# Patient Record
Sex: Female | Born: 1935 | Race: Black or African American | Hispanic: No | Smoking: Never smoker
Health system: Southern US, Community
[De-identification: ages and names within clinical notes are randomized; demographics above are authoritative.]

## PROBLEM LIST (undated history)

## (undated) DIAGNOSIS — F329 Major depressive disorder, single episode, unspecified: Secondary | ICD-10-CM

## (undated) DIAGNOSIS — K5900 Constipation, unspecified: Secondary | ICD-10-CM

## (undated) DIAGNOSIS — N183 Chronic kidney disease, stage 3 unspecified: Secondary | ICD-10-CM

## (undated) DIAGNOSIS — I471 Supraventricular tachycardia: Secondary | ICD-10-CM

## (undated) DIAGNOSIS — Z87440 Personal history of urinary (tract) infections: Secondary | ICD-10-CM

## (undated) DIAGNOSIS — I442 Atrioventricular block, complete: Secondary | ICD-10-CM

## (undated) DIAGNOSIS — Z91148 Patient's other noncompliance with medication regimen for other reason: Secondary | ICD-10-CM

## (undated) DIAGNOSIS — I779 Disorder of arteries and arterioles, unspecified: Secondary | ICD-10-CM

## (undated) DIAGNOSIS — I5032 Chronic diastolic (congestive) heart failure: Secondary | ICD-10-CM

## (undated) DIAGNOSIS — R9430 Abnormal result of cardiovascular function study, unspecified: Secondary | ICD-10-CM

## (undated) DIAGNOSIS — I4719 Other supraventricular tachycardia: Secondary | ICD-10-CM

## (undated) DIAGNOSIS — I739 Peripheral vascular disease, unspecified: Secondary | ICD-10-CM

## (undated) DIAGNOSIS — I10 Essential (primary) hypertension: Secondary | ICD-10-CM

## (undated) DIAGNOSIS — K219 Gastro-esophageal reflux disease without esophagitis: Secondary | ICD-10-CM

## (undated) DIAGNOSIS — E785 Hyperlipidemia, unspecified: Secondary | ICD-10-CM

## (undated) DIAGNOSIS — IMO0002 Reserved for concepts with insufficient information to code with codable children: Secondary | ICD-10-CM

## (undated) DIAGNOSIS — F32A Depression, unspecified: Secondary | ICD-10-CM

## (undated) DIAGNOSIS — I2510 Atherosclerotic heart disease of native coronary artery without angina pectoris: Secondary | ICD-10-CM

## (undated) DIAGNOSIS — I639 Cerebral infarction, unspecified: Secondary | ICD-10-CM

## (undated) DIAGNOSIS — Z9114 Patient's other noncompliance with medication regimen: Secondary | ICD-10-CM

## (undated) DIAGNOSIS — I340 Nonrheumatic mitral (valve) insufficiency: Secondary | ICD-10-CM

## (undated) DIAGNOSIS — R609 Edema, unspecified: Secondary | ICD-10-CM

## (undated) DIAGNOSIS — R0602 Shortness of breath: Secondary | ICD-10-CM

## (undated) DIAGNOSIS — Z8669 Personal history of other diseases of the nervous system and sense organs: Secondary | ICD-10-CM

## (undated) DIAGNOSIS — I951 Orthostatic hypotension: Secondary | ICD-10-CM

## (undated) HISTORY — DX: Orthostatic hypotension: I95.1

## (undated) HISTORY — DX: Edema, unspecified: R60.9

## (undated) HISTORY — DX: Hyperlipidemia, unspecified: E78.5

## (undated) HISTORY — DX: Peripheral vascular disease, unspecified: I73.9

## (undated) HISTORY — DX: Shortness of breath: R06.02

## (undated) HISTORY — DX: Gastro-esophageal reflux disease without esophagitis: K21.9

## (undated) HISTORY — DX: Personal history of urinary (tract) infections: Z87.440

## (undated) HISTORY — DX: Chronic kidney disease, stage 3 unspecified: N18.30

## (undated) HISTORY — DX: Major depressive disorder, single episode, unspecified: F32.9

## (undated) HISTORY — DX: Patient's other noncompliance with medication regimen: Z91.14

## (undated) HISTORY — DX: Depression, unspecified: F32.A

## (undated) HISTORY — DX: Chronic diastolic (congestive) heart failure: I50.32

## (undated) HISTORY — DX: Cerebral infarction, unspecified: I63.9

## (undated) HISTORY — DX: Chronic kidney disease, stage 3 (moderate): N18.3

## (undated) HISTORY — DX: Personal history of other diseases of the nervous system and sense organs: Z86.69

## (undated) HISTORY — DX: Abnormal result of cardiovascular function study, unspecified: R94.30

## (undated) HISTORY — PX: PACEMAKER INSERTION: SHX728

## (undated) HISTORY — DX: Essential (primary) hypertension: I10

## (undated) HISTORY — DX: Patient's other noncompliance with medication regimen for other reason: Z91.148

## (undated) HISTORY — DX: Constipation, unspecified: K59.00

## (undated) HISTORY — DX: Disorder of arteries and arterioles, unspecified: I77.9

## (undated) HISTORY — DX: Reserved for concepts with insufficient information to code with codable children: IMO0002

---

## 2002-02-27 ENCOUNTER — Encounter: Payer: Self-pay | Admitting: Internal Medicine

## 2002-02-28 ENCOUNTER — Ambulatory Visit (HOSPITAL_COMMUNITY): Admission: RE | Admit: 2002-02-28 | Discharge: 2002-02-28 | Payer: Self-pay | Admitting: Internal Medicine

## 2002-02-28 ENCOUNTER — Encounter: Payer: Self-pay | Admitting: Internal Medicine

## 2002-06-11 ENCOUNTER — Ambulatory Visit (HOSPITAL_COMMUNITY): Admission: RE | Admit: 2002-06-11 | Discharge: 2002-06-11 | Payer: Self-pay | Admitting: Ophthalmology

## 2005-11-03 ENCOUNTER — Encounter: Admission: RE | Admit: 2005-11-03 | Discharge: 2005-11-03 | Payer: Self-pay | Admitting: General Surgery

## 2005-11-03 ENCOUNTER — Encounter (INDEPENDENT_AMBULATORY_CARE_PROVIDER_SITE_OTHER): Payer: Self-pay | Admitting: *Deleted

## 2006-12-14 ENCOUNTER — Encounter: Admission: RE | Admit: 2006-12-14 | Discharge: 2006-12-14 | Payer: Self-pay | Admitting: General Surgery

## 2008-01-01 ENCOUNTER — Encounter: Admission: RE | Admit: 2008-01-01 | Discharge: 2008-01-01 | Payer: Self-pay | Admitting: Family Medicine

## 2010-03-23 ENCOUNTER — Ambulatory Visit: Payer: Self-pay | Admitting: Cardiology

## 2011-02-25 NOTE — Procedures (Signed)
[facility]  Patient:    Becky Collins, Becky Collins Visit Number: [id number] MRN: [id number]          Service Type: OUT Location: RAD Attending Physician:  Pricilla Riffle Dictated by:   Vania Rea. Rinehuls, P.A. Proc. Date: 02/28/02 Admit Date:  02/28/2002 Discharge Date: 02/28/2002   CC:         Dr. Carlynn Spry, [place]   Stress Test  DATE OF BIRTH:  November 01, 1931  PROCEDURE:  Exercise Cardiolite stress test.  CARDIOLOGIST:  Dietrich Pates, M.D.  INDICATIONS:  This is a 77 year old female with a history of a fast heart rate.  She states she wore a monitor approximately one year ago for an evaluation, but there were no significant findings.  She has a history of diabetes mellitus, hypertension, elevated cholesterol levels, and a positive family history for coronary artery disease.  She has had occasional chest pain for three to four years.  She notices the pain mainly when she is upset about family issues or other stresses in her life.  She is able to talk on a treadmill at home without pain, although she does become tired quickly.  PAST MEDICAL HISTORY:  Significant for seizures.  Her last one was four to five years ago.  She has a history of renal calculi.  Other history as noted above.  ALLERGIES:  No known drug allergies.  CURRENT MEDICATIONS:  The patient is not sure of her medications.  She believes she is on Depakote, aspirin, Glucophage, and a blood pressure pill.  FAMILY HISTORY:  The patients father died in his 16s from a myocardial infarction and a CVA.  Her mother died from a CVA in her 70s.  She has 18 brothers and sisters, three of whom have significant coronary artery disease.  PHYSICAL EXAMINATION  GENERAL:  A pleasant 77 year old black female, in no acute distress.  VITAL SIGNS:  Blood pressure 140/80, pulse 93.  HEENT:  Unremarkable.  NECK:  No bruits, no jugular venous distention.  HEART:  A regular rate and rhythm without  murmur.  LUNGS:  Clear.  ABDOMEN:  Soft, nontender.  EXTREMITIES:  No edema.  SOCIAL HISTORY:  The patient is married.  She lives in [place].  She has four children.  She cares for other family members who are ill.  She walks on a treadmill as well as walks outside for exercise.  She has never used tobacco or alcohol.  Prior to the study today, the patient had no complaints of chest pain.  Her baseline electrocardiogram showed a sinus rhythm, rate 93 beats per minute, with nonspecific changes.  Her blood pressure was 140/88, pulse 93, target heart rate 130.  DESCRIPTION OF PROCEDURE:  The patient was able to exercise for a total of three minutes, reaching a maximum heart rate of 144 beats per minute.  She was injected at one minute and 30 seconds into the study, at which time her heart rate was 135.  She had no chest tightness.  She did feel tired.  There were no significant electrocardiogram changes, except for occasional PVCs, and while in recovery she developed an abnormal rhythm that appeared to possibly be an accelerated junctional rhythm.  This appeared to be resolving in recovery. The patient was asymptomatic with this rhythm.  Final images are pending at the time of this dictation. Dictated by:   Vania Rea. Rinehuls, P.A. Attending Physician:  Pricilla Riffle DD:  02/28/02 TD:  03/02/02 Job: [id number]

## 2011-08-12 DIAGNOSIS — R072 Precordial pain: Secondary | ICD-10-CM

## 2011-08-12 DIAGNOSIS — R0602 Shortness of breath: Secondary | ICD-10-CM

## 2011-08-12 DIAGNOSIS — R9431 Abnormal electrocardiogram [ECG] [EKG]: Secondary | ICD-10-CM

## 2011-09-13 ENCOUNTER — Encounter: Payer: Self-pay | Admitting: *Deleted

## 2011-09-13 ENCOUNTER — Encounter: Payer: Medicare Other | Admitting: Cardiology

## 2011-09-15 ENCOUNTER — Ambulatory Visit (INDEPENDENT_AMBULATORY_CARE_PROVIDER_SITE_OTHER): Payer: Medicare Other | Admitting: Physician Assistant

## 2011-09-15 ENCOUNTER — Encounter: Payer: Self-pay | Admitting: Cardiology

## 2011-09-15 DIAGNOSIS — I779 Disorder of arteries and arterioles, unspecified: Secondary | ICD-10-CM | POA: Insufficient documentation

## 2011-09-15 DIAGNOSIS — I499 Cardiac arrhythmia, unspecified: Secondary | ICD-10-CM

## 2011-09-15 DIAGNOSIS — I10 Essential (primary) hypertension: Secondary | ICD-10-CM

## 2011-09-15 DIAGNOSIS — E785 Hyperlipidemia, unspecified: Secondary | ICD-10-CM

## 2011-09-15 DIAGNOSIS — E119 Type 2 diabetes mellitus without complications: Secondary | ICD-10-CM

## 2011-09-15 NOTE — Assessment & Plan Note (Signed)
No evidence of recurrent higher grade heart block (Mobitz 1), by current EKG, which indicates underlying first-degree AV block. Would continue current medication regimen, and not resume verapamil. No further workup for dysrhythmia, and we'll schedule return visit with Dr. Andee Lineman in 6 months.

## 2011-09-15 NOTE — Assessment & Plan Note (Signed)
Followed by Dr. Howard. 

## 2011-09-15 NOTE — Progress Notes (Signed)
Cc: Dysrhythmia   HPI: Patient presents for post hospital followup, following recent evaluation for possible CHB. She presented with no known history of CAD, but numerous cardiac risk factors, and history of normal LVF and prior negative stress tests.  She did not have true third degree AVB, but rather was in a second-degree Mobitz 1 pattern. We recommended discontinuing verapamil. 2-D echo indicated normal LVF. We also recommended a nuclear imaging study for risk stratification. Although this yielded no definite evidence of ischemia, there was a medium sized fixed defect, consistent with prior MI; EF 54%. We also ordered carotid Dopplers, given prior history of mild disease, and these yielded 50-69% bilateral ICA (L. greater R.) stenosis, somewhat more pronounced than in previous study, in 2006. Serial cardiac markers were negative for MI, TSH was normal.  Clinically, she denies exertional CP; however, she continues to have exertional dyspnea. She denies taking palpitations.  PMH: reviewed and listed in Problem List in electronic Records (and see below)  Allergies/SH/FH: available in Electronic Records for review  Current Outpatient Prescriptions  Medication Sig Dispense Refill  . aspirin 81 MG tablet Take 81 mg by mouth daily.        . divalproex (DEPAKOTE ER) 250 MG 24 hr tablet Take 250 mg by mouth 2 (two) times daily.        . furosemide (LASIX) 40 MG tablet Take 40 mg by mouth daily.        Marland Kitchen glipiZIDE (GLUCOTROL) 10 MG tablet Take 20 mg by mouth 2 (two) times daily before a meal.       . insulin glargine (LANTUS) 100 UNIT/ML injection Inject into the skin at bedtime.        Marland Kitchen lisinopril (PRINIVIL,ZESTRIL) 20 MG tablet Take 20 mg by mouth daily.        Marland Kitchen LORazepam (ATIVAN) 0.5 MG tablet Take 0.5 mg by mouth every 8 (eight) hours.        Marland Kitchen omeprazole (PRILOSEC) 20 MG capsule Take 20 mg by mouth daily.        . sertraline (ZOLOFT) 50 MG tablet Take 50 mg by mouth daily.        .  Sulfamethoxazole-Trimethoprim (SEPTRA PO) Take 1 tablet by mouth daily.          ROS: no nausea, vomiting; no fever, chills; no melena, hematochezia; no claudication  PHYSICAL EXAM:  BP 150/73  Pulse 100  Resp 16  Ht 5\' 6"  (1.676 m)  Wt 204 lb (92.534 kg)  BMI 32.93 kg/m2 GENERAL: 77 year old female, obese, sitting upright; NAD HEENT: NCAT, PERRLA, EOMI; sclera clear; no xanthelasma NECK: palpable bilateral carotid pulses, bilateral bruits (L. greater than R.); no JVD; no TM LUNGS: CTA bilaterally CARDIAC: RRR (S1, S2); no significant murmurs; no rubs or gallops ABDOMEN: soft, non-tender; intact BS EXTREMETIES: intact distal pulses; no significant peripheral edema SKIN: warm/dry; no obvious rash/lesions MUSCULOSKELETAL: no joint deformity NEURO: no focal deficit; NL affect   EKG: reviewed and available in Electronic Records   ASSESSMENT & PLAN:

## 2011-09-15 NOTE — Patient Instructions (Signed)
Your physician wants you to follow-up in: 6 months. You will receive a reminder letter in the mail one-two months in advance. If you don't receive a letter, please call our office to schedule the follow-up appointment. Your physician recommends that you continue on your current medications as directed. Please refer to the Current Medication list given to you today. 

## 2012-05-07 ENCOUNTER — Encounter: Payer: Self-pay | Admitting: Vascular Surgery

## 2012-05-08 ENCOUNTER — Encounter: Payer: Self-pay | Admitting: Vascular Surgery

## 2012-05-08 ENCOUNTER — Ambulatory Visit (INDEPENDENT_AMBULATORY_CARE_PROVIDER_SITE_OTHER): Payer: Medicare Other | Admitting: Vascular Surgery

## 2012-05-08 VITALS — BP 112/70 | HR 99 | Resp 16 | Ht 65.0 in | Wt 189.0 lb

## 2012-05-08 DIAGNOSIS — I70219 Atherosclerosis of native arteries of extremities with intermittent claudication, unspecified extremity: Secondary | ICD-10-CM

## 2012-05-08 NOTE — Progress Notes (Signed)
The patient has today for evaluation of potential arterial insufficiency in her left foot. She had an episode of erythema in her great toe on the left and pain and is seen for further evaluation. She does walk but is somewhat limited in her ability to get around for multiple other comorbidities. She does have a constant pain in her feet more so on the left than on the right she describes as in her heel but does extend up into her leg as well. She does not have any claudication type symptoms.  Past Medical History  Diagnosis Date  . Hypertension     moderate left ventricular hypertrophy  . Diabetes mellitus     h/o negative exercise stress test/normal LVF assessed June 2011 by echo EF 60-65%  . Hyperlipidemia   . Carotid artery disease     with less than 60% stenosis on the right in 2006  . History of seizure disorder     on depakote  . History of recurrent UTIs   . Chronic edema   . Depression   . Acid reflux disease   . History of medication noncompliance     this has been ongoing for quite sometime  . 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 Dysrhythmia     Second-degree AVB Mobitz 1, 11/12; history of PAT  . Chronic kidney disease   . Stroke     History  Substance Use Topics  . Smoking status: Never Smoker   . Smokeless tobacco: Never Used  . Alcohol Use: No    Family History  Problem Relation Age of Onset  . Heart attack Father   . Heart attack Mother   . Diabetes Sister   . Hypertension Sister   . Peripheral vascular disease Sister   . Diabetes Brother   . Heart disease Brother   . Hypertension Brother     No Known Allergies  Current outpatient prescriptions:aspirin 81 MG tablet, Take 81 mg by mouth daily.  , Disp: , Rfl: ;  divalproex (DEPAKOTE ER) 250 MG 24 hr tablet, Take 250 mg by mouth 2 (two) times daily.  , Disp: , Rfl: ;  furosemide (LASIX) 40 MG tablet, Take 40 mg by mouth daily.  , Disp: , Rfl: ;  gabapentin (NEURONTIN) 100 MG  capsule, Take 100 mg by mouth daily., Disp: , Rfl:  glyBURIDE (DIABETA) 5 MG tablet, Take 5 mg by mouth daily with breakfast., Disp: , Rfl: ;  insulin glargine (LANTUS) 100 UNIT/ML injection, Inject into the skin at bedtime. As directed, Disp: , Rfl: ;  lisinopril (PRINIVIL,ZESTRIL) 20 MG tablet, Take 20 mg by mouth daily.  , Disp: , Rfl: ;  LORazepam (ATIVAN) 0.5 MG tablet, Take 0.5 mg by mouth every 8 (eight) hours.  , Disp: , Rfl:  omeprazole (PRILOSEC) 20 MG capsule, Take 20 mg by mouth daily.  , Disp: , Rfl: ;  sertraline (ZOLOFT) 50 MG tablet, Take 50 mg by mouth daily.  , Disp: , Rfl: ;  Sulfamethoxazole-Trimethoprim (SEPTRA PO), Take 1 tablet by mouth daily.  , Disp: , Rfl: ;  glipiZIDE (GLUCOTROL) 10 MG tablet, Take 20 mg by mouth 2 (two) times daily before a meal. , Disp: , Rfl:   BP 112/70  Pulse 99  Resp 16  Ht 5\' 5"  (1.651 m)  Wt 189 lb (85.73 kg)  BMI 31.45 kg/m2  SpO2 100%  Body mass index is 31.45 kg/(m^2).  View of systems positive shortness of breath with lying flat varicosities in her legs and leg swelling also leg weakness. Otherwise review of systems negative  Physical exam well-developed obese female in no acute distress Heart regular rate and rhythm Carotid arteries without bruits bilaterally Pulse status 2+ radial and 2+ femoral pulses. I do not palpate pedal pulses Chest clear bilaterally without wheezes Abdomen obese with no tenderness noted Skin with no ulcerations specifically no erythema or other tissue loss on her feet. He area where she had erythema of her left great toe is resolved Her logically she is grossly intact  Vascular lab studies from an outlying hospital reviewed from eating. This reveals ankle arm index of 0.76 on the left and 0.85 on the right.  Impression and plan mild arterial insufficiency bilaterally. I do not feel this is causing any threat for tissue loss or rest pain from an arterial standpoint. She does have diffuse pain in her  knees and legs bilaterally I feel this is probably more likely arthritic. She was removed reassured this discussion as was her family members present. I do not feel that she requires any further evaluation or treatment regarding her mild arterial insufficiency.

## 2013-05-20 ENCOUNTER — Inpatient Hospital Stay (HOSPITAL_COMMUNITY): Payer: Medicare Other

## 2013-05-20 ENCOUNTER — Encounter (HOSPITAL_COMMUNITY): Payer: Self-pay | Admitting: Internal Medicine

## 2013-05-20 ENCOUNTER — Inpatient Hospital Stay (HOSPITAL_COMMUNITY)
Admission: AD | Admit: 2013-05-20 | Discharge: 2013-05-27 | DRG: 683 | Disposition: A | Discharge status: Skilled Nursing Facility | Payer: Medicare Other | Source: Other Acute Inpatient Hospital | Attending: Internal Medicine | Admitting: Internal Medicine

## 2013-05-20 DIAGNOSIS — I499 Cardiac arrhythmia, unspecified: Secondary | ICD-10-CM

## 2013-05-20 DIAGNOSIS — I10 Essential (primary) hypertension: Secondary | ICD-10-CM

## 2013-05-20 DIAGNOSIS — R8290 Unspecified abnormal findings in urine: Secondary | ICD-10-CM

## 2013-05-20 DIAGNOSIS — I059 Rheumatic mitral valve disease, unspecified: Secondary | ICD-10-CM | POA: Diagnosis present

## 2013-05-20 DIAGNOSIS — F329 Major depressive disorder, single episode, unspecified: Secondary | ICD-10-CM | POA: Diagnosis present

## 2013-05-20 DIAGNOSIS — E1069 Type 1 diabetes mellitus with other specified complication: Secondary | ICD-10-CM | POA: Diagnosis present

## 2013-05-20 DIAGNOSIS — N390 Urinary tract infection, site not specified: Secondary | ICD-10-CM | POA: Diagnosis present

## 2013-05-20 DIAGNOSIS — I441 Atrioventricular block, second degree: Secondary | ICD-10-CM | POA: Diagnosis present

## 2013-05-20 DIAGNOSIS — N179 Acute kidney failure, unspecified: Principal | ICD-10-CM

## 2013-05-20 DIAGNOSIS — I498 Other specified cardiac arrhythmias: Secondary | ICD-10-CM | POA: Diagnosis present

## 2013-05-20 DIAGNOSIS — E119 Type 2 diabetes mellitus without complications: Secondary | ICD-10-CM

## 2013-05-20 DIAGNOSIS — G40909 Epilepsy, unspecified, not intractable, without status epilepticus: Secondary | ICD-10-CM | POA: Diagnosis present

## 2013-05-20 DIAGNOSIS — N189 Chronic kidney disease, unspecified: Secondary | ICD-10-CM | POA: Diagnosis present

## 2013-05-20 DIAGNOSIS — Z79899 Other long term (current) drug therapy: Secondary | ICD-10-CM

## 2013-05-20 DIAGNOSIS — Z833 Family history of diabetes mellitus: Secondary | ICD-10-CM

## 2013-05-20 DIAGNOSIS — Z8249 Family history of ischemic heart disease and other diseases of the circulatory system: Secondary | ICD-10-CM

## 2013-05-20 DIAGNOSIS — F3289 Other specified depressive episodes: Secondary | ICD-10-CM | POA: Diagnosis present

## 2013-05-20 DIAGNOSIS — K219 Gastro-esophageal reflux disease without esophagitis: Secondary | ICD-10-CM | POA: Diagnosis present

## 2013-05-20 DIAGNOSIS — I70219 Atherosclerosis of native arteries of extremities with intermittent claudication, unspecified extremity: Secondary | ICD-10-CM

## 2013-05-20 DIAGNOSIS — Z8673 Personal history of transient ischemic attack (TIA), and cerebral infarction without residual deficits: Secondary | ICD-10-CM

## 2013-05-20 DIAGNOSIS — B9689 Other specified bacterial agents as the cause of diseases classified elsewhere: Secondary | ICD-10-CM | POA: Diagnosis present

## 2013-05-20 DIAGNOSIS — E871 Hypo-osmolality and hyponatremia: Secondary | ICD-10-CM | POA: Diagnosis present

## 2013-05-20 DIAGNOSIS — E875 Hyperkalemia: Secondary | ICD-10-CM | POA: Diagnosis present

## 2013-05-20 DIAGNOSIS — R259 Unspecified abnormal involuntary movements: Secondary | ICD-10-CM

## 2013-05-20 DIAGNOSIS — Z1639 Resistance to other specified antimicrobial drug: Secondary | ICD-10-CM | POA: Diagnosis present

## 2013-05-20 DIAGNOSIS — Z794 Long term (current) use of insulin: Secondary | ICD-10-CM

## 2013-05-20 DIAGNOSIS — E785 Hyperlipidemia, unspecified: Secondary | ICD-10-CM | POA: Diagnosis present

## 2013-05-20 DIAGNOSIS — E162 Hypoglycemia, unspecified: Secondary | ICD-10-CM

## 2013-05-20 DIAGNOSIS — Z91199 Patient's noncompliance with other medical treatment and regimen due to unspecified reason: Secondary | ICD-10-CM

## 2013-05-20 DIAGNOSIS — I129 Hypertensive chronic kidney disease with stage 1 through stage 4 chronic kidney disease, or unspecified chronic kidney disease: Secondary | ICD-10-CM | POA: Diagnosis present

## 2013-05-20 DIAGNOSIS — I951 Orthostatic hypotension: Secondary | ICD-10-CM | POA: Diagnosis present

## 2013-05-20 DIAGNOSIS — Z9119 Patient's noncompliance with other medical treatment and regimen: Secondary | ICD-10-CM

## 2013-05-20 DIAGNOSIS — Z7982 Long term (current) use of aspirin: Secondary | ICD-10-CM

## 2013-05-20 LAB — GLUCOSE, CAPILLARY: Glucose-Capillary: 88 mg/dL (ref 70–99)

## 2013-05-20 LAB — MRSA PCR SCREENING: MRSA by PCR: NEGATIVE

## 2013-05-20 MED ORDER — LORAZEPAM 0.5 MG PO TABS
0.5000 mg | ORAL_TABLET | Freq: Every day | ORAL | Status: DC | PRN
  Administered 2013-05-21 (×2): 0.5 mg via ORAL
  Filled 2013-05-20 (×2): qty 1

## 2013-05-20 MED ORDER — ONDANSETRON HCL 4 MG/2ML IJ SOLN: 4.0000 mg | Freq: Four times a day (QID) | INTRAMUSCULAR | Status: DC | PRN

## 2013-05-20 MED ORDER — INSULIN GLARGINE 100 UNIT/ML ~~LOC~~ SOLN
20.0000 [IU] | Freq: Two times a day (BID) | SUBCUTANEOUS | Status: DC
  Filled 2013-05-20 (×3): qty 0.2

## 2013-05-20 MED ORDER — HEPARIN SODIUM (PORCINE) 5000 UNIT/ML IJ SOLN
5000.0000 [IU] | Freq: Three times a day (TID) | INTRAMUSCULAR | Status: DC
  Administered 2013-05-20 – 2013-05-23 (×8): 5000 [IU] via SUBCUTANEOUS
  Filled 2013-05-20 (×11): qty 1

## 2013-05-20 MED ORDER — ACETAMINOPHEN 650 MG RE SUPP: 650.0000 mg | Freq: Four times a day (QID) | RECTAL | Status: DC | PRN

## 2013-05-20 MED ORDER — ASPIRIN 81 MG PO TABS: 81.0000 mg | ORAL_TABLET | Freq: Every day | ORAL | Status: DC

## 2013-05-20 MED ORDER — ONDANSETRON HCL 4 MG PO TABS: 4.0000 mg | ORAL_TABLET | Freq: Four times a day (QID) | ORAL | Status: DC | PRN

## 2013-05-20 MED ORDER — SODIUM CHLORIDE 0.9 % IJ SOLN
3.0000 mL | Freq: Two times a day (BID) | INTRAMUSCULAR | Status: DC
  Administered 2013-05-20 – 2013-05-27 (×11): 3 mL via INTRAVENOUS

## 2013-05-20 MED ORDER — SODIUM CHLORIDE 0.9 % IV SOLN
INTRAVENOUS | Status: AC
  Administered 2013-05-20 – 2013-05-21 (×2): via INTRAVENOUS

## 2013-05-20 MED ORDER — SODIUM CHLORIDE 0.9 % IV BOLUS (SEPSIS)
500.0000 mL | Freq: Once | INTRAVENOUS | Status: AC
  Administered 2013-05-20: 500 mL via INTRAVENOUS

## 2013-05-20 MED ORDER — ASPIRIN EC 81 MG PO TBEC
81.0000 mg | DELAYED_RELEASE_TABLET | Freq: Every day | ORAL | Status: DC
  Administered 2013-05-20 – 2013-05-26 (×7): 81 mg via ORAL
  Filled 2013-05-20 (×8): qty 1

## 2013-05-20 MED ORDER — ACETAMINOPHEN 325 MG PO TABS
650.0000 mg | ORAL_TABLET | Freq: Four times a day (QID) | ORAL | Status: DC | PRN
  Administered 2013-05-21 – 2013-05-25 (×4): 650 mg via ORAL
  Filled 2013-05-20 (×4): qty 2

## 2013-05-20 MED ORDER — PANTOPRAZOLE SODIUM 40 MG PO TBEC
40.0000 mg | DELAYED_RELEASE_TABLET | Freq: Every day | ORAL | Status: DC
  Administered 2013-05-21 – 2013-05-27 (×7): 40 mg via ORAL
  Filled 2013-05-20 (×7): qty 1

## 2013-05-20 MED ORDER — SERTRALINE HCL 100 MG PO TABS
100.0000 mg | ORAL_TABLET | Freq: Every day | ORAL | Status: DC
  Administered 2013-05-21 – 2013-05-27 (×7): 100 mg via ORAL
  Filled 2013-05-20 (×7): qty 1

## 2013-05-20 MED ORDER — INSULIN ASPART 100 UNIT/ML ~~LOC~~ SOLN
0.0000 [IU] | Freq: Three times a day (TID) | SUBCUTANEOUS | Status: DC
  Administered 2013-05-21: 2 [IU] via SUBCUTANEOUS
  Administered 2013-05-21: 1 [IU] via SUBCUTANEOUS
  Administered 2013-05-22: 10:00:00 via SUBCUTANEOUS
  Administered 2013-05-22: 2 [IU] via SUBCUTANEOUS
  Administered 2013-05-23: 3 [IU] via SUBCUTANEOUS
  Administered 2013-05-23: 2 [IU] via SUBCUTANEOUS
  Administered 2013-05-23: 5 [IU] via SUBCUTANEOUS
  Administered 2013-05-24 (×3): 3 [IU] via SUBCUTANEOUS
  Administered 2013-05-25 (×3): 2 [IU] via SUBCUTANEOUS
  Administered 2013-05-26 – 2013-05-27 (×5): 3 [IU] via SUBCUTANEOUS

## 2013-05-20 NOTE — Consult Note (Signed)
Cardiology Consult Note   Patient ID: Becky Collins MRN: [id number], DOB/AGE: 13-Aug-1932   Admit date: 05/20/2013 Date of Consult: 05/20/2013  Primary Physician: Becky Flavin, MD Primary Cardiologist: Becky Collins- previously Dr. Andee Collins  Reason for consult: 2:1 AVB   HPI:  Becky Collins is a 79 y.o. female with PMHx s/f h/o 2nd degree Mobitz type 1 AVB, HTN, HLD, type 2 DM, GERD, carotid artery disease, CKD, h/o CVA and h/o seizure disorder who was transferred from [facility] to [facility] hospital with 2:1 Mobitz type 2 heart block.   She was evaluated in 2012 at [facility] for suspected complete heart block. Upon further inspection, the rhythm was determined to be Mobitz type 1 heart block. Verapamil was held with improvement. 2D echo at that time revealed preserved EF. Nuclear stress test indicated no evidence of ischemia, but did reveal a medium sized defect c/w prior infart; EF 54%. Carotid dopplers revealed 50-69% bilateral stenoses. TSH and cardiac markers were WNL at that time.   She reports experiencing weakness and "shaky" today. Said she was unable to stand. Says she usually gets around with walker or cane but was unable to do that She denies syncope, chest pain, shortness of breath and edema. She takes medication as prescribed. Daughter provides assistance.  Denies problems with po intake. Says sugars have been all over the place. No fevers, chills, dysuria, vomitting or diarrhea.  In the ED, EKG and telemetry indicate Wenkebach (Mobitz 1) pattern. Labwork indicated BUN 70/Cr > 4 (baseline Cr 1.7) and K 6.3 which was corrected in the ED.   Problem List: Past Medical History  Diagnosis Date  . Hypertension     moderate left ventricular hypertrophy  . Diabetes mellitus     h/o negative exercise stress test/normal LVF assessed June 2011 by echo EF 60-65%  . Hyperlipidemia   . Carotid artery disease     with less than 60% stenosis on the right in 2006  . History of seizure disorder    on depakote  . History of recurrent UTIs   . Chronic edema   . Depression   . Acid reflux disease   . History of medication noncompliance     this has been ongoing for quite sometime  . 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 Dysrhythmia     Second-degree AVB Mobitz 1, 11/12; history of PAT  . Chronic kidney disease   . Stroke     Past Surgical History  Procedure Laterality Date  . Cesarean section  1973     Allergies: No Known Allergies  Home Medications: Prior to Admission medications   Medication Sig Start Date End Date Taking? Authorizing Provider  aspirin 81 MG tablet Take 81 mg by mouth daily.      Historical Provider, MD  divalproex (DEPAKOTE ER) 250 MG 24 hr tablet Take 250 mg by mouth 2 (two) times daily.      Historical Provider, MD  furosemide (LASIX) 40 MG tablet Take 40 mg by mouth daily.      Historical Provider, MD  gabapentin (NEURONTIN) 100 MG capsule Take 100 mg by mouth daily. 04/25/12   Historical Provider, MD  glipiZIDE (GLUCOTROL) 10 MG tablet Take 20 mg by mouth 2 (two) times daily before a meal.     Historical Provider, MD  glyBURIDE (DIABETA) 5 MG tablet Take 5 mg by mouth daily with breakfast.    Historical Provider, MD  insulin glargine (LANTUS)  100 UNIT/ML injection Inject into the skin at bedtime. As directed    Historical Provider, MD  lisinopril (PRINIVIL,ZESTRIL) 20 MG tablet Take 20 mg by mouth daily.      Historical Provider, MD  LORazepam (ATIVAN) 0.5 MG tablet Take 0.5 mg by mouth every 8 (eight) hours.      Historical Provider, MD  omeprazole (PRILOSEC) 20 MG capsule Take 20 mg by mouth daily.      Historical Provider, MD  sertraline (ZOLOFT) 50 MG tablet Take 50 mg by mouth daily.      Historical Provider, MD  Sulfamethoxazole-Trimethoprim (SEPTRA PO) Take 1 tablet by mouth daily.      Historical Provider, MD    Inpatient Medications:   Prescriptions prior to admission  Medication Sig Dispense Refill  . aspirin 81 MG  tablet Take 81 mg by mouth daily.        . divalproex (DEPAKOTE ER) 250 MG 24 hr tablet Take 250 mg by mouth 2 (two) times daily.        . furosemide (LASIX) 40 MG tablet Take 40 mg by mouth daily.        Marland Kitchen gabapentin (NEURONTIN) 100 MG capsule Take 100 mg by mouth daily.      Marland Kitchen glipiZIDE (GLUCOTROL) 10 MG tablet Take 20 mg by mouth 2 (two) times daily before a meal.       . glyBURIDE (DIABETA) 5 MG tablet Take 5 mg by mouth daily with breakfast.      . insulin glargine (LANTUS) 100 UNIT/ML injection Inject into the skin at bedtime. As directed      . lisinopril (PRINIVIL,ZESTRIL) 20 MG tablet Take 20 mg by mouth daily.        Marland Kitchen LORazepam (ATIVAN) 0.5 MG tablet Take 0.5 mg by mouth every 8 (eight) hours.        Marland Kitchen omeprazole (PRILOSEC) 20 MG capsule Take 20 mg by mouth daily.        . sertraline (ZOLOFT) 50 MG tablet Take 50 mg by mouth daily.        . Sulfamethoxazole-Trimethoprim (SEPTRA PO) Take 1 tablet by mouth daily.          Family History  Problem Relation Age of Onset  . Heart attack Father   . Heart attack Mother   . Diabetes Sister   . Hypertension Sister   . Peripheral vascular disease Sister   . Diabetes Brother   . Heart disease Brother   . Hypertension Brother      History   Social History  . Marital Status: Widowed    Spouse Name: N/A    Number of Children: N/A  . Years of Education: N/A   Occupational History  . Not on file.   Social History Main Topics  . Smoking status: Never Smoker   . Smokeless tobacco: Never Used  . Alcohol Use: No  . Drug Use: No  . Sexually Active: Not on file   Other Topics Concern  . Not on file   Social History Narrative   Husband died recently mid 08/09/2011.     Review of Systems: General: positive for weakness, negative for chills, fever, night sweats or weight changes.  Cardiovascular: negative for chest pain, dyspnea on exertion, edema, orthopnea, palpitations, paroxysmal nocturnal dyspnea or shortness of  breath Dermatological: negative for rash Respiratory: negative for cough or wheezing Urologic: negative for hematuria Abdominal: negative for nausea, vomiting, diarrhea, bright red blood per rectum, melena, or hematemesis Neurologic: positive  for lightheadedness, negative for visual changes, syncope All other systems reviewed and are otherwise negative except as noted above.  Physical Exam:  General: Weak chronically ill appearing Head: Normal  Neck: Supple JVD not elevated. Carotids 2+ bilaterally  Unable to hear any bruits Lungs:  Clear bilaterally to auscultation without wheezes, rales, or rhonchi. Breathing is unlabored. Heart: Distant. Irregular. No obvious murmurs Abdomen:Soft, non-tender, non-distended with normoactive bowel sounds. No hepatomegaly. No rebound/guarding. No obvious abdominal masses. Msk:   Strength and tone appears normal for age. Extremities: Trace bilateral pretibial edema, no clubbing or cyanosis.  Distal pedal pulses are 2+ and equal bilaterally. Neuro:  Alert and oriented X 3. Moves all extremities spontaneously. Psych:  Responds to questions appropriately with a normal affect.  Labs:  pending  Radiology/Studies: No results found.  ASSESSMENT AND PLAN:   79 y.o. female with PMHx s/f h/o 2nd degree Mobitz type 1 AVB, HTN, HLD, type 2 DM, GERD, carotid artery disease, CKD, h/o CVA and h/o seizure disorder who was transferred from [facility] to [facility] hospital with 2:1 Mobitz type 2 heart block.   1. 2nd degree heart block, Mobitz type 1 2. Acute on CKD 3. Hyperkalemia 4. Hypertension 5. Hyperlipidemia 6. Type 2 DM 7. GERD 8. H/o CVA  The patient's rhythm indicates 2nd degree heart block, Mobitz type 1. The patient has a prior history of this which resolved after AVN blocker withdrawal. Aim at improving renal function and continued monitoring on telemetry to restore back to NSR. Hold AVN blockers. Hold nephrotoxic meds including ACEi and diuretics.  Treat hyperkalemia. Hold gabapentin. Further plan as below.    Signed, R. Hurman Horn, PA-C 05/20/2013, 7:44 PM  Patient seen and examined with Hurman Horn, PA-C. We discussed all aspects of the encounter. I agree with the assessment and plan as stated above.   Becky Collins presents with recurrent Wenckebach heart block (Mobitz 1) in the setting of a/c renal failure and hyperkalemia. HR is currently 90 and with SBP 170. No indication for pacing. Agree with holding all AVN blockers and treating hyperkalemia. Management of renal failure and hyperkalemia per hospitalist service. We will continue to follow. Appreciate [facility] care.  Truman Hayward 8:18 PM

## 2013-05-20 NOTE — H&P (Signed)
[facility] History and Physical  Becky Collins YNW:[id number] DOB: 19-Dec-1931 DOA: 05/20/2013  Referring physician: Patient was transferred from [facility] due to arrhythmia. PCP: Becky Flavin, MD   Chief Complaint: Weakness.  HPI: Becky Collins is a 79 y.o. female with history of chronic kidney disease baseline creatinine around 1.7, hypertension, seizures, diabetes mellitus type 2, hyperlipidemia presented to the ER at [facility] due to weakness. Patient states that she's been feeling weak with shaking spells when she tries to walk. Denies any nausea vomiting chest pain abdominal pain shortness of breath diarrhea. Patient had recently found to have increased peripheral edema and patient's primary care increased her Lasix from 40 mg a 60 mg daily. In the ER patient was found to be in Mobitz type II AV block. In addition patient is also found to be in acute renal failure with hyperkalemia. Patient's creatinine was around 4.9 with potassium 6.2. Patient was given Kayexalate 30 g along with calcium gluconate sodium bicarbonate D50 and insulin IV. Patient was initially found to be mildly hypotensive and a 1 L normal saline was given. Patient presently on my exam she looks weak but denies any chest pain. Monitor shows blocks. Patient has been already seen by cardiologist. Patient will be admitted for further management.  Review of Systems: As presented in the history of presenting illness, rest negative.  Past Medical History  Diagnosis Date  . Hypertension     moderate left ventricular hypertrophy  . Diabetes mellitus     h/o negative exercise stress test/normal LVF assessed June 2011 by echo EF 60-65%  . Hyperlipidemia   . Carotid artery disease     with less than 60% stenosis on the right in 2006  . History of seizure disorder     on depakote  . History of recurrent UTIs   . Chronic edema   . Depression   . Acid reflux disease   . History of medication noncompliance      this has been ongoing for quite sometime  . 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 Dysrhythmia     Second-degree AVB Mobitz 1, 11/12; history of PAT  . Chronic kidney disease   . Stroke    Past Surgical History  Procedure Laterality Date  . Cesarean section  1973   Social History:  reports that she has never smoked. She has never used smokeless tobacco. She reports that she does not drink alcohol or use illicit drugs. Home. where does patient live-- Can do ADLs. Can patient participate in ADLs?  No Known Allergies  Family History  Problem Relation Age of Onset  . Heart attack Father   . Heart attack Mother   . Diabetes Sister   . Hypertension Sister   . Peripheral vascular disease Sister   . Diabetes Brother   . Heart disease Brother   . Hypertension Brother       Prior to Admission medications   Medication Sig Start Date End Date Taking? Authorizing Provider  aspirin 81 MG tablet Take 81 mg by mouth at bedtime.    Yes Historical Provider, MD  furosemide (LASIX) 40 MG tablet Take 40 mg by mouth every morning.    Yes Historical Provider, MD  gabapentin (NEURONTIN) 300 MG capsule Take 900 mg by mouth 2 (two) times daily.   Yes Historical Provider, MD  glimepiride (AMARYL) 2 MG tablet Take 4 mg by mouth 2 (two) times daily.  Yes Historical Provider, MD  insulin glargine (LANTUS) 100 UNIT/ML injection Inject 20-60 Units into the skin 2 (two) times daily. 20 units in the am & 60 units in the afternoon   Yes Historical Provider, MD  lisinopril (PRINIVIL,ZESTRIL) 20 MG tablet Take 20 mg by mouth every morning.    Yes Historical Provider, MD  LORazepam (ATIVAN) 0.5 MG tablet Take 0.5 mg by mouth daily as needed. For anxiety   Yes Historical Provider, MD  omeprazole (PRILOSEC) 20 MG capsule Take 20 mg by mouth every morning.    Yes Historical Provider, MD  sertraline (ZOLOFT) 100 MG tablet Take 100 mg by mouth every morning.   Yes Historical Provider, MD   sulfamethoxazole-trimethoprim (BACTRIM DS) 800-160 MG per tablet Take 1 tablet by mouth daily. Daily for UTI prevention   Yes Historical Provider, MD   Physical Exam: There were no vitals filed for this visit.   General:  Well-developed well-nourished.  Eyes: Anicteric no pallor.  ENT: No discharge from ears eyes nose or mouth.  Neck: No mass felt.  Cardiovascular: S1-S2 heard.  Respiratory: No rhonchi or crepitations.  Abdomen: Soft nontender bowel sounds present.  Skin: No rash.  Musculoskeletal: No edema.  Psychiatric: Patient looks lethargic.  Neurologic: Lethargic but responds to questions appropriately. Moves all extremities.  Labs on Admission:  Basic Metabolic Panel: No results found for this basename: NA, K, CL, CO2, GLUCOSE, BUN, CREATININE, CALCIUM, MG, PHOS,  in the last 168 hours Liver Function Tests: No results found for this basename: AST, ALT, ALKPHOS, BILITOT, PROT, ALBUMIN,  in the last 168 hours No results found for this basename: LIPASE, AMYLASE,  in the last 168 hours No results found for this basename: AMMONIA,  in the last 168 hours CBC: No results found for this basename: WBC, NEUTROABS, HGB, HCT, MCV, PLT,  in the last 168 hours Cardiac Enzymes: No results found for this basename: CKTOTAL, CKMB, CKMBINDEX, TROPONINI,  in the last 168 hours  BNP (last 3 results) No results found for this basename: PROBNP,  in the last 8760 hours CBG: No results found for this basename: GLUCAP,  in the last 168 hours  Radiological Exams on Admission: No results found.  EKG: Independently reviewed. Mobitz type 2 AV block. EKG was done at [facility].   Other labs at [facility] showed calcium of 9.5 potassium of 6.2 bicarbonate of 20 creatinine of 4.9 hemoglobin 11.6.  Assessment/Plan Principal Problem:   Mobitz (type) I (Wenckebach's) atrioventricular block Active Problems:   HTN (hypertension)   DM (diabetes mellitus)   Hyperkalemia    Acute on chronic renal failure   1. Acute renal failure on chronic kidney disease with hyperkalemia - probably worsened by recent increase in Lasix. Patient did receive Kayexalate calcium gluconate bicarbonate IV insulin and fluids in the ER at [facility]. We'll hold Lasix, lisinopril. Patient's medication list shows that patient is on Bactrim but patient does not recall being on it. Bactrim will be discontinued. Gently hydrate and closely follow intake output and metabolic panel. Follow urinalysis and renal sonogram. 2. Mobitz type II AV block - per cardiology. 3. Hypertension - presently mildly hypotensive. Hold antihypertensives and hydrate. 4. Diabetes mellitus type 2 - since patient has acute renal failure patient's metritis placed on hold and Lantus dose as been decreased to 20 units twice a day. Closely follow CBC. 5. Seizure disorder - check Depakote levels and LFTs and ammonia as patient is lethargic. 6. Hyperlipidemia - continue present medications.  Code Status: Full code.  Family Communication: Patient's family.  Disposition Plan: Admit to inpatient.    Jerah Esty N. [facility] Pager [phone].  If 7PM-7AM, please contact night-coverage [email or web address] Password [facility] 05/20/2013, 8:56 PM

## 2013-05-20 NOTE — Progress Notes (Signed)
Transfer from [facility] ED. Initially accepted by Dr. Excell Seltzer for 2:1 heart block. Labs came back with acute renal failure and hyperkalemia. Creatinine over 4. BUN 70. Baseline creatinine 1.7. Potassium 6.3. Hyperkalemia reportedly treated in the ED. Admit to 2900, Dr. Excell Seltzer to consult.  Crista Curb, M.D.

## 2013-05-21 ENCOUNTER — Inpatient Hospital Stay (HOSPITAL_COMMUNITY): Payer: Medicare Other

## 2013-05-21 DIAGNOSIS — I059 Rheumatic mitral valve disease, unspecified: Secondary | ICD-10-CM

## 2013-05-21 DIAGNOSIS — E162 Hypoglycemia, unspecified: Secondary | ICD-10-CM

## 2013-05-21 LAB — BASIC METABOLIC PANEL
BUN: 57 mg/dL — ABNORMAL HIGH (ref 6–23)
BUN: 68 mg/dL — ABNORMAL HIGH (ref 6–23)
CO2: 20 mEq/L (ref 19–32)
CO2: 21 mEq/L (ref 19–32)
Calcium: 10.1 mg/dL (ref 8.4–10.5)
Calcium: 9 mg/dL (ref 8.4–10.5)
Chloride: 103 mEq/L (ref 96–112)
Chloride: 103 mEq/L (ref 96–112)
Creatinine, Ser: 4.3 mg/dL — ABNORMAL HIGH (ref 0.50–1.10)
GFR calc Af Amer: 10 mL/min — ABNORMAL LOW (ref >90)
GFR calc Af Amer: 11 mL/min — ABNORMAL LOW (ref >90)
GFR calc non Af Amer: 9 mL/min — ABNORMAL LOW (ref >90)
Glucose, Bld: 152 mg/dL — ABNORMAL HIGH (ref 70–99)
Potassium: 4.6 mEq/L (ref 3.5–5.1)
Potassium: 4.9 mEq/L (ref 3.5–5.1)
Sodium: 138 mEq/L (ref 135–145)
Sodium: 135 mEq/L (ref 135–145)

## 2013-05-21 LAB — URINALYSIS, ROUTINE W REFLEX MICROSCOPIC
Nitrite: POSITIVE — AB
Protein, ur: NEGATIVE mg/dL
Specific Gravity, Urine: 1.012 (ref 1.005–1.030)
Urobilinogen, UA: 0.2 mg/dL (ref 0.0–1.0)

## 2013-05-21 LAB — VALPROIC ACID LEVEL: Valproic Acid Lvl: <10 ug/mL — ABNORMAL LOW (ref 50.0–100.0)

## 2013-05-21 LAB — CBC WITH DIFFERENTIAL/PLATELET
Basophils Absolute: 0 10*3/uL (ref 0.0–0.1)
Basophils Relative: 0 % (ref 0–1)
Eosinophils Relative: 3 % (ref 0–5)
HCT: 33.4 % — ABNORMAL LOW (ref 36.0–46.0)
Lymphocytes Relative: 24 % (ref 12–46)
MCHC: 32.9 g/dL (ref 30.0–36.0)
MCV: 81.3 fL (ref 78.0–100.0)
Monocytes Absolute: 0.7 10*3/uL (ref 0.1–1.0)
Platelets: 231 10*3/uL (ref 150–400)
RDW: 15.3 % (ref 11.5–15.5)
WBC: 5.7 10*3/uL (ref 4.0–10.5)

## 2013-05-21 LAB — HEPATIC FUNCTION PANEL
Bilirubin, Direct: <0.1 mg/dL (ref 0.0–0.3)
Total Bilirubin: 0.2 mg/dL — ABNORMAL LOW (ref 0.3–1.2)

## 2013-05-21 LAB — TSH: TSH: 2.642 u[IU]/mL (ref 0.350–4.500)

## 2013-05-21 LAB — AMMONIA: Ammonia: 32 umol/L (ref 11–60)

## 2013-05-21 LAB — CBC
HCT: 32.5 % — ABNORMAL LOW (ref 36.0–46.0)
Hemoglobin: 10.5 g/dL — ABNORMAL LOW (ref 12.0–15.0)
MCHC: 32.3 g/dL (ref 30.0–36.0)
RBC: 3.98 MIL/uL (ref 3.87–5.11)
WBC: 5.9 10*3/uL (ref 4.0–10.5)

## 2013-05-21 LAB — URINE MICROSCOPIC-ADD ON

## 2013-05-21 LAB — HEMOGLOBIN A1C
Hgb A1c MFr Bld: 10 % — ABNORMAL HIGH (ref <5.7)
Mean Plasma Glucose: 240 mg/dL — ABNORMAL HIGH (ref <117)

## 2013-05-21 LAB — GLUCOSE, CAPILLARY

## 2013-05-21 MED ORDER — AMLODIPINE BESYLATE 5 MG PO TABS
5.0000 mg | ORAL_TABLET | Freq: Every day | ORAL | Status: DC
  Administered 2013-05-21 – 2013-05-24 (×4): 5 mg via ORAL
  Filled 2013-05-21 (×4): qty 1

## 2013-05-21 MED ORDER — WHITE PETROLATUM GEL
Status: AC
  Administered 2013-05-21: 0.2
  Filled 2013-05-21: qty 5

## 2013-05-21 NOTE — Progress Notes (Signed)
Patient ID: Becky Collins, female   DOB: 1932/08/28, 79 y.o.   MRN: [id number]    Subjective:  Denies SSCP, palpitations or Dyspnea   Objective:  Filed Vitals:   05/21/13 0400 05/21/13 0500 05/21/13 0600 05/21/13 0747  BP: 97/29 84/26 100/28   Pulse: 39 30 42   Temp: 97.3 F (36.3 C)   97.7 F (36.5 C)  TempSrc: Oral   Oral  Resp: 16 15 14    Height:      Weight:      SpO2: 98% 98% 93%     Intake/Output from previous day:  Intake/Output Summary (Last 24 hours) at 05/21/13 7829 Last data filed at 05/21/13 0600  Gross per 24 hour  Intake   1870 ml  Output    402 ml  Net   1468 ml    Physical Exam: Affect appropriate Healthy:  appears stated age HEENT: normal Neck supple with no adenopathy JVP normal no bruits no thyromegaly Lungs clear with no wheezing and good diaphragmatic motion Heart:  S1/S2 no murmur, no rub, gallop or click PMI normal Abdomen: benighn, BS positve, no tenderness, no AAA no bruit.  No HSM or HJR Distal pulses intact with no bruits No edema Neuro non-focal Skin warm and dry No muscular weakness   Lab Results: Basic Metabolic Panel:  Recent Labs  56/21/30 2347 05/21/13 0412  NA 136 138  K 5.3* 4.6  CL 103 103  CO2 20 23  GLUCOSE 69* 57*  BUN 68* 67*  CREATININE 4.30* 4.27*  CALCIUM 10.1 9.7   Liver Function Tests:  Recent Labs  05/20/13 2347  AST 27  ALT 17  ALKPHOS 54  BILITOT 0.2*  PROT 6.7  ALBUMIN 3.4*   CBC:  Recent Labs  05/20/13 2347 05/21/13 0412  WBC 5.7 5.9  NEUTROABS 3.4  --   HGB 11.0* 10.5*  HCT 33.4* 32.5*  MCV 81.3 81.7  PLT 231 242    Imaging: Dg Chest Port 1 View  05/20/2013   *RADIOLOGY REPORT*  Clinical Data: Chest discomfort, shortness of breath, history of hypertension  PORTABLE CHEST - 1 VIEW  Comparison: 05/20/2013; 05/08/2013  Findings:  Grossly unchanged cardiac silhouette and mediastinal contours with atherosclerotic calcifications within the thoracic aorta.  The lungs appear  hyperexpanded with flattening of the bilateral hemidiaphragms.  Minimal bibasilar opacities, left greater than right.  No focal airspace opacity.  No definite pleural effusion or pneumothorax.  No definite evidence of edema.  Unchanged bones.  IMPRESSION: Minimal bibasilar atelectasis without definite acute cardiopulmonary disease on this low lung volumes AP portable examination.  Further evaluation with a PA and lateral chest radiograph may be obtained as clinically indicated.   Original Report Authenticated By: Tacey Ruiz, MD    Cardiac Studies:  ECG: 2:1 AV block no ischemia   Telemetry:  2:1 AV block now 78  But mostly in the mid 40's with narrow escape  Echo:   Medications:   . aspirin EC  81 mg Oral QHS  . heparin  5,000 Units Subcutaneous Q8H  . insulin aspart  0-9 Units Subcutaneous TID WC  . insulin glargine  20 Units Subcutaneous BID  . pantoprazole  40 mg Oral Daily  . sertraline  100 mg Oral Daily  . sodium chloride  3 mL Intravenous Q12H     . sodium chloride 125 mL/hr at 05/20/13 2109    Assessment/Plan:  Heart Block:  Asymptomatic now with narrow escape  K improving but renal function  still way above baseline Will continue to monitor  Will ask EP to round on in am CRF:  Hydrate Check echo plan per primary service K now 4.6  Charlton Haws 05/21/2013, 8:33 AM

## 2013-05-21 NOTE — Progress Notes (Signed)
Hypoglycemic Event  CBG:64  Treatment: 4 ozs orange juice  Symptoms: None  Follow-up CBG: Time:0830 CBG Result:90  Possible Reasons for Event: poor po intake  Comments/MD notified:Rizwan    Becky Collins, Kinnie Scales  Remember to initiate Hypoglycemia Order Set & complete

## 2013-05-21 NOTE — Progress Notes (Signed)
  Echocardiogram 2D Echocardiogram has been performed.  Georgian Co 05/21/2013, 12:18 PM

## 2013-05-21 NOTE — Care Management Note (Addendum)
    Page 1 of 1   05/24/2013     3:14:41 PM   CARE MANAGEMENT NOTE 05/24/2013  Patient:  Becky Collins, Becky Collins   Account Number:  [id number]  Date Initiated:  05/21/2013  Documentation initiated by:  Junius Creamer  Subjective/Objective Assessment:   adm w mobitz 2 block, hyperkalemia     Action/Plan:   lives w fam, pcp dr Caryn Bee howard   Anticipated DC Date:     Anticipated DC Plan:        DC Planning Services  CM consult      Choice offered to / List presented to:             Status of service:   Medicare Important Message given?   (If response is "NO", the following Medicare IM given date fields will be blank) Date Medicare IM given:   Date Additional Medicare IM given:    Discharge Disposition:    Per UR Regulation:  Reviewed for med. necessity/level of care/duration of stay  If discussed at Long Length of Stay Meetings, dates discussed:    Comments:  8/15  1512 debbie Ryenn Howeth rn,bsn spoke w pt and son. pt lives w da who has had stroke. she hopes to go home but very weak. hx of snf and hx of hhc. will await phy ther eval for rec. pt states she agreeable to whatever therapy rec.  8/13 1504p debbie Davidlee Jeanbaptiste rn,bsn spoke w pt and fam. pt has pcp but fam wanted inform on geting new phy in [place]. gave pt/[facility]am health connect phone # and also told them they could call customer service w [facility] complete and they can let her know practices in [place]. fam was apreciative.

## 2013-05-21 NOTE — Progress Notes (Signed)
Asked to review medication list by [facility]. Spoke to a nurse at Dr. Jeannette How office - the patient's primary care provider.  She provided me with a medication list per their office but they have recently switched to EMR and the medication lists are not accurate.   I then called [facility] and reviewed the medication list with a Pharmacologist.   Discrepancies were corrected.   1.  Pt is no longer taking Valproic Acid - she has been Sz free for many years and VA was stopped several months ago and she has not had a Sz.  2.  Pt DM medication should be Amaryl as noted on [facility] medication reconciliation. 3. She had an increase in her Furosemide dose 05/08/13 to 1.5 tab daily 4.  Added brimotidine eye drops  Of note there is an increase risk of acute renal failure in patients taking ACEI and Septra, although she has been on this combination for a few years.    Leota Sauers Pharm.D. CPP, BCPS Clinical Pharmacist [phone] 05/21/2013 12:17 PM

## 2013-05-21 NOTE — Progress Notes (Signed)
Dr. Butler Denmark notified of pt's C/O nausea and malaise. Noted episodes correlate with drop in pt's HR to 40's. Orders received.

## 2013-05-21 NOTE — Progress Notes (Signed)
Theodore Demark PA cardiology- notified of discussion with Junious Silk NP pertaining pt's HR and status.

## 2013-05-21 NOTE — Progress Notes (Signed)
[facility] Progress Note [place] TEAM 1 - Stepdown ICU Team   Becky Collins ZOX:[id number] DOB: 23-Oct-1931 DOA: 05/20/2013 PCP: Selinda Flavin, MD  Brief narrative: 79 year old female patient with baseline creatinine 1.7, also known hypertension, ?? seizure disorder and diabetes. Presented to [facility] emergency department with complaints of weakness. No other associated symptoms. Recently evaluated on 05/08/2013 at primary care physician's office for increased peripheral edema, Lasix dosage increased. In the ER patient was found to be in Mobitz type I AV block as well as new acute renal failure with hyperkalemia with a creatinine of 4.9 and potassium 6.2. Patient was given Kayexalate, calcium gluconate, sodium bicarbonate, and dextrose and IV insulin. Patient is also hypotensive so was given 1 L of fluid. Because of the heart block cardiology evaluated the patient and recommended correction of potassium and holding AV nodal blocking agents.  Assessment/Plan: Active Problems:   Mobitz (type) I (Wenckebach's) atrioventricular block -cards managing -persists- 1:1 ratio -holding AVN blocking agents-does not appear was on pre admit since apparently has had issues with HB in past    Hyperkalemia/Acute renal failure -potassium has normalized with med treatment and hydration -Scr still > 4.0- repeat labs in am -renal US normal without medical renal dz or hydronephrosis -OP PCP notes show pt on chronic Bactrim- now on hold 2/2 ARF -suspect combo of ACE I with Bactrim and new increased dose of Lasix cause of ARF    HTN (hypertension) -BP moderately controlled -pre admit ACE I on hold -add IV hydralazine prn and Norvasc (not an AVN blocking agent)    DM (diabetes mellitus) -had issues with hypoglycemia this am likely due to decreased renal clearance -hold for now with SSI only -need to clarify home dose- OP PCP notes 120 units daily which since > 100 units/24 hours should be  divided into 60 units BID    HLD (hyperlipidemia)    Abnormal urinalysis -likely due to ARF and DH  -FU on culture   Seizure disorder -old notes from this facility show pt previously on Depakote- pharmacist confirms with family remote h/o seizures and several months ago was taken off Depakote without emergence of seizures  DVT prophylaxis: subcutaneous heparin Code Status: Full Family Communication: Patient and family Disposition Plan/Expected LOS: Remain in SDU Isolation: None Nutritional Status: Stable   Consultants: Cardiology  Procedures: 2-D echocardiogram - Left ventricle: The cavity size was normal. Wall thickness was normal. Systolic function was normal. The estimated ejection fraction was in the range of 55% to 60%. Wall motion was normal; there were no regional wall motion abnormalities. Left ventricular diastolic function parameters were normal. - Mitral valve: Moderate regurgitation. Impressions: - Patient is in NSR with 2:1 block throughout the study.  Antibiotics: None  HPI/Subjective: Patient alert. Unable to recall all of her home medications. No shortness of breath, chest pain, abdominal pain, has not ambulated since admission.  Objective: Blood pressure 164/49, pulse 55, temperature 98.2 F (36.8 C), temperature source Oral, resp. rate 17, height 5\' 3"  (1.6 m), weight 90.6 kg (199 lb 11.8 oz), SpO2 98.00%.  Intake/Output Summary (Last 24 hours) at 05/21/13 1333 Last data filed at 05/21/13 1300  Gross per 24 hour  Intake   2748 ml  Output   1127 ml  Net   1621 ml     Exam: General: No acute respiratory distress Lungs: Clear to auscultation bilaterally without wheezes or crackles, 2L Cardiovascular: Regular rate and rhythm without murmur gallop or rub normal S1 and S2, 1+  peripheral edema without JVD Abdomen: Nontender, nondistended, soft, bowel sounds positive, no rebound, no ascites, no appreciable mass Musculoskeletal: No significant  cyanosis, clubbing of bilateral lower extremities Neurological: Alert and oriented x 3, moves all extremities x 4 without focal neurological deficits, CN 2-12 intact  Scheduled Meds:  Scheduled Meds: . aspirin EC  81 mg Oral QHS  . heparin  5,000 Units Subcutaneous Q8H  . insulin aspart  0-9 Units Subcutaneous TID WC  . pantoprazole  40 mg Oral Daily  . sertraline  100 mg Oral Daily  . sodium chloride  3 mL Intravenous Q12H   Continuous Infusions: . sodium chloride 125 mL/hr at 05/21/13 1306    **Reviewed in detail by the Attending Physicia  Data Reviewed: Basic Metabolic Panel:  Recent Labs Lab 05/20/13 2347 05/21/13 0412  NA 136 138  K 5.3* 4.6  CL 103 103  CO2 20 23  GLUCOSE 69* 57*  BUN 68* 67*  CREATININE 4.30* 4.27*  CALCIUM 10.1 9.7   Liver Function Tests:  Recent Labs Lab 05/20/13 2347  AST 27  ALT 17  ALKPHOS 54  BILITOT 0.2*  PROT 6.7  ALBUMIN 3.4*   No results found for this basename: LIPASE, AMYLASE,  in the last 168 hours  Recent Labs Lab 05/20/13 2347  AMMONIA 32   CBC:  Recent Labs Lab 05/20/13 2347 05/21/13 0412  WBC 5.7 5.9  NEUTROABS 3.4  --   HGB 11.0* 10.5*  HCT 33.4* 32.5*  MCV 81.3 81.7  PLT 231 242   Cardiac Enzymes: No results found for this basename: CKTOTAL, CKMB, CKMBINDEX, TROPONINI,  in the last 168 hours BNP (last 3 results) No results found for this basename: PROBNP,  in the last 8760 hours CBG:  Recent Labs Lab 05/20/13 2119 05/21/13 0749 05/21/13 0816 05/21/13 1307  GLUCAP 88 64* 90 149*    Recent Results (from the past 240 hour(s))  MRSA PCR SCREENING     Status: None   Collection Time    05/20/13  7:51 PM      Result Value Range Status   MRSA by PCR NEGATIVE  NEGATIVE Final   Comment:            The GeneXpert MRSA Assay (FDA     approved for NASAL specimens     only), is one component of a     comprehensive MRSA colonization     surveillance program. It is not     intended to diagnose MRSA      infection nor to guide or     monitor treatment for     MRSA infections.     Studies:  Recent x-ray studies have been reviewed in detail by the Attending Physician    Junious Silk, ANP [facility] Office  [phone] Pager [phone]  **If unable to reach the above provider after paging please contact the Flow Manager @ [phone]  On-Call/Text Page:      [email or web address]      password [email or web address]  If 7PM-7AM, please contact night-coverage [email or web address] Password [facility] 05/21/2013, 1:33 PM   LOS: 1 day   I have examined the patient, reviewed the chart and modified the above note which I agree with.   Hady Niemczyk,MD [phone] 05/21/2013, 7:31 PM

## 2013-05-22 ENCOUNTER — Encounter (HOSPITAL_COMMUNITY): Payer: Self-pay

## 2013-05-22 LAB — BASIC METABOLIC PANEL
BUN: 47 mg/dL — ABNORMAL HIGH (ref 6–23)
BUN: 36 mg/dL — ABNORMAL HIGH (ref 6–23)
CO2: 19 mEq/L (ref 19–32)
Calcium: 9.3 mg/dL (ref 8.4–10.5)
Chloride: 108 mEq/L (ref 96–112)
Creatinine, Ser: 2.19 mg/dL — ABNORMAL HIGH (ref 0.50–1.10)
GFR calc non Af Amer: 20 mL/min — ABNORMAL LOW (ref >90)
Glucose, Bld: 151 mg/dL — ABNORMAL HIGH (ref 70–99)
Glucose, Bld: 222 mg/dL — ABNORMAL HIGH (ref 70–99)
Potassium: 5.5 mEq/L — ABNORMAL HIGH (ref 3.5–5.1)
Sodium: 135 mEq/L (ref 135–145)

## 2013-05-22 LAB — GLUCOSE, CAPILLARY

## 2013-05-22 LAB — CBC
HCT: 32.3 % — ABNORMAL LOW (ref 36.0–46.0)
Hemoglobin: 10.2 g/dL — ABNORMAL LOW (ref 12.0–15.0)
MCH: 26.4 pg (ref 26.0–34.0)
MCHC: 31.6 g/dL (ref 30.0–36.0)

## 2013-05-22 MED ORDER — DEXTROSE 5 % IV SOLN
1.0000 g | INTRAVENOUS | Status: DC
  Administered 2013-05-22 – 2013-05-27 (×6): 1 g via INTRAVENOUS
  Filled 2013-05-22 (×7): qty 10

## 2013-05-22 MED ORDER — PHENAZOPYRIDINE HCL 100 MG PO TABS: 100.0000 mg | ORAL_TABLET | Freq: Three times a day (TID) | ORAL | Status: DC

## 2013-05-22 MED ORDER — MORPHINE SULFATE 2 MG/ML IJ SOLN
1.0000 mg | INTRAMUSCULAR | Status: DC | PRN
  Filled 2013-05-22: qty 1

## 2013-05-22 MED ORDER — OXYCODONE HCL 5 MG PO TABS
5.0000 mg | ORAL_TABLET | ORAL | Status: DC | PRN
  Administered 2013-05-22: 10 mg via ORAL
  Filled 2013-05-22: qty 2

## 2013-05-22 NOTE — Progress Notes (Signed)
wil continue to watch heart rate as K normalizes and Cr improves.  depakote rarely can cause bradyccardia, but that this was  A problem 2 years ago does not bode well for recovery Tele today confirms av nodal block, but with symptoms it may not matter

## 2013-05-22 NOTE — Progress Notes (Signed)
[facility] Progress Note [place] TEAM 1 - Stepdown ICU Team   Becky Collins QMV:[id number] DOB: Jan 14, 1932 DOA: 05/20/2013 PCP: Selinda Flavin, MD  Brief narrative: 79 year old female patient with baseline creatinine 1.7, also known hypertension, ?? seizure disorder and diabetes. Presented to [facility] emergency department with complaints of weakness. No other associated symptoms. Recently evaluated on 05/08/2013 at primary care physician's office for increased peripheral edema, Lasix dosage increased. In the ER patient was found to be in Mobitz type I AV block as well as new acute renal failure with hyperkalemia with a creatinine of 4.9 and potassium 6.2. Patient was given Kayexalate, calcium gluconate, sodium bicarbonate, and dextrose and IV insulin. Patient was also hypotensive so was given 1 L of fluid. Because of the heart block Cardiology evaluated the patient and recommended correction of potassium and holding AV nodal blocking agents.  Assessment/Plan:  Mobitz (type) I (Wenckebach's) atrioventricular block -Cards managing -has been symptomatic with dizziness and near syncope at home -plan is to ensure renal function and electrolytes have normalized before considering PPM -pt reportedly has NOT been on Depakote for several months so doubt this contributing to bradycardia  Acute renal failure with hyperkalemia  -Scr has decreased to 2.6 but K back up to 5.5 - repeat BMET at 5p - if up further will need to treat -renal US normal without medical renal dz or hydronephrosis -OP PCP notes show pt on chronic Bactrim - now on hold 2/2 ARF -suspect combo of ACE I with Bactrim and new increased dose of Lasix cause of ARF  HTN (hypertension) -BP moderately controlled -pre admit ACE I on hold -added IV hydralazine prn and Norvasc - may need to increase Norvasc dose  Moderate MR -found on ECHO this admit -follow  DM  -had issues with hypoglycemia this am likely due to  decreased renal clearance -hold for now with SSI only -need to clarify home dose - OP PCP notes 120 units daily   HLD (hyperlipidemia)  Gram negative rod UTI - > 100,000 colonies GNR's in urine cx - pt describes dysuria when questioned  -begin empiric tx and follow   Seizure disorder -old notes from this facility show pt previously on Depakote - pharmacist confirms with family remote h/o seizures and several months ago was taken off Depakote without recurrence of seizures  DVT prophylaxis: SQ heparin Code Status: FULL Family Communication: Patient and family Disposition Plan/Expected LOS: SDU  Consultants: Cardiology  Procedures: 2-D echocardiogram - Left ventricle: The cavity size was normal. Wall thickness was normal. Systolic function was normal. The estimated ejection fraction was in the range of 55% to 60%. Wall motion was normal; there were no regional wall motion abnormalities. Left ventricular diastolic function parameters were normal. - Mitral valve: Moderate regurgitation. Impressions: - Patient is in NSR with 2:1 block throughout the study.  Antibiotics: Rocephin 8/13 >>  HPI/Subjective: Patient alert and conversant.  Denies n/v, sob, f/c, or cp.  Does c/o dysuria.  Objective: Blood pressure 165/49, pulse 58, temperature 98 F (36.7 C), temperature source Oral, resp. rate 19, height 5\' 3"  (1.6 m), weight 90 kg (198 lb 6.6 oz), SpO2 99.00%.  Intake/Output Summary (Last 24 hours) at 05/22/13 1434 Last data filed at 05/22/13 1107  Gross per 24 hour  Intake   1355 ml  Output   2850 ml  Net  -1495 ml   Exam: General: No acute respiratory distress Lungs: Clear to auscultation bilaterally without wheezes or crackles, 2L Cardiovascular: Regular rate and  rhythm without murmur gallop or rub normal S1 and S2, 1+ peripheral edema without JVD Abdomen: Nontender, nondistended, soft, bowel sounds positive, no rebound, no ascites, no appreciable mass Musculoskeletal:  No significant cyanosis, clubbing of bilateral lower extremities Neurological: Alert and oriented x 3, CN 2-12 intact  Scheduled Meds:  Scheduled Meds: . amLODipine  5 mg Oral Daily  . aspirin EC  81 mg Oral QHS  . cefTRIAXone (ROCEPHIN)  IV  1 g Intravenous Q24H  . heparin  5,000 Units Subcutaneous Q8H  . insulin aspart  0-9 Units Subcutaneous TID WC  . pantoprazole  40 mg Oral Daily  . sertraline  100 mg Oral Daily  . sodium chloride  3 mL Intravenous Q12H   Data Reviewed: Basic Metabolic Panel:  Recent Labs Lab 05/20/13 2347 05/21/13 0412 05/21/13 1705 05/22/13 0410  NA 136 138 135 138  K 5.3* 4.6 4.9 5.5*  CL 103 103 104 108  CO2 20 23 21 23   GLUCOSE 69* 57* 152* 151*  BUN 68* 67* 57* 47*  CREATININE 4.30* 4.27* 3.13* 2.60*  CALCIUM 10.1 9.7 9.0 8.9   Liver Function Tests:  Recent Labs Lab 05/20/13 2347  AST 27  ALT 17  ALKPHOS 54  BILITOT 0.2*  PROT 6.7  ALBUMIN 3.4*    Recent Labs Lab 05/20/13 2347  AMMONIA 32   CBC:  Recent Labs Lab 05/20/13 2347 05/21/13 0412 05/22/13 0410  WBC 5.7 5.9 4.9  NEUTROABS 3.4  --   --   HGB 11.0* 10.5* 10.2*  HCT 33.4* 32.5* 32.3*  MCV 81.3 81.7 83.5  PLT 231 242 207   CBG:  Recent Labs Lab 05/21/13 1307 05/21/13 1654 05/21/13 2145 05/22/13 0759 05/22/13 1209  GLUCAP 149* 151* 153* 179* 142*    Recent Results (from the past 240 hour(s))  MRSA PCR SCREENING     Status: None   Collection Time    05/20/13  7:51 PM      Result Value Range Status   MRSA by PCR NEGATIVE  NEGATIVE Final   Comment:            The GeneXpert MRSA Assay (FDA     approved for NASAL specimens     only), is one component of a     comprehensive MRSA colonization     surveillance program. It is not     intended to diagnose MRSA     infection nor to guide or     monitor treatment for     MRSA infections.  URINE CULTURE     Status: None   Collection Time    05/21/13 10:06 AM      Result Value Range Status   Specimen  Description URINE, RANDOM   Final   Special Requests NONE   Final   Culture  Setup Time     Final   Value: 05/21/2013 17:44     Performed at [facility] Count     Final   Value: >=100,000 COLONIES/ML     Performed at [facility]   Culture     Final   Value: GRAM NEGATIVE RODS     Performed at [facility]   Report Status PENDING   Incomplete     Studies:  Recent x-ray studies have been reviewed in detail by the Attending Physician  Junious Silk, ANP [facility] Office  [phone] Pager [phone]  **If unable to reach the above provider after paging please contact  the Flow Manager @ [phone]  On-Call/Text Page:      [email or web address]      password [email or web address]  If 7PM-7AM, please contact night-coverage [email or web address] Password [facility] 05/22/2013, 2:34 PM   LOS: 2 days   I have personally examined this patient and reviewed the entire database. I have reviewed the above note, made any necessary editorial changes, and agree with its content.  Lonia Blood, MD [facility]

## 2013-05-22 NOTE — Progress Notes (Signed)
Patient: Becky Collins Date of Encounter: 05/22/2013, 6:59 AM Admit date: 05/20/2013     Subjective  Becky Collins denies any complaints this AM. She reports intermittent dizziness and recurrent falls x 6 months.   Objective  Physical Exam: Vitals: BP 144/32  Pulse 48  Temp(Src) 98.2 F (36.8 C) (Oral)  Resp 17  Ht 5\' 3"  (1.6 m)  Wt 198 lb 6.6 oz (90 kg)  BMI 35.16 kg/m2  SpO2 99% General: Well developed, elderly 79 year old female in no acute distress. Neck: Supple. JVD not elevated. Lungs: Clear bilaterally to auscultation without wheezes, rales, or rhonchi. Breathing is unlabored. Heart: Regular S1 S2 without murmur, rub or gallop.  Abdomen: Soft, non-distended. Extremities: No clubbing or cyanosis. No edema.  Distal pedal pulses are 2+ and equal bilaterally. Neuro: Alert and oriented X 3. Moves all extremities spontaneously. No focal deficits.  Intake/Output:  Intake/Output Summary (Last 24 hours) at 05/22/13 0659 Last data filed at 05/22/13 0012  Gross per 24 hour  Intake   2838 ml  Output   2925 ml  Net    -87 ml    Inpatient Medications:  . amLODipine  5 mg Oral Daily  . aspirin EC  81 mg Oral QHS  . heparin  5,000 Units Subcutaneous Q8H  . insulin aspart  0-9 Units Subcutaneous TID WC  . pantoprazole  40 mg Oral Daily  . sertraline  100 mg Oral Daily  . sodium chloride  3 mL Intravenous Q12H    Labs:  Recent Labs  05/21/13 1705 05/22/13 0410  NA 135 138  K 4.9 5.5*  CL 104 108  CO2 21 23  GLUCOSE 152* 151*  BUN 57* 47*  CREATININE 3.13* 2.60*  CALCIUM 9.0 8.9    Recent Labs  05/20/13 2347  AST 27  ALT 17  ALKPHOS 54  BILITOT 0.2*  PROT 6.7  ALBUMIN 3.4*    Recent Labs  05/20/13 2347 05/21/13 0412 05/22/13 0410  WBC 5.7 5.9 PENDING  NEUTROABS 3.4  --   --   HGB 11.0* 10.5* 10.2*  HCT 33.4* 32.5* 32.3*  MCV 81.3 81.7 83.5  PLT 231 242 207    Recent Labs  05/20/13 2347  HGBA1C 10.0*    Recent Labs  05/20/13 2347  TSH  2.642    Radiology/Studies: US Renal  05/21/2013   *RADIOLOGY REPORT*  Retroperitoneal ultrasound  History:  Acute renal failure  Comparison:  June 21, 2012  Findings: Right kidney measures 10.9 cm in length.  Left kidney measures 11.0 cm in length.  There is no renal mass, pelvicaliectasis, or perinephric fluid collection on either side. There is no demonstrable calculus or ureterectasis on either side. Renal cortical thickness is low normal but stable bilaterally. Renal echogenicity is normal bilaterally.  There is a prominent column of Bertin on each side, an anatomic variant.  No lesion is seen by ultrasound in the region of the urinary bladder.  Conclusion:  Study within normal limits and stable.   Original Report Authenticated By: Bretta Bang, M.D.   Dg Chest Port 1 View  05/20/2013   *RADIOLOGY REPORT*  Clinical Data: Chest discomfort, shortness of breath, history of hypertension  PORTABLE CHEST - 1 VIEW  Comparison: 05/20/2013; 05/08/2013  Findings:  Grossly unchanged cardiac silhouette and mediastinal contours with atherosclerotic calcifications within the thoracic aorta.  The lungs appear hyperexpanded with flattening of the bilateral hemidiaphragms.  Minimal bibasilar opacities, left greater than right.  No focal  airspace opacity.  No definite pleural effusion or pneumothorax.  No definite evidence of edema.  Unchanged bones.  IMPRESSION: Minimal bibasilar atelectasis without definite acute cardiopulmonary disease on this low lung volumes AP portable examination.  Further evaluation with a PA and lateral chest radiograph may be obtained as clinically indicated.   Original Report Authenticated By: Tacey Ruiz, MD    Echocardiogram: 05/21/2013 Study Conclusions - Left ventricle: The cavity size was normal. Wall thickness was normal. Systolic function was normal. The estimated ejection fraction was in the range of 55% to 60%. Wall motion was normal; there were no regional wall  motion abnormalities. Left ventricular diastolic function parameters were normal. - Mitral valve: Moderate regurgitation. Impression: - Patient is in NSR with 2:1 block throughout the study.   Telemetry: persistent 2:1 AV block    Assessment and Plan  1. 2:1 AV block  - persistent; not currently symptomatic while lying in bed but probably related to dizziness and falls - h/o in 2012 but was taking CCB at the time; not on any rate slowing / AV nodal blocking medications since that time - may need PPM if persistent after correction of K 2. Acute on chronic renal failure - serum Cr 4.3 on admission, now 2.6 3. Hyperkalemia  - 6.2 on presentation to [facility], now 5.5 - was on ACEI and increased dose of Lasix PTA which have been stopped 4. Normal LV function  Dr. Graciela Husbands to see Signed, Rick Duff PA-C

## 2013-05-23 LAB — GLUCOSE, CAPILLARY
Glucose-Capillary: 206 mg/dL — ABNORMAL HIGH (ref 70–99)
Glucose-Capillary: 265 mg/dL — ABNORMAL HIGH (ref 70–99)
Glucose-Capillary: 236 mg/dL — ABNORMAL HIGH (ref 70–99)

## 2013-05-23 LAB — BASIC METABOLIC PANEL
BUN: 32 mg/dL — ABNORMAL HIGH (ref 6–23)
CO2: 18 mEq/L — ABNORMAL LOW (ref 19–32)
Chloride: 104 mEq/L (ref 96–112)
GFR calc non Af Amer: 22 mL/min — ABNORMAL LOW (ref >90)
Glucose, Bld: 227 mg/dL — ABNORMAL HIGH (ref 70–99)
Potassium: 5.2 mEq/L — ABNORMAL HIGH (ref 3.5–5.1)
Sodium: 135 mEq/L (ref 135–145)

## 2013-05-23 LAB — URINE CULTURE: Colony Count: >=100000

## 2013-05-23 MED ORDER — INSULIN GLARGINE 100 UNIT/ML ~~LOC~~ SOLN
10.0000 [IU] | Freq: Every day | SUBCUTANEOUS | Status: DC
  Administered 2013-05-23: 10 [IU] via SUBCUTANEOUS
  Filled 2013-05-23 (×2): qty 0.1

## 2013-05-23 MED ORDER — SODIUM CHLORIDE 0.9 % IV SOLN
INTRAVENOUS | Status: DC
  Administered 2013-05-23: 22:00:00 via INTRAVENOUS
  Administered 2013-05-23: 100 mL/h via INTRAVENOUS
  Administered 2013-05-24 (×3): via INTRAVENOUS
  Administered 2013-05-25: 50 mL/h via INTRAVENOUS
  Administered 2013-05-25: 04:00:00 via INTRAVENOUS
  Administered 2013-05-26: 1000 mL via INTRAVENOUS
  Administered 2013-05-27: 05:00:00 via INTRAVENOUS

## 2013-05-23 NOTE — Progress Notes (Addendum)
[facility] Progress Note [name]   Becky Collins:[id number] DOB: Nov 20, 1931 DOA: 05/20/2013 PCP: Becky Flavin, MD  Brief narrative: 79 year old female patient with baseline creatinine 1.7, also known hypertension, ?? seizure disorder, Mobitz type 1 AVB and diabetes. Presented to [facility] emergency department with complaints of weakness. No other associated symptoms.  Recently evaluated on 05/08/2013 at primary care physician's office for increased peripheral edema, Lasix dosage increased. In the ER patient was found to have acute renal failure with hyperkalemia with a creatinine of 4.9 and potassium 6.2. Patient was given Kayexalate, calcium gluconate, sodium bicarbonate, and dextrose and IV insulin. Patient was also hypotensive so was given 1 L of fluid. Because of the Mobitz type 1 heart block Cardiology evaluated the patient and recommended correction of potassium and holding AV nodal blocking agents.  Assessment/Plan:  Acute renal failure with hyperkalemia  -Scr has decreased to 2.6 but K back up to 5.5 - repeat BMET at 5p - if up further will need to treat -renal US normal without medical renal dz or hydronephrosis -OP PCP notes show pt on chronic Bactrim - now on hold 2/2 ARF -suspect combo of ACE I with Bactrim and new increased dose of Lasix cause of ARF -IVF's stopped 8/12 at 2000 due to order being expired- will resume today  Orthostatic hypotension - due to use of Lasix as outpt? -  OVS positive - resume hydration- fluids expired on evening of 12th -follow orthostatics every 12 hrs   Mobitz (type) I (Wenckebach's) atrioventricular block -Cards managing -has been symptomatic with dizziness and near syncope at home -plan is to ensure renal function and electrolytes have normalized before considering PPM -pt reportedly has NOT been on Depakote for several months so doubt this contributing to bradycardia  HTN (hypertension) -BP  moderately controlled -pre admit ACE I on hold -added IV hydralazine prn and Norvasc - may need to increase Norvasc dose  Moderate MR -found on ECHO this admit -follow  DM  -had issues with hypoglycemia likely due to decreased renal clearance so Lantus held -resume Lantus but at very low HS dose and cont SSI  - pt takes a strange regimen of Lantus 20 in AM and 60 in PM - PCP office was not much help in giving accurate doses of her meds!!  HLD (hyperlipidemia)  Citrobacter freundii UTI - > 100,000 colonies and resistant to BACTRIM which she was taking for prophylaxis against UTIs -still with significant dysuria  Seizure disorder -old notes from this facility show pt previously on Depakote - pharmacist confirms with family remote h/o seizures and several months ago was taken off Depakote without recurrence of seizures  DVT prophylaxis: SQ heparin Code Status: FULL Family Communication: Patient and family Disposition Plan/Expected LOS: SDU  Consultants: Cardiology  Procedures: 2-D echocardiogram - Left ventricle: The cavity size was normal. Wall thickness was normal. Systolic function was normal. The estimated ejection fraction was in the range of 55% to 60%. Wall motion was normal; there were no regional wall motion abnormalities. Left ventricular diastolic function parameters were normal. - Mitral valve: Moderate regurgitation. Impressions: - Patient is in NSR with 2:1 block throughout the study.  Antibiotics: Rocephin 8/13 >>  HPI/Subjective: Patient alert and conversant.  Denies n/v, sob, f/c, or cp.  Does c/o dysuria.  Objective: Blood pressure 179/46, pulse 94, temperature 97.7 F (36.5 C), temperature source Oral, resp. rate 18, height 5\' 3"  (1.6 m), weight 89.9 kg (198 lb 3.1  oz), SpO2 98.00%.  Intake/Output Summary (Last 24 hours) at 05/23/13 1253 Last data filed at 05/23/13 1103  Gross per 24 hour  Intake    103 ml  Output   2800 ml  Net  -2697 ml    Exam: General: No acute respiratory distress Lungs: Clear to auscultation bilaterally without wheezes or crackles, 2L Cardiovascular: Regular rate and rhythm without murmur gallop or rub normal S1 and S2, 1+ peripheral edema without JVD Abdomen: Mildly tender suprapubic area, nondistended, soft, bowel sounds positive, no rebound, no ascites, no appreciable mass Musculoskeletal: No significant cyanosis, clubbing of bilateral lower extremities Neurological: Alert and oriented x 3, CN 2-12 intact  Scheduled Meds:  Scheduled Meds: . amLODipine  5 mg Oral Daily  . aspirin EC  81 mg Oral QHS  . cefTRIAXone (ROCEPHIN)  IV  1 g Intravenous Q24H  . insulin aspart  0-9 Units Subcutaneous TID WC  . insulin glargine  10 Units Subcutaneous QHS  . pantoprazole  40 mg Oral Daily  . sertraline  100 mg Oral Daily  . sodium chloride  3 mL Intravenous Q12H   Data Reviewed: Basic Metabolic Panel:  Recent Labs Lab 05/21/13 0412 05/21/13 1705 05/22/13 0410 05/22/13 1705 05/23/13 0330  NA 138 135 138 135 135  K 4.6 4.9 5.5* 5.4* 5.2*  CL 103 104 108 107 104  CO2 23 21 23 19  18*  GLUCOSE 57* 152* 151* 222* 227*  BUN 67* 57* 47* 36* 32*  CREATININE 4.27* 3.13* 2.60* 2.19* 2.09*  CALCIUM 9.7 9.0 8.9 9.3 9.2   Liver Function Tests:  Recent Labs Lab 05/20/13 2347  AST 27  ALT 17  ALKPHOS 54  BILITOT 0.2*  PROT 6.7  ALBUMIN 3.4*    Recent Labs Lab 05/20/13 2347  AMMONIA 32   CBC:  Recent Labs Lab 05/20/13 2347 05/21/13 0412 05/22/13 0410  WBC 5.7 5.9 4.9  NEUTROABS 3.4  --   --   HGB 11.0* 10.5* 10.2*  HCT 33.4* 32.5* 32.3*  MCV 81.3 81.7 83.5  PLT 231 242 207   CBG:  Recent Labs Lab 05/22/13 1209 05/22/13 1718 05/22/13 2147 05/23/13 0804 05/23/13 1206  GLUCAP 142* 178* 215* 206* 265*    Recent Results (from the past 240 hour(s))  MRSA PCR SCREENING     Status: None   Collection Time    05/20/13  7:51 PM      Result Value Range Status   MRSA by PCR  NEGATIVE  NEGATIVE Final   Comment:            The GeneXpert MRSA Assay (FDA     approved for NASAL specimens     only), is one component of a     comprehensive MRSA colonization     surveillance program. It is not     intended to diagnose MRSA     infection nor to guide or     monitor treatment for     MRSA infections.  URINE CULTURE     Status: None   Collection Time    05/21/13 10:06 AM      Result Value Range Status   Specimen Description URINE, RANDOM   Final   Special Requests NONE   Final   Culture  Setup Time     Final   Value: 05/21/2013 17:44     Performed at [facility] Count     Final   Value: >=100,000 COLONIES/ML  Performed at [facility]     Final   Value: CITROBACTER FREUNDII     Performed at [facility]   Report Status 05/23/2013 FINAL   Final   Organism ID, Bacteria CITROBACTER FREUNDII   Final     Studies:  Recent x-ray studies have been reviewed in detail by the Attending Physician  Junious Silk, ANP [facility] Office  [phone] Pager [phone]  **If unable to reach the above provider after paging please contact the Flow Manager @ [phone]  On-Call/Text Page:      [email or web address]      password [email or web address]  If 7PM-7AM, please contact night-coverage [email or web address] Password [facility] 05/23/2013, 12:53 PM   LOS: 3 days    I have examined the patient, reviewed the chart and modified the above note which I agree with.   Twan Harkin,MD [phone] 05/23/2013, 4:44 PM

## 2013-05-23 NOTE — Progress Notes (Signed)
Patient Name: Becky Collins      SUBJECTIVE: denies shortness of breath or chest pain  Past Medical History  Diagnosis Date  . Hypertension     moderate left ventricular hypertrophy  . Diabetes mellitus     h/o negative exercise stress test/normal LVF assessed June 2011 by echo EF 60-65%  . Hyperlipidemia   . Carotid artery disease     with less than 60% stenosis on the right in 2006  . History of seizure disorder     on depakote  . History of recurrent UTIs   . Chronic edema   . Depression   . Acid reflux disease   . History of medication noncompliance     this has been ongoing for quite sometime  . 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 Dysrhythmia     Second-degree AVB Mobitz 1, 11/12; history of PAT  . Chronic kidney disease   . Stroke     Scheduled Meds:  Scheduled Meds: . amLODipine  5 mg Oral Daily  . aspirin EC  81 mg Oral QHS  . cefTRIAXone (ROCEPHIN)  IV  1 g Intravenous Q24H  . heparin  5,000 Units Subcutaneous Q8H  . insulin aspart  0-9 Units Subcutaneous TID WC  . pantoprazole  40 mg Oral Daily  . sertraline  100 mg Oral Daily  . sodium chloride  3 mL Intravenous Q12H   Continuous Infusions:   PHYSICAL EXAM Filed Vitals:   05/22/13 2000 05/23/13 0000 05/23/13 0400 05/23/13 0436  BP: 126/35 133/42 150/42   Pulse: 68 39 45   Temp: 97.7 F (36.5 C) 98 F (36.7 C) 98 F (36.7 C)   TempSrc: Oral Oral Oral   Resp: 20 15 15    Height:      Weight:    198 lb 3.1 oz (89.9 kg)  SpO2: 97% 96% 97%      TELEMETRY: Reviewed telemetry pt in  NSR with 2:1 and MBZ1 heart block:    Intake/Output Summary (Last 24 hours) at 05/23/13 0802 Last data filed at 05/23/13 0400  Gross per 24 hour  Intake      3 ml  Output   2750 ml  Net  -2747 ml    LABS: Basic Metabolic Panel:  Recent Labs Lab 05/20/13 2347 05/21/13 0412 05/21/13 1705 05/22/13 0410 05/22/13 1705 05/23/13 0330  NA 136 138 135 138 135 135  K 5.3* 4.6 4.9 5.5*  5.4* 5.2*  CL 103 103 104 108 107 104  CO2 20 23 21 23 19  18*  GLUCOSE 69* 57* 152* 151* 222* 227*  BUN 68* 67* 57* 47* 36* 32*  CREATININE 4.30* 4.27* 3.13* 2.60* 2.19* 2.09*  CALCIUM 10.1 9.7 9.0 8.9 9.3 9.2   Cardiac Enzymes: No results found for this basename: CKTOTAL, CKMB, CKMBINDEX, TROPONINI,  in the last 72 hours CBC:  Recent Labs Lab 05/20/13 2347 05/21/13 0412 05/22/13 0410  WBC 5.7 5.9 4.9  NEUTROABS 3.4  --   --   HGB 11.0* 10.5* 10.2*  HCT 33.4* 32.5* 32.3*  MCV 81.3 81.7 83.5  PLT 231 242 207   PROTIME: No results found for this basename: LABPROT, INR,  in the last 72 hours Liver Function Tests:  Recent Labs  05/20/13 2347  AST 27  ALT 17  ALKPHOS 54  BILITOT 0.2*  PROT 6.7  ALBUMIN 3.4*   No results found for this basename: LIPASE, AMYLASE,  in the  last 72 hours BNP: BNP (last 3 results) No results found for this basename: PROBNP,  in the last 8760 hours D-Dimer: No results found for this basename: DDIMER,  in the last 72 hours Hemoglobin A1C:  Recent Labs  05/20/13 2347  HGBA1C 10.0*   Fasting Lipid Panel: No results found for this basename: CHOL, HDL, LDLCALC, TRIG, CHOLHDL, LDLDIRECT,  in the last 72 hours Thyroid Function Tests:  Recent Labs  05/20/13 2347  TSH 2.642     ASSESSMENT AND PLAN:  Principal Problem:   Mobitz (type) I (Wenckebach's) atrioventricular block Active Problems:   HTN (hypertension)   HLD (hyperlipidemia)   DM (diabetes mellitus)   Hyperkalemia   Acute renal failure   Abnormal urinalysis   Seizure disorder   Hypoglycemia  Pt s rhythm does not mandate pacing in the absence of attributable symptoms  We willtry and ambulate today and see how if she does.  We will make a decision re pacing in am or later  Renal function and hyperkalemia improving. The latter is not likely culprit  OT  PT consult  Signed, Sherryl Manges MD  05/23/2013

## 2013-05-24 ENCOUNTER — Encounter (HOSPITAL_COMMUNITY)
Admission: AD | Disposition: A | Discharge status: Skilled Nursing Facility | Payer: Self-pay | Source: Other Acute Inpatient Hospital | Attending: Internal Medicine

## 2013-05-24 DIAGNOSIS — I499 Cardiac arrhythmia, unspecified: Secondary | ICD-10-CM

## 2013-05-24 LAB — GLUCOSE, CAPILLARY

## 2013-05-24 LAB — CBC
HCT: 35.4 % — ABNORMAL LOW (ref 36.0–46.0)
Platelets: 218 10*3/uL (ref 150–400)
RBC: 4.28 MIL/uL (ref 3.87–5.11)
RDW: 15.2 % (ref 11.5–15.5)
WBC: 6.9 10*3/uL (ref 4.0–10.5)

## 2013-05-24 LAB — BASIC METABOLIC PANEL
CO2: 18 mEq/L — ABNORMAL LOW (ref 19–32)
Chloride: 106 mEq/L (ref 96–112)
Creatinine, Ser: 1.65 mg/dL — ABNORMAL HIGH (ref 0.50–1.10)
GFR calc Af Amer: 33 mL/min — ABNORMAL LOW (ref >90)
Sodium: 135 mEq/L (ref 135–145)

## 2013-05-24 SURGERY — PERMANENT PACEMAKER INSERTION
Anesthesia: LOCAL

## 2013-05-24 MED ORDER — GABAPENTIN 300 MG PO CAPS
900.0000 mg | ORAL_CAPSULE | Freq: Two times a day (BID) | ORAL | Status: DC
  Administered 2013-05-24 – 2013-05-25 (×3): 900 mg via ORAL
  Filled 2013-05-24 (×4): qty 3

## 2013-05-24 MED ORDER — INSULIN GLARGINE 100 UNIT/ML ~~LOC~~ SOLN
20.0000 [IU] | Freq: Every day | SUBCUTANEOUS | Status: DC
  Administered 2013-05-24 – 2013-05-26 (×3): 20 [IU] via SUBCUTANEOUS
  Filled 2013-05-24 (×4): qty 0.2

## 2013-05-24 MED ORDER — AMLODIPINE BESYLATE 10 MG PO TABS
10.0000 mg | ORAL_TABLET | Freq: Every day | ORAL | Status: DC
  Administered 2013-05-25: 10 mg via ORAL
  Filled 2013-05-24: qty 1

## 2013-05-24 MED ORDER — MUSCLE RUB 10-15 % EX CREA
TOPICAL_CREAM | CUTANEOUS | Status: DC | PRN
  Filled 2013-05-24: qty 85

## 2013-05-24 MED ORDER — BRIMONIDINE TARTRATE 0.2 % OP SOLN
1.0000 [drp] | Freq: Two times a day (BID) | OPHTHALMIC | Status: DC
  Administered 2013-05-24 – 2013-05-27 (×7): 1 [drp] via OPHTHALMIC
  Filled 2013-05-24: qty 5

## 2013-05-24 MED ORDER — LISINOPRIL 20 MG PO TABS: 20.0000 mg | ORAL_TABLET | Freq: Every morning | ORAL | Status: DC

## 2013-05-24 NOTE — Evaluation (Signed)
Physical Therapy Evaluation Patient Details Name: Becky Collins MRN: [id number] DOB: 1932-08-09 Today's Date: 05/24/2013 Time: 1419-1440 PT Time Calculation (min): 21 min  PT Assessment / Plan / Recommendation History of Present Illness  79 year old female patient with baseline creatinine 1.7, also known hypertension, ?? seizure disorder, Mobitz type 1 AVB and diabetes. Presented to [facility] emergency department with complaints of weakness. No other associated symptoms.  Pt found to be have   Mobitz (type) I (Wenckebach's) atrioventricular block.  Per MD note mobility is key.   Clinical Impression  Very limited due to overall fatigue and dizziness during standing.  Pt unable to tolerate standing for long period due to dizziness and orthostatics.  Pt will benefit from acute PT services to improve overall mobility and prepare for safe d/c to next venue.     PT Assessment  Patient needs continued PT services    Follow Up Recommendations  Home health PT;Supervision/Assistance - 24 hour (depending on progress made may need SNF)    Equipment Recommendations  None recommended by PT    Frequency Min 3X/week    Precautions / Restrictions Precautions Precautions: Fall Restrictions Weight Bearing Restrictions: No   Pertinent Vitals/Pain No c/o pain  Sitting BP- 173/78 Standing BP- 136/79      Mobility  Bed Mobility Bed Mobility: Not assessed (Pt sitting on 3n1 with RN when entering) Transfers Transfers: Sit to Stand;Stand to Sit Sit to Stand: 3: Mod assist;From chair/3-in-1 Stand to Sit: 3: Mod assist;To chair/3-in-1 Details for Transfer Assistance: Sit <> stand x 2 with (A) to initiate transfer and slowly descend with max cues for hand placement.  limited due to overall dizziness and fatigue. Ambulation/Gait Ambulation/Gait Assistance: Not tested (comment)    Exercises     PT Diagnosis: Difficulty walking;Generalized weakness  PT Problem List: Decreased  strength;Decreased activity tolerance;Decreased balance;Decreased mobility;Decreased knowledge of use of DME;Cardiopulmonary status limiting activity PT Treatment Interventions: DME instruction;Gait training;Functional mobility training;Therapeutic activities;Therapeutic exercise;Balance training;Patient/family education     PT Goals(Current goals can be found in the care plan section) Acute Rehab PT Goals Patient Stated Goal: To get stronger and go home. PT Goal Formulation: With patient Time For Goal Achievement: 05/31/13 Potential to Achieve Goals: Good  Visit Information  Last PT Received On: 05/24/13 Assistance Needed: +2 (safety) History of Present Illness: 79 year old female patient with baseline creatinine 1.7, also known hypertension, ?? seizure disorder, Mobitz type 1 AVB and diabetes. Presented to [facility] emergency department with complaints of weakness. No other associated symptoms.  Pt found to be have   Mobitz (type) I (Wenckebach's) atrioventricular block.  Per MD note mobility is key.        Prior Functioning  Home Living Family/patient expects to be discharged to:: Private residence Living Arrangements: Children (dtr) Available Help at Discharge: Family;Available 24 hours/day Type of Home: House Home Access: Level entry Home Layout: One level Home Equipment: Bedside commode;Walker - 2 wheels;Cane - single point;Wheelchair - manual Prior Function Level of Independence: Independent with assistive device(s) (uses cane for household and RW community distance) Communication Communication: No difficulties Dominant Hand: Right    Cognition  Cognition Arousal/Alertness: Awake/alert Behavior During Therapy: WFL for tasks assessed/performed Overall Cognitive Status: Within Functional Limits for tasks assessed    Extremity/Trunk Assessment Lower Extremity Assessment Lower Extremity Assessment: Generalized weakness   Balance Balance Balance Assessed:  Yes Static Standing Balance Static Standing - Balance Support: Bilateral upper extremity supported Static Standing - Level of Assistance: 4: Min assist Static Standing -  Comment/# of Minutes: (A) to maintain balance due to overall fatigue and dizziness while attempting to get orthostaic BP.  Pt unable to maintain upright standing to complete orthostatics  End of Session PT - End of Session Equipment Utilized During Treatment: Gait belt Activity Tolerance: Patient limited by fatigue;Other (comment) (limited by dizziness) Patient left: in chair;with call bell/phone within reach Nurse Communication: Mobility status  GP     Becky Collins 05/24/2013, 4:02 PM  Jake Shark, PT DPT [phone]

## 2013-05-24 NOTE — Progress Notes (Addendum)
[facility] Progress Note Becky Collins TEAM 1 - Stepdown ICU Team   JOSLIN DOELL ZOX:[id number] DOB: 07-15-1932 DOA: 05/20/2013 PCP: Selinda Flavin, MD  Brief narrative: 79 year old female patient with baseline creatinine 1.7, also known hypertension, ?? seizure disorder, Mobitz type 1 AVB and diabetes. Presented to [facility] emergency department with complaints of weakness. No other associated symptoms.  Recently evaluated on 05/08/2013 at primary care physician's office for increased peripheral edema, Lasix dosage increased. In the ER patient was found to have acute renal failure with hyperkalemia with a creatinine of 4.9 and potassium 6.2. Patient was given Kayexalate, calcium gluconate, sodium bicarbonate, and dextrose and IV insulin. Patient was also hypotensive so was given 1 L of fluid. Because of the Mobitz type 1 heart block Cardiology evaluated the patient and recommended correction of potassium and holding AV nodal blocking agents.  Assessment/Plan:  Acute renal failure with hyperkalemia  -Scr steady trend down- BUN normal -renal US normal without medical renal dz or hydronephrosis -OP PCP notes show pt on chronic Bactrim - now on hold 2/2 ARF -suspect combo of ACE I with Bactrim and new increased dose of Lasix cause of ARF  Orthostatic hypotension - due to use of Lasix as outpt? -  OVS positive -follow orthostatics every 12 hrs- repeat now as non done today - will decide of whether to cont fluid based on orthostatics   Mobitz (type) I (Wenckebach's) atrioventricular block -Cards managing-has not resolved but note increase in HR -has been symptomatic with dizziness and near syncope at home -no indication for PPM at this time -pt reportedly has NOT been on Depakote for several months so doubt this contributing to bradycardia  HTN (hypertension) -BP moderately controlled -pre admit ACE I on hold - - ACE I aon hold due to ARF and CRF  - BP poorly controlled so  will  increase Norvasc dose from 5 to 10 for tomorrow (8/15)  Moderate MR -found on ECHO this admit -follow  DM  -had issues with hypoglycemia likely due to decreased renal clearance so Lantus held -resume Lantus but at very low HS dose and cont SSI  - Will increase lantus to 20 U today - pt takes a strange regimen of Lantus 20 in AM and 60 in PM-likely will need to re think dc dose - PCP office was not much help in giving accurate doses of her meds!!  HLD (hyperlipidemia)  Citrobacter freundii UTI - > 100,000 colonies and resistant to BACTRIM which she was taking for prophylaxis against UTIs - will give 5-7 day course only.   Seizure disorder -old notes from this facility show pt previously on Depakote - pharmacist confirms with family remote h/o seizures and several months ago was taken off Depakote without recurrence of seizures  DVT prophylaxis: SQ heparin Code Status: FULL Family Communication: Patient and son Disposition Plan/Expected LOS: Transfer to telemetry  Consultants: Cardiology  Procedures: 2-D echocardiogram - Left ventricle: The cavity size was normal. Wall thickness was normal. Systolic function was normal. The estimated ejection fraction was in the range of 55% to 60%. Wall motion was normal; there were no regional wall motion abnormalities. Left ventricular diastolic function parameters were normal. - Mitral valve: Moderate regurgitation. Impressions: - Patient is in NSR with 2:1 block throughout the study.  Antibiotics: Rocephin 8/13 >>  HPI/Subjective: Patient alert and conversant.  Denies n/v, sob, f/c, or cp.  Has chronic right lower and abd pain. No dysuria. C/o right knee pain today which is also  chronic- uses alcohol on it at home.   Objective: Blood pressure 149/53, pulse 90, temperature 97.6 F (36.4 C), temperature source Oral, resp. rate 21, height 5\' 3"  (1.6 m), weight 90 kg (198 lb 6.6 oz), SpO2 95.00%.  Intake/Output Summary (Last  24 hours) at 05/24/13 1329 Last data filed at 05/24/13 1156  Gross per 24 hour  Intake   2200 ml  Output   2500 ml  Net   -300 ml   Exam: General: No acute respiratory distress Lungs: Clear to auscultation bilaterally without wheezes or crackles, RA Cardiovascular: Irregular rate and with 2nd degree AVB with 2:1 conduction- without murmur gallop or rub normal S1 and S2, 1+ peripheral edema without JVD Abdomen: Mildly tender suprapubicamd RLQ  area, nondistended, soft, bowel sounds positive, no rebound, no ascites, no appreciable mass Musculoskeletal: No significant cyanosis, clubbing of bilateral lower extremities - right kneed not swollen or tender.  Neurological: Alert and oriented x 3, CN 2-12 intact  Scheduled Meds:  Scheduled Meds: . [START ON 05/25/2013] amLODipine  10 mg Oral Daily  . aspirin EC  81 mg Oral QHS  . cefTRIAXone (ROCEPHIN)  IV  1 g Intravenous Q24H  . insulin aspart  0-9 Units Subcutaneous TID WC  . insulin glargine  20 Units Subcutaneous QHS  . pantoprazole  40 mg Oral Daily  . sertraline  100 mg Oral Daily  . sodium chloride  3 mL Intravenous Q12H   Data Reviewed: Basic Metabolic Panel:  Recent Labs Lab 05/21/13 1705 05/22/13 0410 05/22/13 1705 05/23/13 0330 05/24/13 0546  NA 135 138 135 135 135  K 4.9 5.5* 5.4* 5.2* 5.2*  CL 104 108 107 104 106  CO2 21 23 19  18* 18*  GLUCOSE 152* 151* 222* 227* 243*  BUN 57* 47* 36* 32* 19  CREATININE 3.13* 2.60* 2.19* 2.09* 1.65*  CALCIUM 9.0 8.9 9.3 9.2 9.4   Liver Function Tests:  Recent Labs Lab 05/20/13 2347  AST 27  ALT 17  ALKPHOS 54  BILITOT 0.2*  PROT 6.7  ALBUMIN 3.4*    Recent Labs Lab 05/20/13 2347  AMMONIA 32   CBC:  Recent Labs Lab 05/20/13 2347 05/21/13 0412 05/22/13 0410 05/24/13 0546  WBC 5.7 5.9 4.9 6.9  NEUTROABS 3.4  --   --   --   HGB 11.0* 10.5* 10.2* 11.2*  HCT 33.4* 32.5* 32.3* 35.4*  MCV 81.3 81.7 83.5 82.7  PLT 231 242 207 218   CBG:  Recent Labs Lab  05/23/13 1206 05/23/13 1647 05/23/13 2144 05/24/13 0820 05/24/13 1149  GLUCAP 265* 188* 236* 206* 207*    Recent Results (from the past 240 hour(s))  MRSA PCR SCREENING     Status: None   Collection Time    05/20/13  7:51 PM      Result Value Range Status   MRSA by PCR NEGATIVE  NEGATIVE Final   Comment:            The GeneXpert MRSA Assay (FDA     approved for NASAL specimens     only), is one component of a     comprehensive MRSA colonization     surveillance program. It is not     intended to diagnose MRSA     infection nor to guide or     monitor treatment for     MRSA infections.  URINE CULTURE     Status: None   Collection Time    05/21/13 10:06 AM  Result Value Range Status   Specimen Description URINE, RANDOM   Final   Special Requests NONE   Final   Culture  Setup Time     Final   Value: 05/21/2013 17:44     Performed at [facility] Count     Final   Value: >=100,000 COLONIES/ML     Performed at [facility]   Culture     Final   Value: CITROBACTER FREUNDII     Performed at [facility]   Report Status 05/23/2013 FINAL   Final   Organism ID, Bacteria CITROBACTER FREUNDII   Final     Studies:  Recent x-ray studies have been reviewed in detail by the Attending Physician  Junious Silk, ANP [facility] Office  [phone] Pager [phone]  **If unable to reach the above provider after paging please contact the Flow Manager @ [phone]  On-Call/Text Page:      [email or web address]      password [email or web address]  If 7PM-7AM, please contact night-coverage [email or web address] Password [email or web address] 05/24/2013, 1:29 PM   LOS: 4 days   I have examined the patient, reviewed the chart and modified the above note which I agree with. Orthostatics positive- pt also getting dizzy upon standing- increase fluids back upto 125 cc (still negative 2 L since admission). Cont to check orthostatics every 12 hrs. -if no improvement after full hydration,  would check for adrenal insuffiency.   Cohen Boettner,MD [phone] 05/24/2013, 2:06 PM

## 2013-05-24 NOTE — Progress Notes (Addendum)
Report called to receiving rn. Pt with no complaints of pain at the current time.  IV team called for IV restart

## 2013-05-24 NOTE — Progress Notes (Signed)
Pt has intermittent confusion and tries to get out of bed. Attempted to remove foley and perform foley care but pt refused. Explained the reasons for taking out the foley and pt became agitated. Left foley in place and will continue to monitor.

## 2013-05-24 NOTE — Progress Notes (Signed)
Patient Name: Becky Collins      SUBJECTIVE: weak when trying to get up yesterday But not really ambulating yet      Past Medical History  Diagnosis Date  . Hypertension     moderate left ventricular hypertrophy  . Diabetes mellitus     h/o negative exercise stress test/normal LVF assessed June 2011 by echo EF 60-65%  . Hyperlipidemia   . Carotid artery disease     with less than 60% stenosis on the right in 2006  . History of seizure disorder     on depakote  . History of recurrent UTIs   . Chronic edema   . Depression   . Acid reflux disease   . History of medication noncompliance     this has been ongoing for quite sometime  . 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 Dysrhythmia     Second-degree AVB Mobitz 1, 11/12; history of PAT  . Chronic kidney disease   . Stroke     Scheduled Meds:  Scheduled Meds: . amLODipine  5 mg Oral Daily  . aspirin EC  81 mg Oral QHS  . cefTRIAXone (ROCEPHIN)  IV  1 g Intravenous Q24H  . insulin aspart  0-9 Units Subcutaneous TID WC  . insulin glargine  10 Units Subcutaneous QHS  . pantoprazole  40 mg Oral Daily  . sertraline  100 mg Oral Daily  . sodium chloride  3 mL Intravenous Q12H   Continuous Infusions: . sodium chloride 100 mL/hr at 05/23/13 2155    PHYSICAL EXAM Filed Vitals:   05/24/13 0000 05/24/13 0400 05/24/13 0415 05/24/13 0500  BP: 179/36  193/35   Pulse:      Temp: 98.8 F (37.1 C) 97.7 F (36.5 C)    TempSrc: Oral Oral    Resp: 21  17   Height:      Weight:    198 lb 6.6 oz (90 kg)  SpO2: 94%  95%    Well developed and nourished in no acute distress HENT normal Neck supple with JVP-flat Carotids brisk and full without bruits Clear Irregular rate and rhythm, no murmurs or gallops Abd-soft with active BS without hepatomegaly No Clubbing cyanosis edema Skin-warm and dry A & Oriented  Grossly normal sensory and motor function  TELEMETRY: Reviewed telemetry pt in  2AVB1    Intake/Output Summary (Last 24 hours) at 05/24/13 0811 Last data filed at 05/24/13 0700  Gross per 24 hour  Intake   2100 ml  Output   2600 ml  Net   -500 ml    LABS: Basic Metabolic Panel:  Recent Labs Lab 05/20/13 2347 05/21/13 0412 05/21/13 1705 05/22/13 0410 05/22/13 1705 05/23/13 0330 05/24/13 0546  NA 136 138 135 138 135 135 135  K 5.3* 4.6 4.9 5.5* 5.4* 5.2* 5.2*  CL 103 103 104 108 107 104 106  CO2 20 23 21 23 19  18* 18*  GLUCOSE 69* 57* 152* 151* 222* 227* 243*  BUN 68* 67* 57* 47* 36* 32* 19  CREATININE 4.30* 4.27* 3.13* 2.60* 2.19* 2.09* 1.65*  CALCIUM 10.1 9.7 9.0 8.9 9.3 9.2 9.4   Cardiac Enzymes: No results found for this basename: CKTOTAL, CKMB, CKMBINDEX, TROPONINI,  in the last 72 hours CBC:  Recent Labs Lab 05/20/13 2347 05/21/13 0412 05/22/13 0410 05/24/13 0546  WBC 5.7 5.9 4.9 6.9  NEUTROABS 3.4  --   --   --   HGB 11.0*  10.5* 10.2* 11.2*  HCT 33.4* 32.5* 32.3* 35.4*  MCV 81.3 81.7 83.5 82.7  PLT 231 242 207 218    ASSESSMENT AND PLAN:  Principal Problem:   Mobitz (type) I (Wenckebach's) atrioventricular block Active Problems:   HTN (hypertension)   HLD (hyperlipidemia)   DM (diabetes mellitus)   Hyperkalemia   Acute renal failure   Abnormal urinalysis   Seizure disorder   Hypoglycemia type IV RTA  i am not convinced the rhtyhm is the issue,  With HR 70s and BP 170s the hemodynamics of the rhythm are hardly impressive i suspect much of her weakness is now bed rest  MOBILITY will  Be key  Would transfer her to floor and get OT/PT involved,  Consulted yesterday but .Marland Kitchen...   Signed, Sherryl Manges MD  05/24/2013

## 2013-05-25 DIAGNOSIS — I10 Essential (primary) hypertension: Secondary | ICD-10-CM

## 2013-05-25 LAB — GLUCOSE, CAPILLARY: Glucose-Capillary: 187 mg/dL — ABNORMAL HIGH (ref 70–99)

## 2013-05-25 MED ORDER — HEPARIN SODIUM (PORCINE) 5000 UNIT/ML IJ SOLN
5000.0000 [IU] | Freq: Three times a day (TID) | INTRAMUSCULAR | Status: DC
  Administered 2013-05-25 – 2013-05-27 (×6): 5000 [IU] via SUBCUTANEOUS
  Filled 2013-05-25 (×9): qty 1

## 2013-05-25 MED ORDER — GABAPENTIN 400 MG PO CAPS
700.0000 mg | ORAL_CAPSULE | Freq: Two times a day (BID) | ORAL | Status: DC
  Administered 2013-05-25 – 2013-05-27 (×4): 700 mg via ORAL
  Filled 2013-05-25 (×5): qty 1

## 2013-05-25 MED ORDER — AMLODIPINE BESYLATE 5 MG PO TABS
5.0000 mg | ORAL_TABLET | Freq: Every day | ORAL | Status: DC
  Administered 2013-05-26 – 2013-05-27 (×2): 5 mg via ORAL
  Filled 2013-05-25 (×2): qty 1

## 2013-05-25 NOTE — Progress Notes (Signed)
Clinical Social Work Department BRIEF PSYCHOSOCIAL ASSESSMENT 05/25/2013  Patient:  Becky Collins, Becky Collins     Account Number:  [id number]     Admit date:  05/20/2013  Clinical Social Worker:  Doree Albee  Date/Time:  05/25/2013 11:04 AM  Referred by:  Physician  Date Referred:  05/24/2013 Referred for  SNF Placement   Other Referral:   Interview type:  Patient Other interview type:   and patient son    PSYCHOSOCIAL DATA Living Status:  FAMILY Admitted from facility:   Level of care:   Primary support name:  Frannie Shedrick Primary support relationship to patient:  CHILD, ADULT Degree of support available:   moderate, lives with patient however has limited physical abilities to assist pt due to stroke.    Pt son michael at bedside    CURRENT CONCERNS Current Concerns  Post-Acute Placement   Other Concerns:    SOCIAL WORK ASSESSMENT / PLAN CSW met wtih pt and pt son  at bedside to complete psychosocial assessment. Patient shared that she would like to go to skilled nursing short term rehab as she does not have 24 hour care that can physicaly assist her that she may need. Pt son also advocated for patient to go to skilled nursing. Pt and pt son shared that patient has previouslly been to [facility] but [facility] up. CSW will iniiate snf search. Pt and pt son stated those are the only two facilities they are interested in at this time.   Assessment/plan status:  Psychosocial Support/Ongoing Assessment of Needs Other assessment/ plan:   Information/referral to community resources:   skilled nursing facility list    PATIENT'S/FAMILY'S RESPONSE TO PLAN OF CARE: Pt and pt son thanked csw for concern and support. Pt is motivated to regain her strength in rehab when medically stable.   Catha Gosselin, LCSW [phone]  ED CSW .05/25/2013 11:08am

## 2013-05-25 NOTE — Progress Notes (Signed)
Patient ID: Becky Collins, female   DOB: [birth year], 79 y.o.   MRN: [id number] Chart and telemetry reviewed. Heart rate has picked up into the 80s. She continues to be in second degree AV block1. we'll follow with you. No change in recommendations.

## 2013-05-25 NOTE — Progress Notes (Signed)
PATIENT DETAILS Name: Becky Collins Age: 79 y.o. Sex: female Date of Birth: 1931/12/27 Admit Date: 05/20/2013 Admitting Physician Christiane Ha, MD ZOX:WRUEAV, Caryn Bee, MD  Subjective: Still with gen weakness-but anxious to go home  Assessment/Plan: Acute renal failure with hyperkalemia  -Scr steady trend down- BUN normal  -renal US normal without medical renal dz or hydronephrosis  -OP PCP notes show pt on chronic Bactrim - now on hold 2/2 ARF  -suspect combo of ACE I with Bactrim and new increased dose of Lasix cause of ARF   Orthostatic hypotension  - due to use of Lasix as outpt?  - OVS positive  -follow orthostatics and adjust medications accordingly-decrease amlodipine back to 5 mg, place thigh high ted hose  Mobitz (type) I (Wenckebach's) atrioventricular block  -Cards managing-has not resolved but note increase in HR  -has been symptomatic with dizziness and near syncope at home  -no indication for PPM at this time  -pt reportedly has NOT been on Depakote for several months so doubt this contributing to bradycardia   HTN (hypertension)  -BP moderately controlled  -pre admit ACE I on hold  - ACE I aon hold due to ARF and CRF  -BP fluctuating-on Amlodipine-continue-may need to adjust dosing depending on orthostatics-will decrease back to 5 mg  Moderate MR  -found on ECHO this admit  -follow   DM  -had issues with hypoglycemia likely due to decreased renal clearance so Lantus held  -slolwy resumed Lantus but at very low HS dose and cont SSI, now at 20 units. - pt takes a strange regimen of Lantus 20 in AM and 60 in PM-likely will need to re think dc dose   HLD (hyperlipidemia)  Citrobacter freundii UTI  - > 100,000 colonies and resistant to BACTRIM which she was taking for prophylaxis against UTIs  - will give 5-7 day course only.   Seizure disorder  -old notes from this facility show pt previously on Depakote - pharmacist confirms with family remote h/o  seizures and several months ago was taken off Depakote without recurrence of seizures   Hx of CVA -c/w ASA  Disposition: Remain inpatient  DVT Prophylaxis: Prophylactic Lovenox or Heparin or SCD's  Code Status: Full code   Family Communication Son at bedside  Procedures:  None  CONSULTS:  cardiology   MEDICATIONS: Scheduled Meds: . amLODipine  10 mg Oral Daily  . aspirin EC  81 mg Oral QHS  . brimonidine  1 drop Both Eyes BID  . cefTRIAXone (ROCEPHIN)  IV  1 g Intravenous Q24H  . gabapentin  900 mg Oral BID  . insulin aspart  0-9 Units Subcutaneous TID WC  . insulin glargine  20 Units Subcutaneous QHS  . pantoprazole  40 mg Oral Daily  . sertraline  100 mg Oral Daily  . sodium chloride  3 mL Intravenous Q12H   Continuous Infusions: . sodium chloride 125 mL/hr at 05/25/13 0500   PRN Meds:.acetaminophen, acetaminophen, LORazepam, morphine injection, MUSCLE RUB, ondansetron (ZOFRAN) IV, ondansetron  Antibiotics: Anti-infectives   Start     Dose/Rate Route Frequency Ordered Stop   05/22/13 1000  cefTRIAXone (ROCEPHIN) 1 g in dextrose 5 % 50 mL IVPB     1 g 100 mL/hr over 30 Minutes Intravenous Every 24 hours 05/22/13 0905         PHYSICAL EXAM: Vital signs in last 24 hours: Filed Vitals:   05/24/13 1421 05/24/13 1700 05/24/13 2100 05/25/13 0959  BP: 136/79 142/77 148/69 172/43  Pulse:  89 46   Temp:  97.9 F (36.6 C) 98 F (36.7 C)   TempSrc:  Oral Oral   Resp:  18 18   Height:      Weight:      SpO2:  96% 99%     Weight change:  Filed Weights   05/22/13 0500 05/23/13 0436 05/24/13 0500  Weight: 90 kg (198 lb 6.6 oz) 89.9 kg (198 lb 3.1 oz) 90 kg (198 lb 6.6 oz)   Body mass index is 35.16 kg/(m^2).   Gen Exam: Awake and alert with clear speech.   Neck: Supple, No JVD.   Chest: B/L Clear.   CVS: S1 S2 Regular, no murmurs.  Abdomen: soft, BS +, non tender, non distended.  Extremities: no edema, lower extremities warm to touch. Neurologic:  Non Focal-but gen weakness   Skin: No Rash.   Wounds: N/A.    Intake/Output from previous day:  Intake/Output Summary (Last 24 hours) at 05/25/13 1139 Last data filed at 05/25/13 1107  Gross per 24 hour  Intake   2350 ml  Output   1026 ml  Net   1324 ml     LAB RESULTS: CBC  Recent Labs Lab 05/20/13 2347 05/21/13 0412 05/22/13 0410 05/24/13 0546  WBC 5.7 5.9 4.9 6.9  HGB 11.0* 10.5* 10.2* 11.2*  HCT 33.4* 32.5* 32.3* 35.4*  PLT 231 242 207 218  MCV 81.3 81.7 83.5 82.7  MCH 26.8 26.4 26.4 26.2  MCHC 32.9 32.3 31.6 31.6  RDW 15.3 15.4 15.7* 15.2  LYMPHSABS 1.4  --   --   --   MONOABS 0.7  --   --   --   EOSABS 0.2  --   --   --   BASOSABS 0.0  --   --   --     Chemistries   Recent Labs Lab 05/21/13 1705 05/22/13 0410 05/22/13 1705 05/23/13 0330 05/24/13 0546  NA 135 138 135 135 135  K 4.9 5.5* 5.4* 5.2* 5.2*  CL 104 108 107 104 106  CO2 21 23 19  18* 18*  GLUCOSE 152* 151* 222* 227* 243*  BUN 57* 47* 36* 32* 19  CREATININE 3.13* 2.60* 2.19* 2.09* 1.65*  CALCIUM 9.0 8.9 9.3 9.2 9.4    CBG:  Recent Labs Lab 05/24/13 0820 05/24/13 1149 05/24/13 1825 05/24/13 2104 05/25/13 0821  GLUCAP 206* 207* 231* 241* 189*    GFR Estimated Creatinine Clearance: 29.9 ml/min (by C-G formula based on Cr of 1.65).  Coagulation profile No results found for this basename: INR, PROTIME,  in the last 168 hours  Cardiac Enzymes No results found for this basename: CK, CKMB, TROPONINI, MYOGLOBIN,  in the last 168 hours  No components found with this basename: POCBNP,  No results found for this basename: DDIMER,  in the last 72 hours No results found for this basename: HGBA1C,  in the last 72 hours No results found for this basename: CHOL, HDL, LDLCALC, TRIG, CHOLHDL, LDLDIRECT,  in the last 72 hours No results found for this basename: TSH, T4TOTAL, FREET3, T3FREE, THYROIDAB,  in the last 72 hours No results found for this basename: VITAMINB12, FOLATE, FERRITIN,  TIBC, IRON, RETICCTPCT,  in the last 72 hours No results found for this basename: LIPASE, AMYLASE,  in the last 72 hours  Urine Studies No results found for this basename: UACOL, UAPR, USPG, UPH, UTP, UGL, UKET, UBIL, UHGB, UNIT, UROB, ULEU, UEPI, UWBC, URBC, UBAC, CAST, CRYS, UCOM, BILUA,  in  the last 72 hours  MICROBIOLOGY: Recent Results (from the past 240 hour(s))  MRSA PCR SCREENING     Status: None   Collection Time    05/20/13  7:51 PM      Result Value Range Status   MRSA by PCR NEGATIVE  NEGATIVE Final   Comment:            The GeneXpert MRSA Assay (FDA     approved for NASAL specimens     only), is one component of a     comprehensive MRSA colonization     surveillance program. It is not     intended to diagnose MRSA     infection nor to guide or     monitor treatment for     MRSA infections.  URINE CULTURE     Status: None   Collection Time    05/21/13 10:06 AM      Result Value Range Status   Specimen Description URINE, RANDOM   Final   Special Requests NONE   Final   Culture  Setup Time     Final   Value: 05/21/2013 17:44     Performed at [facility] Count     Final   Value: >=100,000 COLONIES/ML     Performed at [facility]   Culture     Final   Value: CITROBACTER FREUNDII     Performed at [facility]   Report Status 05/23/2013 FINAL   Final   Organism ID, Bacteria CITROBACTER FREUNDII   Final    RADIOLOGY STUDIES/RESULTS: US Renal  05/21/2013   *RADIOLOGY REPORT*  Retroperitoneal ultrasound  History:  Acute renal failure  Comparison:  June 21, 2012  Findings: Right kidney measures 10.9 cm in length.  Left kidney measures 11.0 cm in length.  There is no renal mass, pelvicaliectasis, or perinephric fluid collection on either side. There is no demonstrable calculus or ureterectasis on either side. Renal cortical thickness is low normal but stable bilaterally. Renal echogenicity is normal bilaterally.  There is a  prominent column of Bertin on each side, an anatomic variant.  No lesion is seen by ultrasound in the region of the urinary bladder.  Conclusion:  Study within normal limits and stable.   Original Report Authenticated By: Bretta Bang, M.D.   Dg Chest Port 1 View  05/20/2013   *RADIOLOGY REPORT*  Clinical Data: Chest discomfort, shortness of breath, history of hypertension  PORTABLE CHEST - 1 VIEW  Comparison: 05/20/2013; 05/08/2013  Findings:  Grossly unchanged cardiac silhouette and mediastinal contours with atherosclerotic calcifications within the thoracic aorta.  The lungs appear hyperexpanded with flattening of the bilateral hemidiaphragms.  Minimal bibasilar opacities, left greater than right.  No focal airspace opacity.  No definite pleural effusion or pneumothorax.  No definite evidence of edema.  Unchanged bones.  IMPRESSION: Minimal bibasilar atelectasis without definite acute cardiopulmonary disease on this low lung volumes AP portable examination.  Further evaluation with a PA and lateral chest radiograph may be obtained as clinically indicated.   Original Report Authenticated By: Tacey Ruiz, MD    Jeoffrey Massed, MD  [facility] Pager:[phone]  If 7PM-7AM, please contact night-coverage [email or web address] Password [facility] 05/25/2013, 11:39 AM   LOS: 5 days

## 2013-05-26 LAB — BASIC METABOLIC PANEL
CO2: 18 mEq/L — ABNORMAL LOW (ref 19–32)
Calcium: 9.1 mg/dL (ref 8.4–10.5)
Glucose, Bld: 226 mg/dL — ABNORMAL HIGH (ref 70–99)
Sodium: 136 mEq/L (ref 135–145)

## 2013-05-26 LAB — GLUCOSE, CAPILLARY
Glucose-Capillary: 205 mg/dL — ABNORMAL HIGH (ref 70–99)
Glucose-Capillary: 162 mg/dL — ABNORMAL HIGH (ref 70–99)

## 2013-05-26 NOTE — Progress Notes (Signed)
PATIENT DETAILS Name: Becky Collins Age: 79 y.o. Sex: female Date of Birth: 16-Apr-1932 Admit Date: 05/20/2013 Admitting Physician Christiane Ha, MD ZOX:WRUEAV, Caryn Bee, MD  Subjective: Still with gen weakness-but anxious to go home  Assessment/Plan: Acute renal failure with hyperkalemia  -Scr steady trend down- BUN normal  -renal US normal without medical renal dz or hydronephrosis  -OP PCP notes show pt on chronic Bactrim - now on hold 2/2 ARF  -suspect combo of ACE I with Bactrim and new increased dose of Lasix cause of ARF   Orthostatic hypotension  - due to use of Lasix as outpt?  - OVS positive  -follow orthostatics and adjust medications accordingly-decrease amlodipine back to 5 mg, place thigh high ted hose  Mobitz (type) I (Wenckebach's) atrioventricular block  -Cards managing-has not resolved but note increase in HR  -has been symptomatic with dizziness and near syncope at home  -no indication for PPM at this time per cards -pt reportedly has NOT been on Depakote for several months so doubt this contributing to bradycardia   HTN (hypertension)  -BP moderately controlled  -pre admit ACE I on hold  - ACE I aon hold due to ARF and CRF  -BP fluctuating-on Amlodipine-continue-may need to adjust dosing depending on orthostatics-will decrease back to 5 mg and monitor  Moderate MR  -found on ECHO this admit  -follow   DM  -had issues with hypoglycemia likely due to decreased renal clearance so Lantus held  -slolwy resumed Lantus but at very low HS dose and cont SSI, now at 20 units. - pt takes a strange regimen of Lantus 20 in AM and 60 in PM-likely will need to re think dc dose   HLD (hyperlipidemia)  Citrobacter freundii UTI  - > 100,000 colonies and resistant to BACTRIM which she was taking for prophylaxis against UTIs  - will give 5-7 day course only.   Seizure disorder  -old notes from this facility show pt previously on Depakote - pharmacist confirms with  family remote h/o seizures and several months ago was taken off Depakote without recurrence of seizures   Hx of CVA -c/w ASA  Disposition: Remain inpatient-SNF on 8/18  DVT Prophylaxis: Prophylactic Heparin   Code Status: Full code   Family Communication Son at bedside  Procedures:  None  CONSULTS:  cardiology   MEDICATIONS: Scheduled Meds: . amLODipine  5 mg Oral Daily  . aspirin EC  81 mg Oral QHS  . brimonidine  1 drop Both Eyes BID  . cefTRIAXone (ROCEPHIN)  IV  1 g Intravenous Q24H  . gabapentin  700 mg Oral BID  . heparin subcutaneous  5,000 Units Subcutaneous Q8H  . insulin aspart  0-9 Units Subcutaneous TID WC  . insulin glargine  20 Units Subcutaneous QHS  . pantoprazole  40 mg Oral Daily  . sertraline  100 mg Oral Daily  . sodium chloride  3 mL Intravenous Q12H   Continuous Infusions: . sodium chloride 1,000 mL (05/26/13 0830)   PRN Meds:.acetaminophen, acetaminophen, LORazepam, morphine injection, MUSCLE RUB, ondansetron (ZOFRAN) IV, ondansetron  Antibiotics: Anti-infectives   Start     Dose/Rate Route Frequency Ordered Stop   05/22/13 1000  cefTRIAXone (ROCEPHIN) 1 g in dextrose 5 % 50 mL IVPB     1 g 100 mL/hr over 30 Minutes Intravenous Every 24 hours 05/22/13 0905         PHYSICAL EXAM: Vital signs in last 24 hours: Filed Vitals:   05/25/13 1857 05/25/13 1859 05/25/13  2122 05/26/13 0500  BP: 150/54 136/85 181/48 146/64  Pulse: 56 66 49 45  Temp:   97.9 F (36.6 C) 98.2 F (36.8 C)  TempSrc:      Resp:   16 16  Height:      Weight:    90.946 kg (200 lb 8 oz)  SpO2:   96% 98%    Weight change:  Filed Weights   05/23/13 0436 05/24/13 0500 05/26/13 0500  Weight: 89.9 kg (198 lb 3.1 oz) 90 kg (198 lb 6.6 oz) 90.946 kg (200 lb 8 oz)   Body mass index is 35.53 kg/(m^2).   Gen Exam: Awake and alert with clear speech.   Neck: Supple, No JVD.   Chest: B/L Clear.   CVS: S1 S2 Regular, no murmurs.  Abdomen: soft, BS +, non tender,  non distended.  Extremities: no edema, lower extremities warm to touch. Neurologic: Non Focal-but gen weakness   Skin: No Rash.   Wounds: N/A.    Intake/Output from previous day:  Intake/Output Summary (Last 24 hours) at 05/26/13 0934 Last data filed at 05/26/13 0600  Gross per 24 hour  Intake 2667.5 ml  Output      1 ml  Net 2666.5 ml     LAB RESULTS: CBC  Recent Labs Lab 05/20/13 2347 05/21/13 0412 05/22/13 0410 05/24/13 0546  WBC 5.7 5.9 4.9 6.9  HGB 11.0* 10.5* 10.2* 11.2*  HCT 33.4* 32.5* 32.3* 35.4*  PLT 231 242 207 218  MCV 81.3 81.7 83.5 82.7  MCH 26.8 26.4 26.4 26.2  MCHC 32.9 32.3 31.6 31.6  RDW 15.3 15.4 15.7* 15.2  LYMPHSABS 1.4  --   --   --   MONOABS 0.7  --   --   --   EOSABS 0.2  --   --   --   BASOSABS 0.0  --   --   --     Chemistries   Recent Labs Lab 05/22/13 0410 05/22/13 1705 05/23/13 0330 05/24/13 0546 05/26/13 0420  NA 138 135 135 135 136  K 5.5* 5.4* 5.2* 5.2* 4.5  CL 108 107 104 106 108  CO2 23 19 18* 18* 18*  GLUCOSE 151* 222* 227* 243* 226*  BUN 47* 36* 32* 19 18  CREATININE 2.60* 2.19* 2.09* 1.65* 1.55*  CALCIUM 8.9 9.3 9.2 9.4 9.1    CBG:  Recent Labs Lab 05/25/13 0821 05/25/13 1158 05/25/13 1618 05/25/13 2120 05/26/13 0739  GLUCAP 189* 187* 167* 194* 233*    GFR Estimated Creatinine Clearance: 32 ml/min (by C-G formula based on Cr of 1.55).  Coagulation profile No results found for this basename: INR, PROTIME,  in the last 168 hours  Cardiac Enzymes No results found for this basename: CK, CKMB, TROPONINI, MYOGLOBIN,  in the last 168 hours  No components found with this basename: POCBNP,  No results found for this basename: DDIMER,  in the last 72 hours No results found for this basename: HGBA1C,  in the last 72 hours No results found for this basename: CHOL, HDL, LDLCALC, TRIG, CHOLHDL, LDLDIRECT,  in the last 72 hours No results found for this basename: TSH, T4TOTAL, FREET3, T3FREE, THYROIDAB,  in the  last 72 hours No results found for this basename: VITAMINB12, FOLATE, FERRITIN, TIBC, IRON, RETICCTPCT,  in the last 72 hours No results found for this basename: LIPASE, AMYLASE,  in the last 72 hours  Urine Studies No results found for this basename: UACOL, UAPR, USPG, UPH, UTP, UGL, UKET, UBIL,  UHGB, UNIT, UROB, ULEU, UEPI, UWBC, URBC, UBAC, CAST, CRYS, UCOM, BILUA,  in the last 72 hours  MICROBIOLOGY: Recent Results (from the past 240 hour(s))  MRSA PCR SCREENING     Status: None   Collection Time    05/20/13  7:51 PM      Result Value Range Status   MRSA by PCR NEGATIVE  NEGATIVE Final   Comment:            The GeneXpert MRSA Assay (FDA     approved for NASAL specimens     only), is one component of a     comprehensive MRSA colonization     surveillance program. It is not     intended to diagnose MRSA     infection nor to guide or     monitor treatment for     MRSA infections.  URINE CULTURE     Status: None   Collection Time    05/21/13 10:06 AM      Result Value Range Status   Specimen Description URINE, RANDOM   Final   Special Requests NONE   Final   Culture  Setup Time     Final   Value: 05/21/2013 17:44     Performed at [facility] Count     Final   Value: >=100,000 COLONIES/ML     Performed at [facility]   Culture     Final   Value: CITROBACTER FREUNDII     Performed at [facility]   Report Status 05/23/2013 FINAL   Final   Organism ID, Bacteria CITROBACTER FREUNDII   Final    RADIOLOGY STUDIES/RESULTS: US Renal  05/21/2013   *RADIOLOGY REPORT*  Retroperitoneal ultrasound  History:  Acute renal failure  Comparison:  June 21, 2012  Findings: Right kidney measures 10.9 cm in length.  Left kidney measures 11.0 cm in length.  There is no renal mass, pelvicaliectasis, or perinephric fluid collection on either side. There is no demonstrable calculus or ureterectasis on either side. Renal cortical thickness is low normal but  stable bilaterally. Renal echogenicity is normal bilaterally.  There is a prominent column of Bertin on each side, an anatomic variant.  No lesion is seen by ultrasound in the region of the urinary bladder.  Conclusion:  Study within normal limits and stable.   Original Report Authenticated By: Bretta Bang, M.D.   Dg Chest Port 1 View  05/20/2013   *RADIOLOGY REPORT*  Clinical Data: Chest discomfort, shortness of breath, history of hypertension  PORTABLE CHEST - 1 VIEW  Comparison: 05/20/2013; 05/08/2013  Findings:  Grossly unchanged cardiac silhouette and mediastinal contours with atherosclerotic calcifications within the thoracic aorta.  The lungs appear hyperexpanded with flattening of the bilateral hemidiaphragms.  Minimal bibasilar opacities, left greater than right.  No focal airspace opacity.  No definite pleural effusion or pneumothorax.  No definite evidence of edema.  Unchanged bones.  IMPRESSION: Minimal bibasilar atelectasis without definite acute cardiopulmonary disease on this low lung volumes AP portable examination.  Further evaluation with a PA and lateral chest radiograph may be obtained as clinically indicated.   Original Report Authenticated By: Tacey Ruiz, MD    Jeoffrey Massed, MD  [facility] Pager:[phone]  If 7PM-7AM, please contact night-coverage [email or web address] Password [facility] 05/26/2013, 9:34 AM   LOS: 6 days

## 2013-05-27 ENCOUNTER — Inpatient Hospital Stay (HOSPITAL_COMMUNITY): Payer: Medicare Other

## 2013-05-27 LAB — PRO B NATRIURETIC PEPTIDE: Pro B Natriuretic peptide (BNP): 608.1 pg/mL — ABNORMAL HIGH (ref 0–450)

## 2013-05-27 LAB — GLUCOSE, CAPILLARY
Glucose-Capillary: 202 mg/dL — ABNORMAL HIGH (ref 70–99)
Glucose-Capillary: 212 mg/dL — ABNORMAL HIGH (ref 70–99)

## 2013-05-27 MED ORDER — INSULIN ASPART 100 UNIT/ML ~~LOC~~ SOLN: SUBCUTANEOUS | Status: DC

## 2013-05-27 MED ORDER — CIPROFLOXACIN HCL 500 MG PO TABS: 500.0000 mg | ORAL_TABLET | Freq: Every day | ORAL | Status: DC

## 2013-05-27 MED ORDER — AMLODIPINE BESYLATE 5 MG PO TABS: 5.0000 mg | ORAL_TABLET | Freq: Every day | ORAL | Status: DC

## 2013-05-27 MED ORDER — INSULIN GLARGINE 100 UNIT/ML ~~LOC~~ SOLN: 20.0000 [IU] | Freq: Every day | SUBCUTANEOUS | Status: DC

## 2013-05-27 MED ORDER — FUROSEMIDE 40 MG PO TABS: 40.0000 mg | ORAL_TABLET | Freq: Every day | ORAL | Status: DC

## 2013-05-27 MED ORDER — GABAPENTIN 100 MG PO CAPS: 700.0000 mg | ORAL_CAPSULE | Freq: Two times a day (BID) | ORAL | Status: DC

## 2013-05-27 MED ORDER — FUROSEMIDE 40 MG PO TABS
40.0000 mg | ORAL_TABLET | Freq: Every day | ORAL | Status: DC
  Administered 2013-05-27: 40 mg via ORAL
  Filled 2013-05-27: qty 1

## 2013-05-27 MED ORDER — LORAZEPAM 0.5 MG PO TABS: 0.5000 mg | ORAL_TABLET | Freq: Every day | ORAL | Status: DC | PRN

## 2013-05-27 NOTE — Progress Notes (Signed)
CSW placed pt discharge packet with shadow chart. CSW informed pt family and facility of dc. Transportation arranged with [facility] and pt nurse notified. CSW signing off.  Nguyet Mercer, LCSWA [phone]

## 2013-05-27 NOTE — Progress Notes (Signed)
Physical Therapy Treatment Patient Details Name: Becky Collins MRN: [id number] DOB: 1932/06/10 Today's Date: 05/27/2013 Time: 4098-1191 PT Time Calculation (min): 13 min  PT Assessment / Plan / Recommendation  History of Present Illness 79 year old female patient with baseline creatinine 1.7, also known hypertension, ?? seizure disorder, Mobitz type 1 AVB and diabetes. Presented to [facility] emergency department with complaints of weakness. No other associated symptoms.  Pt found to be have   Mobitz (type) I (Wenckebach's) atrioventricular block.  Per MD note mobility is key.    PT Comments   Pt progressed well with mobility to bathroom for the first time walking today.  She still has symptoms of lightheadedness, but was able to make it the short distance with min assist.  "I am give out" per pt after making it to the bathroom.  She is appropriate for SNF placement for rehab at discharge as she is not strong enough to go home in current physical condition.    Follow Up Recommendations  SNF     Does the patient have the potential to tolerate intense rehabilitation    NA  Barriers to Discharge   None      Equipment Recommendations  None recommended by PT    Recommendations for Other Services   None  Frequency Min 2X/week   Progress towards PT Goals Progress towards PT goals: Progressing toward goals  Plan Discharge plan needs to be updated;Frequency needs to be updated    Precautions / Restrictions Precautions Precautions: Fall Restrictions Weight Bearing Restrictions: No   Pertinent Vitals/Pain See vitals flow sheet.     Mobility  Bed Mobility Bed Mobility: Not assessed (pt OOB in recliner chair) Supine to Sit: 5: Supervision;HOB elevated Sitting - Scoot to Edge of Bed: 4: Min guard Transfers Sit to Stand: 4: Min assist;With upper extremity assist;With armrests;From chair/3-in-1 Stand to Sit: 4: Min assist;With upper extremity assist;With armrests;To toilet Details  for Transfer Assistance: min assist to support trunk during transitions Ambulation/Gait Ambulation/Gait Assistance: 4: Min assist Ambulation Distance (Feet): 15 Feet Assistive device: Rolling walker Ambulation/Gait Assistance Details: min assist to support pt for balance and safety while walking.  Pt gets lightheaded in standing, but was able to make it the short distance to the bathroom without getting too weak.   Gait Pattern: Step-through pattern;Shuffle      PT Goals (current goals can now be found in the care plan section) Acute Rehab PT Goals Patient Stated Goal: to go home PT Goal Formulation: With patient  Visit Information  Last PT Received On: 05/27/13 Assistance Needed: +2 (safety (chair to follow)) History of Present Illness: 79 year old female patient with baseline creatinine 1.7, also known hypertension, ?? seizure disorder, Mobitz type 1 AVB and diabetes. Presented to [facility] emergency department with complaints of weakness. No other associated symptoms.  Pt found to be have   Mobitz (type) I (Wenckebach's) atrioventricular block.  Per MD note mobility is key.     Subjective Data  Subjective: Pt reports needing to go to the bathroom and she wants to try to walk there with RW (has not walked to bathroom yet during her hospital stay).   Patient Stated Goal: to go home   Cognition  Cognition Arousal/Alertness: Awake/alert Behavior During Therapy: WFL for tasks assessed/performed Overall Cognitive Status: Within Functional Limits for tasks assessed       End of Session PT - End of Session Activity Tolerance: Patient limited by fatigue Patient left: in bed;Other (comment) (on gurney with transporters)  Rollene Rotunda Mellony Danziger, PT, DPT [phone]   05/27/2013, 5:10 PM

## 2013-05-27 NOTE — Progress Notes (Addendum)
Clinical Social Work Department CLINICAL SOCIAL WORK PLACEMENT NOTE 05/27/2013  Patient:  Becky Collins, Becky Collins  Account Number:  [id number] Admit date:  05/20/2013  Clinical Social Worker:  Harless Nakayama  Date/time:  05/27/2013 08:55 AM  Clinical Social Work is seeking post-discharge placement for this patient at the following level of care:   SKILLED NURSING   (*CSW will update this form in Epic as items are completed)   05/27/2013  Patient/family provided with [facility] Department of Clinical Social Work's list of facilities offering this level of care within the geographic area requested by the patient (or if unable, by the patient's family).  05/27/2013  Patient/family informed of their freedom to choose among providers that offer the needed level of care, that participate in Medicare, Medicaid or managed care program needed by the patient, have an available bed and are willing to accept the patient.  05/27/2013  Patient/family informed of [facility]' ownership interest in [facility], as well as of the fact that they are under no obligation to receive care at this facility.  PASARR submitted to EDS on  PASARR number received from EDS on  Existing PASARR  FL2 transmitted to all facilities in geographic area requested by pt/family on  05/27/2013 FL2 transmitted to all facilities within larger geographic area on   Patient informed that his/her managed care company has contracts with or will negotiate with  certain facilities, including the following:     Patient/family informed of bed offers received: 05/27/2013  Patient chooses bed at [facility] Physician recommends and patient chooses bed at    Patient to be transferred to [facility] on 05/27/2013   Patient to be transferred to facility by [facility]  The following physician request were entered in Epic:   Additional Comments:

## 2013-05-27 NOTE — Discharge Summary (Addendum)
PATIENT DETAILS Name: Becky Collins Age: 79 y.o. Sex: female Date of Birth: [birth year] MRN: [id number]. Admit Date: 05/20/2013 Admitting Physician: Christiane Ha, MD WUJ:[id number], Caryn Bee, MD  Recommendations for Outpatient Follow-up:  1. Has stable Mobitz type I  rhythm-please monitor and refer to cardiology if necessary. 2. Optimize BP control-still somewhat orthostatic-on low-dose amlodipine, Lasix, lisinopril are currently on hold. 3. Optimize diabetic regimen-currently on significantly lower doses of Lantus than prior to admission. 4. Recheck electrolytes in one week-Lasix will be started on discharge 5. Cardiology followup with Dr. Myrtis Ser arranged for 8/25 6. Please assess volume status closely  PRIMARY DISCHARGE DIAGNOSIS:  Principal Problem:   Mobitz (type) I (Wenckebach's) atrioventricular block Active Problems:   HTN (hypertension)   HLD (hyperlipidemia)   DM (diabetes mellitus)   Hyperkalemia   Acute renal failure   Abnormal urinalysis   Seizure disorder   Hypoglycemia      PAST MEDICAL HISTORY: Past Medical History  Diagnosis Date  . Hypertension     moderate left ventricular hypertrophy  . Diabetes mellitus     h/o negative exercise stress test/normal LVF assessed June 2011 by echo EF 60-65%  . Hyperlipidemia   . Carotid artery disease     with less than 60% stenosis on the right in 2006  . History of seizure disorder     on depakote  . History of recurrent UTIs   . Chronic edema   . Depression   . Acid reflux disease   . History of medication noncompliance     this has been ongoing for quite sometime  . 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 Dysrhythmia     Second-degree AVB Mobitz 1, 11/12; history of PAT  . Chronic kidney disease   . Stroke     DISCHARGE MEDICATIONS:   Medication List    STOP taking these medications       glimepiride 2 MG tablet  Commonly known as:  AMARYL     lisinopril 20 MG tablet  Commonly known  as:  PRINIVIL,ZESTRIL     sulfamethoxazole-trimethoprim 800-160 MG per tablet  Commonly known as:  BACTRIM DS      TAKE these medications       amLODipine 5 MG tablet  Commonly known as:  NORVASC  Take 1 tablet (5 mg total) by mouth daily.     aspirin 81 MG tablet  Take 81 mg by mouth at bedtime.     brimonidine 0.2 % ophthalmic solution  Commonly known as:  ALPHAGAN  Place 1 drop into both eyes 2 (two) times daily.     ciprofloxacin 500 MG tablet  Commonly known as:  CIPRO  Take 1 tablet (500 mg total) by mouth daily. For 3 more days from 05/27/13     furosemide 40 MG tablet  Commonly known as:  LASIX  Take 1 tablet (40 mg total) by mouth daily.     gabapentin 100 MG capsule  Commonly known as:  NEURONTIN  Take 7 capsules (700 mg total) by mouth 2 (two) times daily.     insulin aspart 100 UNIT/ML injection  Commonly known as:  novoLOG  - 0-9 Units, Subcutaneous, 3 times daily with meals  - CBG < 70: implement hypoglycemia protocol  - CBG 70 - 120: 0 units  - CBG 121 - 150: 1 unit  - CBG 151 - 200: 2 units  - CBG 201 - 250: 3 units  -  CBG 251 - 300: 5 units  - CBG 301 - 350: 7 units  - CBG 351 - 400: 9 units  - CBG > 400: call MD and obtain STAT lab verification     insulin glargine 100 UNIT/ML injection  Commonly known as:  LANTUS  Inject 0.2 mL (20 Units total) into the skin at bedtime.     LORazepam 0.5 MG tablet  Commonly known as:  ATIVAN  Take 1 tablet (0.5 mg total) by mouth daily as needed. For anxiety     omeprazole 20 MG capsule  Commonly known as:  PRILOSEC  Take 20 mg by mouth every morning.     sertraline 100 MG tablet  Commonly known as:  ZOLOFT  Take 100 mg by mouth every morning.        ALLERGIES:  No Known Allergies  BRIEF HPI:  See H&P, Labs, Consult and Test reports for all details in brief, 79 year old female patient with baseline creatinine 1.7, also known hypertension, ?? seizure disorder, Mobitz type 1 AVB and diabetes.  Presented to [facility] emergency department with complaints of weakness. No other associated symptoms.  Recently evaluated on 05/08/2013 at primary care physician's office for increased peripheral edema, Lasix dosage increased. In the ER patient was found to have acute renal failure with hyperkalemia with a creatinine of 4.9 and potassium 6.2.  CONSULTATIONS:   cardiology  PERTINENT RADIOLOGIC STUDIES: US Renal  05/21/2013   *RADIOLOGY REPORT*  Retroperitoneal ultrasound  History:  Acute renal failure  Comparison:  June 21, 2012  Findings: Right kidney measures 10.9 cm in length.  Left kidney measures 11.0 cm in length.  There is no renal mass, pelvicaliectasis, or perinephric fluid collection on either side. There is no demonstrable calculus or ureterectasis on either side. Renal cortical thickness is low normal but stable bilaterally. Renal echogenicity is normal bilaterally.  There is a prominent column of Bertin on each side, an anatomic variant.  No lesion is seen by ultrasound in the region of the urinary bladder.  Conclusion:  Study within normal limits and stable.   Original Report Authenticated By: Bretta Bang, M.D.   Dg Chest Port 1 View  05/20/2013   *RADIOLOGY REPORT*  Clinical Data: Chest discomfort, shortness of breath, history of hypertension  PORTABLE CHEST - 1 VIEW  Comparison: 05/20/2013; 05/08/2013  Findings:  Grossly unchanged cardiac silhouette and mediastinal contours with atherosclerotic calcifications within the thoracic aorta.  The lungs appear hyperexpanded with flattening of the bilateral hemidiaphragms.  Minimal bibasilar opacities, left greater than right.  No focal airspace opacity.  No definite pleural effusion or pneumothorax.  No definite evidence of edema.  Unchanged bones.  IMPRESSION: Minimal bibasilar atelectasis without definite acute cardiopulmonary disease on this low lung volumes AP portable examination.  Further evaluation with a PA and lateral  chest radiograph may be obtained as clinically indicated.   Original Report Authenticated By: Tacey Ruiz, MD     PERTINENT LAB RESULTS: CBC: No results found for this basename: WBC, HGB, HCT, PLT,  in the last 72 hours CMET CMP     Component Value Date/Time   NA 136 05/26/2013 0420   K 4.5 05/26/2013 0420   CL 108 05/26/2013 0420   CO2 18* 05/26/2013 0420   GLUCOSE 226* 05/26/2013 0420   BUN 18 05/26/2013 0420   CREATININE 1.55* 05/26/2013 0420   CALCIUM 9.1 05/26/2013 0420   PROT 6.7 05/20/2013 2347   ALBUMIN 3.4* 05/20/2013 2347   AST 27 05/20/2013  2347   ALT 17 05/20/2013 2347   ALKPHOS 54 05/20/2013 2347   BILITOT 0.2* 05/20/2013 2347   GFRNONAA 31* 05/26/2013 0420   GFRAA 36* 05/26/2013 0420    GFR Estimated Creatinine Clearance: 32 ml/min (by C-G formula based on Cr of 1.55). No results found for this basename: LIPASE, AMYLASE,  in the last 72 hours No results found for this basename: CKTOTAL, CKMB, CKMBINDEX, TROPONINI,  in the last 72 hours No components found with this basename: POCBNP,  No results found for this basename: DDIMER,  in the last 72 hours No results found for this basename: HGBA1C,  in the last 72 hours No results found for this basename: CHOL, HDL, LDLCALC, TRIG, CHOLHDL, LDLDIRECT,  in the last 72 hours No results found for this basename: TSH, T4TOTAL, FREET3, T3FREE, THYROIDAB,  in the last 72 hours No results found for this basename: VITAMINB12, FOLATE, FERRITIN, TIBC, IRON, RETICCTPCT,  in the last 72 hours Coags: No results found for this basename: PT, INR,  in the last 72 hours Microbiology: Recent Results (from the past 240 hour(s))  MRSA PCR SCREENING     Status: None   Collection Time    05/20/13  7:51 PM      Result Value Range Status   MRSA by PCR NEGATIVE  NEGATIVE Final   Comment:            The GeneXpert MRSA Assay (FDA     approved for NASAL specimens     only), is one component of a     comprehensive MRSA colonization     surveillance  program. It is not     intended to diagnose MRSA     infection nor to guide or     monitor treatment for     MRSA infections.  URINE CULTURE     Status: None   Collection Time    05/21/13 10:06 AM      Result Value Range Status   Specimen Description URINE, RANDOM   Final   Special Requests NONE   Final   Culture  Setup Time     Final   Value: 05/21/2013 17:44     Performed at [facility] Count     Final   Value: >=100,000 COLONIES/ML     Performed at [facility]   Culture     Final   Value: CITROBACTER FREUNDII     Performed at [facility]   Report Status 05/23/2013 FINAL   Final   Organism ID, Bacteria CITROBACTER FREUNDII   Final     BRIEF HOSPITAL COURSE:   Principal Problem: Acute renal failure with hyperkalemia  - Patient was admitted, started on IV fluids. She was also given Kayexalate, IV insulin and dextrose for the hyponatremia. With these measures, renal failure improved. Creatinine on presentation was 4.3. Creatinine on discharge 1.5, close to her usual baseline. l -renal US normal without medical renal dz or hydronephrosis  -OP PCP notes show pt on chronic Bactrim - now on hold 2/2 ARF  -suspect combo of ACE I with Bactrim and new increased dose of Lasix cause of ARF   Orthostatic hypotension  - due to use of Lasix as outpt?, Given history of diabetes could have autonomic dysfunction as well. -follow orthostatics and adjust medications accordingly- unfortunately she has received IV fluids here in the hospital to reverse acute renal failure, she has now started to become slightly volume overloaded, chest x-ray done  today does not show any pulmonary edema, although she is orthostatic, she is at risk of having significant fluid overload-as a result we will restart Lasix, but at a reduced dose of 40 mg daily. We'll not restart any of her other antihypertensive medications as of yet.  - She will need close monitoring of this in the  outpatient setting at optimization of her medications.   Mobitz (type) I (Wenckebach's) atrioventricular block  -Cards/EPS was consulted, per cardiology with a h the high 80s to 90s and stable blood pressure, hemodynamics of the rhythm not impressive enough, there was no indication to place a permanent pacemaker at this time. Her dizziness is probably secondary to orthostatic hypotension rather than Wenckebach's heart block.  -has been symptomatic with dizziness and near syncope at home- suspect this is from orthostatic hypotension as indicated above.  -pt reportedly has NOT been on Depakote for several months so doubt this contributing to bradycardia   HTN (hypertension)  -BP moderately controlled  -pre admit ACE I on hold  do to acute renal failure -BP fluctuating-on low-dose Amlodipine-continue-may need to adjust dosing depending on orthostatics-  Moderate MR  -found on ECHO this admit  -follow  as outpatient   DM  -had issues with hypoglycemia likely due to decreased renal clearance so Lantus held initially - Currently CBGs stable on 20 units of Lantus and SSI-will continue with this dosing on discharge. Further optimization can be done in the outpatient setting. -   Citrobacter freundii UTI  - > 100,000 colonies and resistant to BACTRIM which she was taking for prophylaxis against UTIs  -  she was given IV Rocephin during her inpatient stay, she will be transitioned to ciprofloxacin for 3 days from 8/18. She was previously on Bactrim for prophylaxis-given hyperkalemia/acute renal failure would not restart this.   Seizure disorder  -old notes from this facility show pt previously on Depakote - pharmacist confirms with family remote h/o seizures and several months ago was taken off Depakote without recurrence of seizures   Hx of CVA  -c/w ASA   TODAY-DAY OF DISCHARGE:  Subjective:   Karolynn Infantino today has no headache,no chest abdominal pain,no new weakness tingling or numbness,  feels much better wants to go home today.   Objective:   Blood pressure 135/77, pulse 93, temperature 98.8 F (37.1 C), temperature source Oral, resp. rate 18, height 5\' 3"  (1.6 m), weight 90.946 kg (200 lb 8 oz), SpO2 97.00%.  Intake/Output Summary (Last 24 hours) at 05/27/13 1120 Last data filed at 05/27/13 0800  Gross per 24 hour  Intake    360 ml  Output   1101 ml  Net   -741 ml   Filed Weights   05/23/13 0436 05/24/13 0500 05/26/13 0500  Weight: 89.9 kg (198 lb 3.1 oz) 90 kg (198 lb 6.6 oz) 90.946 kg (200 lb 8 oz)    Exam Awake Alert, Oriented *3, No new F.N deficits, Normal affect [place].AT,PERRAL Supple Neck,No JVD, No cervical lymphadenopathy appriciated.  Symmetrical Chest wall movement, Good air movement bilaterally, CTAB RRR,No Gallops,Rubs or new Murmurs, No Parasternal Heave +ve B.Sounds, Abd Soft, Non tender, No organomegaly appriciated, No rebound -guarding or rigidity. No Cyanosis, Clubbing or edema, No new Rash or bruise  DISCHARGE CONDITION: Stable  DISPOSITION: SNF   DISCHARGE INSTRUCTIONS:    Activity:  As tolerated with Full fall precautions use walker/cane & assistance as needed Standard fall precautions given still orthostatic  Diet recommendation: Diabetic Diet Heart Healthy diet  Discharge Orders   Future Appointments Provider Department Dept Phone   06/03/2013 2:45 PM Luis Abed, MD  [facility] Main Office [place]) [phone]   Future Orders Complete By Expires   Call MD for:  persistant dizziness or light-headedness  As directed    Diet - low sodium heart healthy  As directed    Diet - low sodium heart healthy  As directed    Diet general  As directed    Increase activity slowly  As directed    Increase activity slowly  As directed       Follow-up Information   Follow up with Selinda Flavin, MD. Schedule an appointment as soon as possible for a visit in 1 week.   Specialty:  Family Medicine   Contact information:   [address] [place] [address] [phone]       Follow up with Willa Rough, MD. Schedule an appointment as soon as possible for a visit on 06/03/2013. (2:45 PM)    Specialty:  Cardiology   Contact information:   [address] [place] [address] [phone]       Total Time spent on discharge equals 45 minutes.  SignedJeoffrey Massed 05/27/2013 11:20 AM

## 2013-05-27 NOTE — Progress Notes (Signed)
CSW spoke with [facility] and they are able to offer a bed today. Pt was informed of bed offer as well as co-pay. Pt is agreeable. CSW left message with pt daughter, Boyd Kerbs, to inform. CSW will await phone call from daughter to find out when she will be able to fill out paperwork at facility and facilitate discharge with facility and pt nurse when appropriate.  Reana Chacko, LCSWA [phone]

## 2013-05-27 NOTE — Progress Notes (Signed)
Patient ID: Becky Collins, female   DOB: Mar 04, 1932, 79 y.o.   MRN: [id number]   SUBJECTIVE:  The patient is feeling better. She is stable. She came in with renal dysfunction. Her meds have been adjusted. She had previously been on a diuretic also. She has received fluids here in the hospital. Unfortunately she continues to be orthostatic. However on physical examination I believe she is fluid overloaded. Her rhythm continues to show 2-1 block. It is Mobitz type I. We feel that her underlying rate is adequate and that this is not causing any significant problem. She does not need a pacemaker at this time.  Filed Vitals:   05/27/13 0536 05/27/13 0929 05/27/13 0932 05/27/13 0935  BP: 161/66 186/53 152/50 135/77  Pulse: 101 42 42 93  Temp:      TempSrc:      Resp:      Height:      Weight:      SpO2:        Intake/Output Summary (Last 24 hours) at 05/27/13 1013 Last data filed at 05/27/13 0800  Gross per 24 hour  Intake    360 ml  Output   1101 ml  Net   -741 ml    LABS: Basic Metabolic Panel:  Recent Labs  14/78/29 0420  NA 136  K 4.5  CL 108  CO2 18*  GLUCOSE 226*  BUN 18  CREATININE 1.55*  CALCIUM 9.1   Liver Function Tests: No results found for this basename: AST, ALT, ALKPHOS, BILITOT, PROT, ALBUMIN,  in the last 72 hours No results found for this basename: LIPASE, AMYLASE,  in the last 72 hours CBC: No results found for this basename: WBC, NEUTROABS, HGB, HCT, MCV, PLT,  in the last 72 hours Cardiac Enzymes: No results found for this basename: CKTOTAL, CKMB, CKMBINDEX, TROPONINI,  in the last 72 hours BNP: No components found with this basename: POCBNP,  D-Dimer: No results found for this basename: DDIMER,  in the last 72 hours Hemoglobin A1C: No results found for this basename: HGBA1C,  in the last 72 hours Fasting Lipid Panel: No results found for this basename: CHOL, HDL, LDLCALC, TRIG, CHOLHDL, LDLDIRECT,  in the last 72 hours Thyroid Function Tests: No  results found for this basename: TSH, T4TOTAL, FREET3, T3FREE, THYROIDAB,  in the last 72 hours  RADIOLOGY: US Renal  05/21/2013   *RADIOLOGY REPORT*  Retroperitoneal ultrasound  History:  Acute renal failure  Comparison:  June 21, 2012  Findings: Right kidney measures 10.9 cm in length.  Left kidney measures 11.0 cm in length.  There is no renal mass, pelvicaliectasis, or perinephric fluid collection on either side. There is no demonstrable calculus or ureterectasis on either side. Renal cortical thickness is low normal but stable bilaterally. Renal echogenicity is normal bilaterally.  There is a prominent column of Bertin on each side, an anatomic variant.  No lesion is seen by ultrasound in the region of the urinary bladder.  Conclusion:  Study within normal limits and stable.   Original Report Authenticated By: Bretta Bang, M.D.   Dg Chest Port 1 View  05/20/2013   *RADIOLOGY REPORT*  Clinical Data: Chest discomfort, shortness of breath, history of hypertension  PORTABLE CHEST - 1 VIEW  Comparison: 05/20/2013; 05/08/2013  Findings:  Grossly unchanged cardiac silhouette and mediastinal contours with atherosclerotic calcifications within the thoracic aorta.  The lungs appear hyperexpanded with flattening of the bilateral hemidiaphragms.  Minimal bibasilar opacities, left greater than right.  No  focal airspace opacity.  No definite pleural effusion or pneumothorax.  No definite evidence of edema.  Unchanged bones.  IMPRESSION: Minimal bibasilar atelectasis without definite acute cardiopulmonary disease on this low lung volumes AP portable examination.  Further evaluation with a PA and lateral chest radiograph may be obtained as clinically indicated.   Original Report Authenticated By: Tacey Ruiz, MD    PHYSICAL EXAM  patient is oriented to person time and place. Affect is normal. There is no jugulovenous distention. There are diffuse rales. There is no respiratory distress. Cardiac exam  reveals S1 and S2. The abdomen is soft. There is 1+ peripheral edema.  TELEMETRY: I reviewed telemetry today May 27, 2013. There is sinus rhythm. There is evidence of Mobitz type I. At times, there is limited 2-1 block but would still be considered Mobitz type I.   ASSESSMENT AND PLAN:    Mobitz (type) I (Wenckebach's) atrioventricular block    This rhythm continues. It is felt that she does not need a pacemaker. It is very important that she not receive any medications that slow conduction across the AV node.    Acute renal failure    This is improved.   Orthostatic hypotension.    Unfortunately this clearly is a difficult issue here. It will be necessary to decrease her antihypertensive meds   Diastolic CHF   Clinically by my evaluation today I feel that the patient is fluid overloaded. I suggested a chest x-ray be obtained and consideration given to restarting her oral diuretic. In addition we will need to be sure that a post hospital appointment is made by our team before the patient leaves the hospital. She needs to be seen within a week of her discharge from the hospital. If the chest x-ray and further physical exams are consistent with volume overload today, it may be necessary to wait until tomorrow for discharge.   Willa Rough 05/27/2013 10:13 AM

## 2013-05-27 NOTE — Evaluation (Signed)
Occupational Therapy Evaluation Patient Details Name: Becky Collins MRN: [id number] DOB: 09-14-1932 Today's Date: 05/27/2013 Time: 9629-5284 OT Time Calculation (min): 25 min  OT Assessment / Plan / Recommendation History of present illness 79 year old female patient with baseline creatinine 1.7, also known hypertension, ?? seizure disorder, Mobitz type 1 AVB and diabetes. Presented to [facility] emergency department with complaints of weakness. No other associated symptoms.  Pt found to be have   Mobitz (type) I (Wenckebach's) atrioventricular block.  Per MD note mobility is key.    Clinical Impression   Pt presents with below problem list. Pt very fatigued during session. See below for BP. OT educated on energy conservation and pt performed grooming at sink as well as LB dressing.  All further OT needs can be met in the next venue of care.       OT Assessment  All further OT needs can be met in the next venue of care    Follow Up Recommendations  SNF;Supervision/Assistance - 24 hour    Barriers to Discharge      Equipment Recommendations  Other (comment) (defer to snf)    Recommendations for Other Services    Frequency  Min 2X/week    Precautions / Restrictions Precautions Precautions: Fall Restrictions Weight Bearing Restrictions: No   Pertinent Vitals/Pain No pain reported. BP supine: 146/46. Sitting on EOB: 128/19. After standing and sitting back on EOB: 126/52. After sitting in recliner chair after transfer: 131/35. Nurse notified.     ADL  Eating/Feeding: Independent Where Assessed - Eating/Feeding: Chair Grooming: Performed;Teeth care;Min guard Where Assessed - Grooming: Supported standing Upper Body Bathing: Minimal assistance Where Assessed - Upper Body Bathing: Supported sitting Lower Body Bathing: Moderate assistance Where Assessed - Lower Body Bathing: Supported sit to stand Upper Body Dressing: Minimal assistance Where Assessed - Upper Body Dressing:  Supported sitting Lower Body Dressing: Moderate assistance Where Assessed - Lower Body Dressing: Supported sit to stand Toilet Transfer: Simulated;Minimal assistance;Min Pension scheme manager Method: Sit to stand;Stand pivot Acupuncturist: Other (comment) (from bed to recliner chair) Toileting - Clothing Manipulation and Hygiene: Moderate assistance Where Assessed - Toileting Clothing Manipulation and Hygiene: Sit to stand from 3-in-1 or toilet Tub/Shower Transfer Method: Not assessed Equipment Used: Gait belt;Rolling walker Transfers/Ambulation Related to ADLs: Min guard for stand pivot transfer and Minguard for sit to stand from elevated bed/Min A from regular height bed and Min A for stand to sit transfer to recliner chair and regular height bed. Min guard for stand to sit to elevated bed.  ADL Comments: Pt became very fatigued during session. Pt brushed teeth standing at sink and also able to get socks on and off. Pt overall Mod A for LB ADLs. See BP in vitals section. OT educated on energy conservation techniques.    OT Diagnosis: Generalized weakness  OT Problem List: Decreased strength;Decreased activity tolerance;Impaired balance (sitting and/or standing);Decreased knowledge of use of DME or AE;Cardiopulmonary status limiting activity OT Treatment Interventions: Self-care/ADL training;Energy conservation;DME and/or AE instruction;Therapeutic activities;Patient/family education;Balance training   OT Goals(Current goals can be found in the care plan section) Acute Rehab OT Goals Patient Stated Goal: to go home  Visit Information  Last OT Received On: 05/27/13 Assistance Needed: +2 (for safety) History of Present Illness: 79 year old female patient with baseline creatinine 1.7, also known hypertension, ?? seizure disorder, Mobitz type 1 AVB and diabetes. Presented to [facility] emergency department with complaints of weakness. No other associated symptoms.  Pt found to  be have  Mobitz (type) I (Wenckebach's) atrioventricular block.  Per MD note mobility is key.        Prior Functioning     Home Living Family/patient expects to be discharged to:: Private residence Living Arrangements: Children (daughter) Available Help at Discharge: Family;Available 24 hours/day Type of Home: House Home Access: Level entry Home Layout: One level Home Equipment: Bedside commode;Walker - 2 wheels;Cane - single point;Wheelchair - manual Prior Function Level of Independence: Independent with assistive device(s) (uses cane for household and RW community distance) Communication Communication: No difficulties Dominant Hand: Right         Vision/Perception     Cognition  Cognition Arousal/Alertness: Awake/alert Behavior During Therapy: WFL for tasks assessed/performed Overall Cognitive Status: Within Functional Limits for tasks assessed    Extremity/Trunk Assessment Upper Extremity Assessment Upper Extremity Assessment: RUE deficits/detail;LUE deficits/detail RUE Deficits / Details: weakness-unable to raise arms to full ROM (shoulder flexion)  LUE Deficits / Details: weakness-unable to raise arms to full ROM (shoulder flexion)      Mobility Bed Mobility Bed Mobility: Supine to Sit;Sitting - Scoot to Edge of Bed Supine to Sit: 5: Supervision;HOB elevated Sitting - Scoot to [place] of Bed: 4: Min guard Transfers Transfers: Sit to Stand;Stand to Sit Sit to Stand: With upper extremity assist;From bed;From chair/3-in-1;4: Min assist;4: Min guard Stand to Sit: With upper extremity assist;To chair/3-in-1;To bed;4: Min assist;4: Min guard Details for Transfer Assistance: Cues for hand placement. Min A for sit <> stand from regular height bed and from recliner chair. Min guard for sit <> stand from elevated bed.      Exercise     Balance     End of Session OT - End of Session Equipment Utilized During Treatment: Gait belt;Rolling walker Activity Tolerance:  Patient limited by fatigue Patient left: in chair;with call bell/phone within reach;with family/visitor present Nurse Communication: Other (comment) (BP)  GO     Becky Collins OTR/L [phone] 05/27/2013, 2:45 PM

## 2013-05-31 ENCOUNTER — Encounter: Payer: Self-pay | Admitting: Cardiology

## 2013-05-31 DIAGNOSIS — R9430 Abnormal result of cardiovascular function study, unspecified: Secondary | ICD-10-CM | POA: Insufficient documentation

## 2013-05-31 DIAGNOSIS — I340 Nonrheumatic mitral (valve) insufficiency: Secondary | ICD-10-CM | POA: Insufficient documentation

## 2013-05-31 DIAGNOSIS — Z9114 Patient's other noncompliance with medication regimen: Secondary | ICD-10-CM | POA: Insufficient documentation

## 2013-05-31 DIAGNOSIS — Z8669 Personal history of other diseases of the nervous system and sense organs: Secondary | ICD-10-CM | POA: Insufficient documentation

## 2013-06-03 ENCOUNTER — Ambulatory Visit (INDEPENDENT_AMBULATORY_CARE_PROVIDER_SITE_OTHER): Payer: Medicare Other | Admitting: Cardiology

## 2013-06-03 ENCOUNTER — Encounter: Payer: Self-pay | Admitting: Cardiology

## 2013-06-03 VITALS — BP 146/68 | HR 72 | Ht 63.0 in | Wt 196.0 lb

## 2013-06-03 DIAGNOSIS — N390 Urinary tract infection, site not specified: Secondary | ICD-10-CM | POA: Insufficient documentation

## 2013-06-03 DIAGNOSIS — I779 Disorder of arteries and arterioles, unspecified: Secondary | ICD-10-CM

## 2013-06-03 DIAGNOSIS — R9430 Abnormal result of cardiovascular function study, unspecified: Secondary | ICD-10-CM

## 2013-06-03 DIAGNOSIS — R0602 Shortness of breath: Secondary | ICD-10-CM

## 2013-06-03 DIAGNOSIS — E119 Type 2 diabetes mellitus without complications: Secondary | ICD-10-CM

## 2013-06-03 DIAGNOSIS — I951 Orthostatic hypotension: Secondary | ICD-10-CM | POA: Insufficient documentation

## 2013-06-03 DIAGNOSIS — R0989 Other specified symptoms and signs involving the circulatory and respiratory systems: Secondary | ICD-10-CM

## 2013-06-03 DIAGNOSIS — I10 Essential (primary) hypertension: Secondary | ICD-10-CM

## 2013-06-03 DIAGNOSIS — I441 Atrioventricular block, second degree: Secondary | ICD-10-CM

## 2013-06-03 DIAGNOSIS — E875 Hyperkalemia: Secondary | ICD-10-CM

## 2013-06-03 NOTE — Assessment & Plan Note (Signed)
The patient has significant underlying renal disease. Most recently she had acute renal failure with a creatinine over 4. This improved with hydration and with stopping ACE inhibitor, and Bactrim. I am not able to say what the major offending tissue was. We will have to be sure she remains hydrated. This is important for her renal function and her orthostatic hypotension. However it is difficult with her shortness of breath.

## 2013-06-03 NOTE — Patient Instructions (Addendum)
**Note De-Identified  Obfuscation** Your physician recommends that you continue on your current medications as directed. Please refer to the Current Medication list given to you today.  Your physician recommends that you schedule a follow-up appointment in: [facility] in 4 weeks

## 2013-06-03 NOTE — Assessment & Plan Note (Signed)
Her diabetes is being treated. No change in therapy.

## 2013-06-03 NOTE — Assessment & Plan Note (Signed)
There is a history of orthostatic hypotension. Currently her diuretics cannot be pushed higher

## 2013-06-03 NOTE — Assessment & Plan Note (Signed)
There is a history of normal ejection fraction. Her most recent echo also showed an ejection fraction of 60%.

## 2013-06-03 NOTE — Assessment & Plan Note (Signed)
The patient's rhythm was watched very carefully in the hospital. She has definite Mobitz type I Wenckebach. She does not need a pacemaker at this point.

## 2013-06-03 NOTE — Assessment & Plan Note (Signed)
The patient had hyperkalemia when she presented to the hospital. This was treated and stabilized.

## 2013-06-03 NOTE — Assessment & Plan Note (Signed)
Blood pressure is controlled. No change in therapy. 

## 2013-06-03 NOTE — Assessment & Plan Note (Signed)
The assessment of her shortness of breath is very difficult. It is important that she is not to over diuresis for this symptom. On physical exam she does have scattered crackles that do not correlate with an abnormal chest x-ray. I'm hoping that she will get stronger with rehabilitation and have less shortness of breath.

## 2013-06-03 NOTE — Assessment & Plan Note (Signed)
Patient had a urinary tract infection in the hospital that was treated. This can be followed by her primary physician.  As part of today's evaluation I spent a very extensive amount of time reviewing her records and trying to understand how we can proceed with her multiple conflicting problems. I spent more than 25 minutes total. More than half of 25 minutes was spent with direct contact with the patient. Also I spoke at length with her daughter in person.

## 2013-06-03 NOTE — Progress Notes (Signed)
HPI  This complex patient is seen for post hospital cardiology followup. The patient has a history of Mobitz1, Wenckebach, AV block. It is my understanding there had been a problem with peripheral edema. Diuretics were used. However the patient presented to the hospital with acute renal failure and hyperkalemia. She was treated with IV fluids and Kayexalate. Creatinine was 4.3 on admission and improved to 1.5 at the time of discharge. Renal ultrasound had shown no significant abnormality. It is possible that some of her renal dysfunction may have been related to ACE inhibitor and Bactrim along with her diuresis.  There is a history of orthostatic hypotension. Therefore it will be important to keep the patient's volume adequate.  The patient also had Mobitz type I block. This was followed carefully in the hospital. Her overall heart rate remained stable. It was felt that she did not need a pacemaker for this problem.  Ultimately she was discharged to the rehabilitation center in [place]. She has been there for approximately one week. She is now here for post hospital visit. Her daughter tells me that she is progressing at rehabilitation. The patient tells me that she has marked exertional fatigue.  No Known Allergies  Current Outpatient Prescriptions  Medication Sig Dispense Refill  . amLODipine (NORVASC) 5 MG tablet Take 1 tablet (5 mg total) by mouth daily.      Marland Kitchen aspirin 81 MG tablet Take 81 mg by mouth at bedtime.       . brimonidine (ALPHAGAN) 0.2 % ophthalmic solution Place 1 drop into both eyes 2 (two) times daily.      . calcium-vitamin D (OSCAL WITH D) 500-200 MG-UNIT per tablet Take 1 tablet by mouth.      . Cholecalciferol (VITAMIN D-3) 1000 UNITS CAPS Take by mouth daily.      . furosemide (LASIX) 40 MG tablet Take 1 tablet (40 mg total) by mouth daily.  30 tablet    . gabapentin (NEURONTIN) 100 MG capsule Take 7 capsules (700 mg total) by mouth 2 (two) times daily.      . insulin  aspart (NOVOLOG) 100 UNIT/ML injection 0-9 Units, Subcutaneous, 3 times daily with meals CBG < 70: implement hypoglycemia protocol CBG 70 - 120: 0 units CBG 121 - 150: 1 unit CBG 151 - 200: 2 units CBG 201 - 250: 3 units CBG 251 - 300: 5 units CBG 301 - 350: 7 units CBG 351 - 400: 9 units CBG > 400: call MD and obtain STAT lab verification  1 vial  12  . insulin glargine (LANTUS) 100 UNIT/ML injection Inject 0.2 mL (20 Units total) into the skin at bedtime.  10 mL  12  . LORazepam (ATIVAN) 0.5 MG tablet Take 1 tablet (0.5 mg total) by mouth daily as needed. For anxiety  15 tablet  0  . MULTIPLE VITAMIN PO Take by mouth daily.      Marland Kitchen omeprazole (PRILOSEC) 20 MG capsule Take 20 mg by mouth every morning.       . sertraline (ZOLOFT) 100 MG tablet Take 100 mg by mouth every morning.       No current facility-administered medications for this visit.    History   Social History  . Marital Status: Widowed    Spouse Name: N/A    Number of Children: N/A  . Years of Education: N/A   Occupational History  . Not on file.   Social History Main Topics  . Smoking status: Never Smoker   .  Smokeless tobacco: Never Used  . Alcohol Use: No  . Drug Use: No  . Sexual Activity: Not on file   Other Topics Concern  . Not on file   Social History Narrative   Husband died recently mid 07/26/11.    Family History  Problem Relation Age of Onset  . Heart attack Father   . Heart attack Mother   . Diabetes Sister   . Hypertension Sister   . Peripheral vascular disease Sister   . Diabetes Brother   . Heart disease Brother   . Hypertension Brother     Past Medical History  Diagnosis Date  . Hypertension     moderate left ventricular hypertrophy  . Diabetes mellitus     h/o negative exercise stress test/normal LVF assessed June 2011 by echo EF 60-65%  . Hyperlipidemia   . Carotid artery disease     with less than 60% stenosis on the right in 2006  . History of seizure disorder      on depakote  . History of recurrent UTIs   . Chronic edema   . Depression   . Acid reflux disease   . History of medication noncompliance     this has been ongoing for quite sometime  . Dysrhythmia     Second-degree AVB Mobitz 1, 11/12; history of PAT  . Chronic kidney disease   . Stroke     .  Marland Kitchen Ejection fraction     .  Marland Kitchen Mitral regurgitation     Past Surgical History  Procedure Laterality Date  . Cesarean section  1973    Patient Active Problem List   Diagnosis Date Noted  . Ejection fraction   . Mitral regurgitation   . History of seizure disorder   . History of medication noncompliance   . Abnormal urinalysis 05/21/2013  . Hypoglycemia 05/21/2013  . Mobitz (type) I (Wenckebach's) atrioventricular block 05/20/2013  . Hyperkalemia 05/20/2013  . Acute renal failure 05/20/2013  . Atherosclerosis of native arteries of the extremities with intermittent claudication 05/08/2012  . HTN (hypertension) 09/15/2011  . HLD (hyperlipidemia) 09/15/2011  . DM (diabetes mellitus) 09/15/2011  . Carotid artery disease 09/15/2011    ROS   Patient denies fever, chills, headache, sweats, rash, change in vision, change in hearing, chest pain, nausea vomiting, urinary symptoms. All other systems are reviewed and are negative.  PHYSICAL EXAM   Patient is in a wheelchair. She is stable. She gives the appearance of becoming short of breath from talking only. I'm not sure that this is true dyspnea. There is no jugulovenous distention. Lungs reveal scattered rhonchi. Cardiac exam reveals S1 and S2. There no clicks or significant murmurs. The abdomen is soft. There is no significant peripheral edema. She is poor dentition. There no musculoskeletal deformities. There are no skin rashes.  Filed Vitals:   06/03/13 1425  BP: 146/68  Pulse: 72  Height: 5\' 3"  (1.6 m)  Weight: 196 lb (88.905 kg)     ASSESSMENT & PLAN

## 2013-06-03 NOTE — Assessment & Plan Note (Signed)
The patient has known carotid artery disease. Over time we will have to reassess the timing of her next Doppler study. I have report from November, 2012. She did have moderate disease at that time.

## 2013-06-21 ENCOUNTER — Inpatient Hospital Stay (HOSPITAL_COMMUNITY)
Admission: AD | Admit: 2013-06-21 | Discharge: 2013-06-26 | DRG: 244 | Disposition: A | Discharge status: Home / Self Care | Payer: Medicare Other | Source: Other Acute Inpatient Hospital | Attending: Cardiology | Admitting: Cardiology

## 2013-06-21 DIAGNOSIS — R0602 Shortness of breath: Secondary | ICD-10-CM

## 2013-06-21 DIAGNOSIS — Z950 Presence of cardiac pacemaker: Secondary | ICD-10-CM | POA: Diagnosis not present

## 2013-06-21 DIAGNOSIS — I059 Rheumatic mitral valve disease, unspecified: Secondary | ICD-10-CM | POA: Diagnosis present

## 2013-06-21 DIAGNOSIS — Z91199 Patient's noncompliance with other medical treatment and regimen due to unspecified reason: Secondary | ICD-10-CM

## 2013-06-21 DIAGNOSIS — I340 Nonrheumatic mitral (valve) insufficiency: Secondary | ICD-10-CM | POA: Diagnosis present

## 2013-06-21 DIAGNOSIS — I10 Essential (primary) hypertension: Secondary | ICD-10-CM

## 2013-06-21 DIAGNOSIS — Z7982 Long term (current) use of aspirin: Secondary | ICD-10-CM

## 2013-06-21 DIAGNOSIS — Z79899 Other long term (current) drug therapy: Secondary | ICD-10-CM

## 2013-06-21 DIAGNOSIS — I442 Atrioventricular block, complete: Secondary | ICD-10-CM

## 2013-06-21 DIAGNOSIS — Z9119 Patient's noncompliance with other medical treatment and regimen: Secondary | ICD-10-CM

## 2013-06-21 DIAGNOSIS — F3289 Other specified depressive episodes: Secondary | ICD-10-CM | POA: Diagnosis present

## 2013-06-21 DIAGNOSIS — K219 Gastro-esophageal reflux disease without esophagitis: Secondary | ICD-10-CM | POA: Diagnosis present

## 2013-06-21 DIAGNOSIS — E785 Hyperlipidemia, unspecified: Secondary | ICD-10-CM | POA: Diagnosis present

## 2013-06-21 DIAGNOSIS — I951 Orthostatic hypotension: Secondary | ICD-10-CM | POA: Diagnosis present

## 2013-06-21 DIAGNOSIS — R079 Chest pain, unspecified: Secondary | ICD-10-CM | POA: Diagnosis present

## 2013-06-21 DIAGNOSIS — R5383 Other fatigue: Secondary | ICD-10-CM

## 2013-06-21 DIAGNOSIS — Z8673 Personal history of transient ischemic attack (TIA), and cerebral infarction without residual deficits: Secondary | ICD-10-CM

## 2013-06-21 DIAGNOSIS — Z794 Long term (current) use of insulin: Secondary | ICD-10-CM

## 2013-06-21 DIAGNOSIS — E119 Type 2 diabetes mellitus without complications: Secondary | ICD-10-CM

## 2013-06-21 DIAGNOSIS — R5381 Other malaise: Secondary | ICD-10-CM

## 2013-06-21 DIAGNOSIS — I2510 Atherosclerotic heart disease of native coronary artery without angina pectoris: Secondary | ICD-10-CM | POA: Diagnosis present

## 2013-06-21 DIAGNOSIS — I441 Atrioventricular block, second degree: Secondary | ICD-10-CM

## 2013-06-21 DIAGNOSIS — N183 Chronic kidney disease, stage 3 unspecified: Secondary | ICD-10-CM | POA: Diagnosis present

## 2013-06-21 DIAGNOSIS — I129 Hypertensive chronic kidney disease with stage 1 through stage 4 chronic kidney disease, or unspecified chronic kidney disease: Secondary | ICD-10-CM | POA: Diagnosis present

## 2013-06-21 DIAGNOSIS — G40909 Epilepsy, unspecified, not intractable, without status epilepticus: Secondary | ICD-10-CM | POA: Diagnosis present

## 2013-06-21 DIAGNOSIS — F329 Major depressive disorder, single episode, unspecified: Secondary | ICD-10-CM | POA: Diagnosis present

## 2013-06-21 HISTORY — DX: Other supraventricular tachycardia: I47.19

## 2013-06-21 HISTORY — DX: Atrioventricular block, complete: I44.2

## 2013-06-21 HISTORY — DX: Supraventricular tachycardia: I47.1

## 2013-06-21 HISTORY — DX: Nonrheumatic mitral (valve) insufficiency: I34.0

## 2013-06-21 HISTORY — DX: Atherosclerotic heart disease of native coronary artery without angina pectoris: I25.10

## 2013-06-21 LAB — CBC
MCV: 83 fL (ref 78.0–100.0)
Platelets: 286 10*3/uL (ref 150–400)
RBC: 4.52 MIL/uL (ref 3.87–5.11)
WBC: 6.2 10*3/uL (ref 4.0–10.5)

## 2013-06-21 LAB — GLUCOSE, CAPILLARY: Glucose-Capillary: 215 mg/dL — ABNORMAL HIGH (ref 70–99)

## 2013-06-21 LAB — CREATININE, SERUM
Creatinine, Ser: 1.84 mg/dL — ABNORMAL HIGH (ref 0.50–1.10)
GFR calc Af Amer: 29 mL/min — ABNORMAL LOW (ref >90)

## 2013-06-21 MED ORDER — PANTOPRAZOLE SODIUM 40 MG PO TBEC
40.0000 mg | DELAYED_RELEASE_TABLET | Freq: Every day | ORAL | Status: DC
  Administered 2013-06-22 – 2013-06-26 (×5): 40 mg via ORAL
  Filled 2013-06-21 (×5): qty 1

## 2013-06-21 MED ORDER — BRIMONIDINE TARTRATE 0.2 % OP SOLN
1.0000 [drp] | Freq: Two times a day (BID) | OPHTHALMIC | Status: DC
  Administered 2013-06-21 – 2013-06-26 (×10): 1 [drp] via OPHTHALMIC
  Filled 2013-06-21: qty 5

## 2013-06-21 MED ORDER — VITAMIN D-3 25 MCG (1000 UT) PO CAPS: ORAL_CAPSULE | Freq: Every day | ORAL | Status: DC

## 2013-06-21 MED ORDER — CALCIUM CARBONATE-VITAMIN D 500-200 MG-UNIT PO TABS
1.0000 | ORAL_TABLET | Freq: Every day | ORAL | Status: DC
  Administered 2013-06-22 – 2013-06-26 (×5): 1 via ORAL
  Filled 2013-06-21 (×6): qty 1

## 2013-06-21 MED ORDER — ACETAMINOPHEN 325 MG PO TABS
650.0000 mg | ORAL_TABLET | ORAL | Status: DC | PRN
  Administered 2013-06-21 – 2013-06-24 (×4): 650 mg via ORAL
  Filled 2013-06-21 (×4): qty 2

## 2013-06-21 MED ORDER — INSULIN GLARGINE 100 UNIT/ML ~~LOC~~ SOLN
20.0000 [IU] | Freq: Two times a day (BID) | SUBCUTANEOUS | Status: DC
  Administered 2013-06-21 – 2013-06-23 (×4): 20 [IU] via SUBCUTANEOUS
  Administered 2013-06-23: 10 [IU] via SUBCUTANEOUS
  Administered 2013-06-24 – 2013-06-26 (×4): 20 [IU] via SUBCUTANEOUS
  Filled 2013-06-21 (×11): qty 0.2

## 2013-06-21 MED ORDER — LORAZEPAM 0.5 MG PO TABS
0.5000 mg | ORAL_TABLET | Freq: Every day | ORAL | Status: DC | PRN
  Administered 2013-06-21 – 2013-06-25 (×4): 0.5 mg via ORAL
  Filled 2013-06-21 (×4): qty 1

## 2013-06-21 MED ORDER — VITAMIN D3 25 MCG (1000 UNIT) PO TABS
1000.0000 [IU] | ORAL_TABLET | Freq: Every day | ORAL | Status: DC
  Administered 2013-06-22 – 2013-06-26 (×5): 1000 [IU] via ORAL
  Filled 2013-06-21 (×6): qty 1

## 2013-06-21 MED ORDER — SODIUM CHLORIDE 0.9 % IV SOLN: INTRAVENOUS | Status: DC

## 2013-06-21 MED ORDER — GABAPENTIN 400 MG PO CAPS
700.0000 mg | ORAL_CAPSULE | Freq: Two times a day (BID) | ORAL | Status: DC
  Administered 2013-06-21 – 2013-06-26 (×10): 700 mg via ORAL
  Filled 2013-06-21 (×11): qty 1

## 2013-06-21 MED ORDER — ASPIRIN EC 81 MG PO TBEC
81.0000 mg | DELAYED_RELEASE_TABLET | Freq: Every day | ORAL | Status: DC
  Administered 2013-06-21 – 2013-06-23 (×2): 81 mg via ORAL
  Filled 2013-06-21 (×5): qty 1

## 2013-06-21 MED ORDER — ASPIRIN 81 MG PO TABS: 81.0000 mg | ORAL_TABLET | Freq: Every day | ORAL | Status: DC

## 2013-06-21 MED ORDER — SODIUM CHLORIDE 0.9 % IJ SOLN
3.0000 mL | Freq: Two times a day (BID) | INTRAMUSCULAR | Status: DC
Start: 2013-06-21 — End: 2013-06-26
  Administered 2013-06-21 – 2013-06-26 (×8): 3 mL via INTRAVENOUS

## 2013-06-21 MED ORDER — SERTRALINE HCL 100 MG PO TABS
100.0000 mg | ORAL_TABLET | Freq: Every morning | ORAL | Status: DC
  Administered 2013-06-22 – 2013-06-26 (×5): 100 mg via ORAL
  Filled 2013-06-21 (×5): qty 1

## 2013-06-21 MED ORDER — ONDANSETRON HCL 4 MG/2ML IJ SOLN: 4.0000 mg | Freq: Four times a day (QID) | INTRAMUSCULAR | Status: DC | PRN

## 2013-06-21 MED ORDER — FUROSEMIDE 40 MG PO TABS
40.0000 mg | ORAL_TABLET | Freq: Every day | ORAL | Status: DC
  Administered 2013-06-22 (×2): 40 mg via ORAL
  Filled 2013-06-21 (×3): qty 1

## 2013-06-21 MED ORDER — SODIUM CHLORIDE 0.9 % IJ SOLN
3.0000 mL | INTRAMUSCULAR | Status: DC | PRN
  Administered 2013-06-23: 3 mL via INTRAVENOUS

## 2013-06-21 MED ORDER — SODIUM CHLORIDE 0.9 % IV SOLN: 250.0000 mL | INTRAVENOUS | Status: DC | PRN

## 2013-06-21 MED ORDER — HEPARIN SODIUM (PORCINE) 5000 UNIT/ML IJ SOLN
5000.0000 [IU] | Freq: Three times a day (TID) | INTRAMUSCULAR | Status: DC
  Administered 2013-06-21 – 2013-06-25 (×10): 5000 [IU] via SUBCUTANEOUS
  Filled 2013-06-21 (×14): qty 1

## 2013-06-21 MED ORDER — INSULIN ASPART 100 UNIT/ML ~~LOC~~ SOLN
0.0000 [IU] | Freq: Three times a day (TID) | SUBCUTANEOUS | Status: DC
  Administered 2013-06-21: 5 [IU] via SUBCUTANEOUS
  Administered 2013-06-22 (×2): 3 [IU] via SUBCUTANEOUS
  Administered 2013-06-23 (×2): 5 [IU] via SUBCUTANEOUS
  Administered 2013-06-23 – 2013-06-24 (×2): 3 [IU] via SUBCUTANEOUS
  Administered 2013-06-24 – 2013-06-25 (×3): 5 [IU] via SUBCUTANEOUS
  Administered 2013-06-25: 3 [IU] via SUBCUTANEOUS
  Administered 2013-06-26: 5 [IU] via SUBCUTANEOUS
  Administered 2013-06-26: 8 [IU] via SUBCUTANEOUS

## 2013-06-21 MED ORDER — AMLODIPINE BESYLATE 5 MG PO TABS
5.0000 mg | ORAL_TABLET | Freq: Every day | ORAL | Status: DC
  Administered 2013-06-22 – 2013-06-26 (×5): 5 mg via ORAL
  Filled 2013-06-21 (×6): qty 1

## 2013-06-21 MED ORDER — NITROGLYCERIN 0.4 MG SL SUBL: 0.4000 mg | SUBLINGUAL_TABLET | SUBLINGUAL | Status: DC | PRN

## 2013-06-21 NOTE — H&P (Signed)
Patient ID: Becky Collins MRN: [id number], DOB/AGE: 77-Sep-1933   Admit date: 06/21/2013  Primary Physician: Selinda Flavin, MD Primary Cardiologist: Lovena Neighbours, MD   Pt. Profile:  79 y/o female transferred from [facility] 2/2 progressive fatigue and hypoxia.  Problem List  Past Medical History  Diagnosis Date  . Hypertension     moderate left ventricular hypertrophy  . Diabetes mellitus     h/o negative exercise stress test/normal LVF assessed June 2011 by echo EF 60-65%  . Hyperlipidemia   . Carotid artery disease     with less than 60% stenosis on the right in 2006  . History of seizure disorder     on depakote  . History of recurrent UTIs   . Chronic edema   . Depression   . Acid reflux disease   . History of medication noncompliance     this has been ongoing for quite sometime  . Dysrhythmia     Second-degree AVB Mobitz 1, 11/12; history of PAT  . Chronic kidney disease   . Stroke     .  Marland Kitchen Ejection fraction     .  Marland Kitchen Mitral regurgitation   . CKD (chronic kidney disease), stage III   . Orthostatic hypotension   . Shortness of breath     Past Surgical History  Procedure Laterality Date  . Cesarean section  1973     Allergies  No Known Allergies  HPI  79 y/o female with the above problem list.  Please see Dr. Henrietta Hoover note from [facility] (listed under Chart Review/Media) for complete details of current admission.  Briefly, she is a 79 y/o female with a h/o progressive fatigue and Mobitz I heart block.  She was recently hospitalized @ [facility] for these issues and it was not felt that she would require a pacemaker.  Echo showed normal LV function.  She was subsequently discharged to rehab and was seen in the office by Dr. Myrtis Ser on 8/25, at which time she was doing well.  On 9/10, she was readmitted to [facility] 2/2 recurrent fatigue, wkns, and edema.  At [facility], CXR showed no CHF and V:Q scan was neg for PE.  She was noted to become tachycardic and  hypoxic with ambulation and cardiology was consulted.  She was seen by Dr. Myrtis Ser today and continued to c/o fatigue.  Given her constellation of Ss, decision was made to tx her to [facility] for further evaluation.  She currently offers no complaints.  Home Medications  Prior to Admission medications   Medication Sig Start Date End Date Taking? Authorizing Provider  amLODipine (NORVASC) 5 MG tablet Take 1 tablet (5 mg total) by mouth daily. 05/27/13   Shanker Levora Dredge, MD  aspirin 81 MG tablet Take 81 mg by mouth at bedtime.     Historical Provider, MD  brimonidine (ALPHAGAN) 0.2 % ophthalmic solution Place 1 drop into both eyes 2 (two) times daily.    Historical Provider, MD  calcium-vitamin D (OSCAL WITH D) 500-200 MG-UNIT per tablet Take 1 tablet by mouth.    Historical Provider, MD  Cholecalciferol (VITAMIN D-3) 1000 UNITS CAPS Take by mouth daily.    Historical Provider, MD  furosemide (LASIX) 40 MG tablet Take 1 tablet (40 mg total) by mouth daily. 05/27/13   Shanker Levora Dredge, MD  gabapentin (NEURONTIN) 100 MG capsule Take 7 capsules (700 mg total) by mouth 2 (two) times daily. 05/27/13   Shanker Levora Dredge, MD  insulin aspart (NOVOLOG)  100 UNIT/ML injection 0-9 Units, Subcutaneous, 3 times daily with meals CBG < 70: implement hypoglycemia protocol CBG 70 - 120: 0 units CBG 121 - 150: 1 unit CBG 151 - 200: 2 units CBG 201 - 250: 3 units CBG 251 - 300: 5 units CBG 301 - 350: 7 units CBG 351 - 400: 9 units CBG > 400: call MD and obtain STAT lab verification 05/27/13   Maretta Bees, MD  insulin glargine (LANTUS) 100 UNIT/ML injection Inject 0.2 mL (20 Units total) into the skin at bedtime. 05/27/13   Shanker Levora Dredge, MD  LORazepam (ATIVAN) 0.5 MG tablet Take 1 tablet (0.5 mg total) by mouth daily as needed. For anxiety 05/27/13   Maretta Bees, MD  MULTIPLE VITAMIN PO Take by mouth daily.    Historical Provider, MD  omeprazole (PRILOSEC) 20 MG capsule Take 20 mg by mouth every morning.      Historical Provider, MD  sertraline (ZOLOFT) 100 MG tablet Take 100 mg by mouth every morning.    Historical Provider, MD   [facility] Meds  Insulin Sertraline Asa Amlodipine protonix Lasix neurontin  Family History  Family History  Problem Relation Age of Onset  . Heart attack Father   . Heart attack Mother   . Diabetes Sister   . Hypertension Sister   . Peripheral vascular disease Sister   . Diabetes Brother   . Heart disease Brother   . Hypertension Brother     Social History  History   Social History  . Marital Status: Widowed    Spouse Name: N/A    Number of Children: N/A  . Years of Education: N/A   Occupational History  . Not on file.   Social History Main Topics  . Smoking status: Never Smoker   . Smokeless tobacco: Never Used  . Alcohol Use: No  . Drug Use: No  . Sexual Activity: Not on file   Other Topics Concern  . Not on file   Social History Narrative   Husband died recently mid 08-02-11.     Review of Systems Complaints as outlined above in HPI.  Otw all other systems reviewed and are otherwise negative except as noted above.  Physical Exam  Blood pressure 136/45, pulse 84, temperature 98.2 F (36.8 C), temperature source Oral, SpO2 100.00%.  General: Pleasant, NAD, fatigued appearing. Psych: Normal affect. Neuro: Alert and oriented X 3. Moves all extremities spontaneously. HEENT: Normal  Neck: Supple without bruits or JVD. Lungs:  Resp regular and unlabored, scattered rhonchi, basilar rales. Heart: RRR no s3, s4, or murmurs. Abdomen: Soft, non-tender, non-distended, BS + x 4.  Extremities: No clubbing, cyanosis or edema. DP/PT/Radials 2+ and equal bilaterally.  Labs  BUN 24 Creat 1.68 K 4.0 Na 133 Hgb 9.8 Trop 0.04-0.04-0.03.  Radiology/Studies  Dg Chest Port 1 View  cxr - no active lung dzs, stable cardiomegaly.  V:Q low prob.  ECG  Sinus, mobitz I HB. No acute findings.  ASSESSMENT AND PLAN  1. Fatigue  and Hypoxia:  Noted to drop O2 sat to 79% with activity @ [facility].  Etiology unclear.  V:Q low prob.  EF previously nl.  Tx to [facility] for R & L heart cath on Monday.  Will need pulmonary consultation called in AM.  If testing unrevealing, will give consideration to CPX testing while in house.  Cont current meds.  2.  Chest pain:  She has had intermittent/vague chest pain @ [facility].  Troponin's nl.  Cath Monday  as above in setting of other Ss.  3.  Mobitz I, second degree HB: stable.  Resting HR currently in 80's.  This has not been felt to be contributing to Ss.  4.  HTN:  Stable.  Signed, Nicolasa Ducking, NP 06/21/2013, 3:05 PM Patient seen and examined. I agree with the assessment and plan as detailed above. See also my additional thoughts below.   I have written a very extensive note that he is in this record underneath media. The computer will not allow this to be copy and paste into the H&P. The patient is here for follow valuation of significant shortness of breath with hypoxia on exercise. We will continue to watch her heart rate also.  Willa Rough, MD, [facility] 06/21/2013 4:11 PM

## 2013-06-21 NOTE — Progress Notes (Signed)
New patient arrived to 3W-21 via [facility]. Patient with no orders at admission.  However, card-master paged and this RN is awaiting call from Attending physician.  Patient has been placed on the monitor.  Bed alarm is on.  Patient has been oriented to the unit with call bell and telephone within reach. Awaiting orders from physician.  Patient is alert and oriented.  Only complaint is shortness of breath with exertion.  Will monitor patient.

## 2013-06-21 NOTE — Progress Notes (Signed)
On my assessment notice that I wasn't"t able to hear much air movement and that pt seem to be air hunger using abdomen muscle called respiratory to assess O2 stat were 100% on 3L O2 nasal cannula. Family member informed that she had walked her to the bathroom I ask the family not to walk to bathroom but to use the bedside commode and to limit conversation to minimum. Will continue to monitor. Ilean Skill LPN

## 2013-06-22 ENCOUNTER — Inpatient Hospital Stay (HOSPITAL_COMMUNITY): Payer: Medicare Other

## 2013-06-22 DIAGNOSIS — R079 Chest pain, unspecified: Secondary | ICD-10-CM | POA: Diagnosis present

## 2013-06-22 DIAGNOSIS — R5383 Other fatigue: Secondary | ICD-10-CM | POA: Diagnosis present

## 2013-06-22 LAB — GLUCOSE, CAPILLARY
Glucose-Capillary: 168 mg/dL — ABNORMAL HIGH (ref 70–99)
Glucose-Capillary: 167 mg/dL — ABNORMAL HIGH (ref 70–99)
Glucose-Capillary: 135 mg/dL — ABNORMAL HIGH (ref 70–99)

## 2013-06-22 LAB — BASIC METABOLIC PANEL
Calcium: 9.1 mg/dL (ref 8.4–10.5)
GFR calc Af Amer: 33 mL/min — ABNORMAL LOW (ref >90)
GFR calc non Af Amer: 28 mL/min — ABNORMAL LOW (ref >90)
Sodium: 132 mEq/L — ABNORMAL LOW (ref 135–145)

## 2013-06-22 MED ORDER — LACTULOSE 10 GM/15ML PO SOLN
20.0000 g | Freq: Two times a day (BID) | ORAL | Status: DC
  Administered 2013-06-22 – 2013-06-26 (×7): 20 g via ORAL
  Filled 2013-06-22 (×9): qty 30

## 2013-06-22 MED ORDER — SENNOSIDES-DOCUSATE SODIUM 8.6-50 MG PO TABS
1.0000 | ORAL_TABLET | Freq: Two times a day (BID) | ORAL | Status: DC | PRN
  Filled 2013-06-22: qty 1

## 2013-06-22 MED ORDER — FUROSEMIDE 10 MG/ML IJ SOLN: 40.0000 mg | Freq: Once | INTRAMUSCULAR | Status: AC

## 2013-06-22 MED ORDER — HYDROMORPHONE HCL PF 1 MG/ML IJ SOLN
0.5000 mg | INTRAMUSCULAR | Status: DC | PRN
  Administered 2013-06-22: 0.5 mg via INTRAVENOUS
  Filled 2013-06-22: qty 1

## 2013-06-22 NOTE — Progress Notes (Signed)
Pt having some runs of first to third degree heart block ,vss alert and oriented , 02 at 4 liters ,MD made aware ( on call for DR. Olga Millers)

## 2013-06-22 NOTE — Progress Notes (Signed)
Patient ID: Becky Collins, female   DOB: [birth year], 79 y.o.   MRN: [id number]   SUBJECTIVE: I transferred this patient for [facility] yesterday. There is a very extensive consult note that has been scanned into the media section of this record. Unfortunately it cannot be copy and paste that to be used as an H&P. A new H&P was done.  Assessment this patient is very difficult. Lying in bed she gives the impression that she is too fatigued to talk. This presentation is difficult to assess. At the same time we have seen that she dropped her O2 sat to 79% when walking in the room at [facility]. A nursing note from last night relates that the patient appeared to have air hunger after she was up in the room. Her O2 sat on oxygen was 100% at that time.   Filed Vitals:   06/21/13 1427 06/21/13 1958 06/22/13 0300  BP: 136/45 148/39 145/45  Pulse: 84 45 46  Temp: 98.2 F (36.8 C) 98.5 F (36.9 C) 98.5 F (36.9 C)  TempSrc: Oral    Resp:  18 18  Weight:   201 lb 12.7 oz (91.532 kg)  SpO2: 100% 100% 100%    Intake/Output Summary (Last 24 hours) at 06/22/13 1012 Last data filed at 06/22/13 0100  Gross per 24 hour  Intake      0 ml  Output    300 ml  Net   -300 ml    LABS: Basic Metabolic Panel:  Recent Labs  78/29/56 1635 06/22/13 0530  NA  --  132*  K  --  4.0  CL  --  97  CO2  --  25  GLUCOSE  --  97  BUN  --  22  CREATININE 1.84* 1.66*  CALCIUM  --  9.1   Liver Function Tests: No results found for this basename: AST, ALT, ALKPHOS, BILITOT, PROT, ALBUMIN,  in the last 72 hours No results found for this basename: LIPASE, AMYLASE,  in the last 72 hours CBC:  Recent Labs  06/21/13 1635  WBC 6.2  HGB 11.7*  HCT 37.5  MCV 83.0  PLT 286   Cardiac Enzymes: No results found for this basename: CKTOTAL, CKMB, CKMBINDEX, TROPONINI,  in the last 72 hours BNP: No components found with this basename: POCBNP,  D-Dimer: No results found for this basename: DDIMER,  in  the last 72 hours Hemoglobin A1C: No results found for this basename: HGBA1C,  in the last 72 hours Fasting Lipid Panel: No results found for this basename: CHOL, HDL, LDLCALC, TRIG, CHOLHDL, LDLDIRECT,  in the last 72 hours Thyroid Function Tests: No results found for this basename: TSH, T4TOTAL, FREET3, T3FREE, THYROIDAB,  in the last 72 hours  RADIOLOGY: Dg Chest Port 1 View  05/27/2013   *RADIOLOGY REPORT*  Clinical Data: Shortness of breath.  PORTABLE CHEST - 1 VIEW  Comparison: Plain film of the chest 05/20/2013 and PA and lateral chest 05/08/2013.  Findings: Subsegmental atelectasis is seen in the left lung base. The lungs are otherwise clear.  Heart size is upper normal.  No pneumothorax or pleural fluid.  IMPRESSION: No acute disease.   Original Report Authenticated By: Holley Dexter, M.D.    PHYSICAL EXAM  patient lies in bed appearing to have marked fatigue. This occurs with family members in the room. There is no jugulovenous distention. Lungs reveal a few scattered rhonchi. Cardiac exam reveals S1 and S2. The abdomen is soft. There is no significant  peripheral edema.   TELEMETRY:  The patient's rhythm continues to show Mobitz type I, second degree AV block. She does have persistent 2-1 block of this type at times. The monitor measures her heart rate at 72 but it is double counting her P waves. Her rate is actually 36.   ASSESSMENT AND PLAN:   At this time it is still not clear to me what is causing the patient's major symptoms. We will proceed with complete evaluation.    Mobitz (type) I (Wenckebach's) atrioventricular block     It has been felt that her 2-1 block his Mobitz type I. We know that her heart rate increases when she is up and around. However her resting heart rate appears to be approximately 36. I will ask electrophysiology to assess her again. In her case I am wondering if this low heart rate really is causing significant symptoms.    Ejection fraction       Ejection fraction in August, 2014 was normal. There were no focal wall motion abnormalities.  Right ventricular function   By echo in August, 201 for right ventricular function is reported as normal. However right heart pressures could not be estimated.    Mitral regurgitation       There is mitral regurgitation is felt to be moderate. I am not convinced that this is causing major problems.    CKD (chronic kidney disease), stage III      We're trying to be very careful concerning her renal function. This is being watched carefully.    Shortness of breath     Etiology of her exertional shortness of breath remains unclear. She was ambulated at [facility] and dropped her O2 sat to 79%. We have no proof of pulmonary emboli. Her recent echo did not give [place] information about her right heart pressures. Chest x-ray has not shown marked CHF. I will repeat chest x-ray again today. I will also give her an extra dose of Lasix to see if this helps. When she had some shortness of breath last night she says that she was given something by the nurse that helped her feel better. However there is no documentation that she received any type of fluid medication or breathing medication. We will proceed with right and left heart catheterization. We will even consider an in house cardiopulmonary exercise test if this appears to be appropriate. We are asking for pulmonary consultation.    Chest pain      Patient has had Some vague chest pain. Catheterization will give [place] more information about this issue.    Fatigue     I cannot explain the patient fatigue that she seems to have just from talking lying in bed. Her thyroid functions are normal.  Willa Rough 06/22/2013 10:12 AM

## 2013-06-23 DIAGNOSIS — R0602 Shortness of breath: Secondary | ICD-10-CM

## 2013-06-23 LAB — BASIC METABOLIC PANEL
BUN: 20 mg/dL (ref 6–23)
CO2: 21 mEq/L (ref 19–32)
Calcium: 9.8 mg/dL (ref 8.4–10.5)
Glucose, Bld: 239 mg/dL — ABNORMAL HIGH (ref 70–99)
Potassium: 4.3 mEq/L (ref 3.5–5.1)
Sodium: 132 mEq/L — ABNORMAL LOW (ref 135–145)

## 2013-06-23 LAB — GLUCOSE, CAPILLARY
Glucose-Capillary: 172 mg/dL — ABNORMAL HIGH (ref 70–99)
Glucose-Capillary: 211 mg/dL — ABNORMAL HIGH (ref 70–99)
Glucose-Capillary: 207 mg/dL — ABNORMAL HIGH (ref 70–99)

## 2013-06-23 LAB — CBC
HCT: 35.1 % — ABNORMAL LOW (ref 36.0–46.0)
Hemoglobin: 10.8 g/dL — ABNORMAL LOW (ref 12.0–15.0)
MCH: 25.4 pg — ABNORMAL LOW (ref 26.0–34.0)
MCHC: 30.8 g/dL (ref 30.0–36.0)
MCV: 82.4 fL (ref 78.0–100.0)
Platelets: 255 10*3/uL (ref 150–400)
RBC: 4.26 MIL/uL (ref 3.87–5.11)
RDW: 14.2 % (ref 11.5–15.5)
WBC: 5.7 10*3/uL (ref 4.0–10.5)

## 2013-06-23 LAB — PROTIME-INR: INR: 1.03 (ref 0.00–1.49)

## 2013-06-23 MED ORDER — SODIUM CHLORIDE 0.9 % IJ SOLN: 3.0000 mL | INTRAMUSCULAR | Status: DC | PRN

## 2013-06-23 MED ORDER — SODIUM CHLORIDE 0.9 % IV SOLN
INTRAVENOUS | Status: DC
  Administered 2013-06-24: 06:00:00 via INTRAVENOUS

## 2013-06-23 MED ORDER — SODIUM CHLORIDE 0.9 % IJ SOLN: 3.0000 mL | Freq: Two times a day (BID) | INTRAMUSCULAR | Status: DC

## 2013-06-23 MED ORDER — ASPIRIN EC 81 MG PO TBEC
81.0000 mg | DELAYED_RELEASE_TABLET | Freq: Every day | ORAL | Status: DC
  Administered 2013-06-25: 81 mg via ORAL
  Filled 2013-06-23 (×2): qty 1

## 2013-06-23 MED ORDER — ASPIRIN 81 MG PO CHEW
324.0000 mg | CHEWABLE_TABLET | ORAL | Status: AC
  Administered 2013-06-24: 324 mg via ORAL
  Filled 2013-06-23: qty 4

## 2013-06-23 MED ORDER — ASPIRIN EC 81 MG PO TBEC
81.0000 mg | DELAYED_RELEASE_TABLET | Freq: Every day | ORAL | Status: AC
  Administered 2013-06-23: 81 mg via ORAL
  Filled 2013-06-23: qty 1

## 2013-06-23 MED ORDER — SODIUM CHLORIDE 0.9 % IV SOLN: 250.0000 mL | INTRAVENOUS | Status: DC | PRN

## 2013-06-23 NOTE — Consult Note (Signed)
Dictation #: [id number]

## 2013-06-23 NOTE — Consult Note (Signed)
NAMEMEMORI, SAMMON                  ACCOUNT NO.:  [id number]  MEDICAL RECORD NO.:  [id number]  LOCATION:  3W21C                        FACILITY:  [facility]  PHYSICIAN:  Barbaraann Share, MD,FCCPDATE OF BIRTH:  Feb 03[birth year] 1934  DATE OF CONSULTATION:  06/23/2013 DATE OF DISCHARGE:                                CONSULTATION   REFERRING PHYSICIAN:  Armanda Magic, M.D.  HISTORY OF PRESENT ILLNESS:  The patient is a 79 year old female who I have been asked to see for dyspnea.  She has a history of progressive fatigue and Mobitz type 1 heart block, and was admitted to [facility] on the 10th of this month with worsening fatigue, and edema. She had a chest x-ray that did not show any evidence of congestive heart failure, and had an echo earlier that was unremarkable.  She underwent a ventilation perfusion scanning that was negative for a pulmonary embolus.  She was noted to have significant tachycardia and oxygen desaturation as low as 79% with ambulation.  She has been transferred to [facility] for further evaluation.  She is scheduled for a left and right heart catheterization in the morning.  Her chest x-ray here is unremarkable except for cardiomegaly and mildly decreased depth of inspiration.  The patient tells me that she has had progressive dyspnea on exertion over the last one month.  She states that 6 months ago, she was able to do anything that she would wish to do.  Prior to being admitted, she had a with less than 1 block dyspnea on exertion, had moderate pace on flat ground, bring groceries in from the car or doing light housework.  She has had no cough, congestion, or mucus production. She denies pleuritic chest pain, but has had issues with lower extremity edema.  She has had no history of asthma and is never smoked.  She denies any significant neuromuscular weakness.  PAST MEDICAL HISTORY: 1. Significant for hypertension with left ventricular hypertrophy. 2.  Diabetes. 3. Dyslipidemia. 4. History of coronary disease. 5. History of seizure disorder. 6. History of chronic edema. 7. History of depression. 8. History of GERD. 9. History of chronic renal insufficiency. 10.History of CVA.  ALLERGIES:  The patient has no known drug allergies.  SOCIAL HISTORY:  She is widowed.  She never smoked or used recreational drugs.  FAMILY HISTORY:  Remarkable for myocardial infarction, diabetes, hypertension, peripheral vascular disease.  A complete review of systems was unremarkable except for that listed in the history of present illness.  PHYSICAL EXAMINATION:  GENERAL:  She is a weak-appearing female in no acute distress. VITAL SIGNS:  Blood pressure is 153/49, pulse 77, respiratory rate is 18, she is afebrile.  O2 saturation on 3 L is 100%. HEENT:  Pupils equal, round, and reactive to light and accommodation. Extraocular muscles are intact.  Nares are patent.  No discharge. Oropharynx is clear. NECK:  Supple without JVD or lymphadenopathy.  There is no palpable thyromegaly. CHEST:  Basilar crackles, but no wheezing. CARDIAC:  Regular rate and rhythm. ABDOMEN:  Soft, nontender, nondistended with active bowel sounds. GENITALIA, RECTAL, AND BREASTS:  Exam was not done and not indicated. EXTREMITIES:  Lower extremities  show trace edema with some varicosities. No calf tenderness, pulses are intact distally, but diminished. NEUROLOGIC:  She is alert and oriented, and moves all 4 extremities without obvious deficits.  IMPRESSION:  Progressive dyspnea on exertion over the last one month, of unknown origin.  By history and exam, there is no obvious pulmonary etiology for her symptoms, but she will need to have full pulmonary function studies.  I agree with a left and right heart catheterization to exclude progressive coronary disease, as well as increased left atrial pressure related to diastolic dysfunction.  It will also help exclude pulmonary  arterial hypertension.  If this fails to give [place] an etiology for her shortness of breath, could consider a cardiopulmonary exercise test and possibly a transcranial Doppler bubble study to exclude shunting.  SUGGESTIONS: 1. Wean oxygen aggressively. 2. We will schedule pulmonary function studies. 3. Agree with the right and left heart catheterization. 4. Can consider a cardiopulmonary exercise test or transcranial     Doppler bubble study to rule out shunt if no other etiologies     found for her symptoms.     Barbaraann Share, MD,FCCP     [facility]/MED[facility]  D:  06/23/2013  T:  06/23/2013  Job:  [id number]

## 2013-06-23 NOTE — Progress Notes (Signed)
Patient lying in bed with hands over lower abdomen complaining pain 10/10 .Patient states pain is sharp and radiates to back .Call placed to Dr. Jon Billings.Dr.Sivak to come assess patient.

## 2013-06-23 NOTE — Progress Notes (Addendum)
SUBJECTIVE:  SOB better.  Complains of lower abdominal pain  OBJECTIVE:   Vitals:   Filed Vitals:   06/22/13 1955 06/22/13 2030 06/23/13 0513 06/23/13 0626  BP: 150/57 161/48 139/34   Pulse: 58 55 98   Temp: 98.2 F (36.8 C) 97.9 F (36.6 C) 98.2 F (36.8 C)   TempSrc: Oral     Resp: 24 20 17    Weight:    91.717 kg (202 lb 3.2 oz)  SpO2: 100% 100% 100%    I&O's:   Intake/Output Summary (Last 24 hours) at 06/23/13 0837 Last data filed at 06/23/13 0500  Gross per 24 hour  Intake    240 ml  Output    700 ml  Net   -460 ml   TELEMETRY: Reviewed telemetry pt in NSR with type I second degree AV block and transient CHB     PHYSICAL EXAM General: Well developed, well nourished, in no acute distress Head: Eyes PERRLA, No xanthomas.   Normal cephalic and atramatic  Lungs:   Clear bilaterally to auscultation and percussion. Heart:   HRRR S1 S2 Pulses are 2+ & equal. Abdomen: Bowel sounds are positive, abdomen soft and non-tender without masses  Extremities:   No clubbing, cyanosis or edema.  DP +1 Neuro: Alert and oriented X 3. Psych:  Good affect, responds appropriately   LABS: Basic Metabolic Panel:  Recent Labs  16/10/96 1635 06/22/13 0530  NA  --  132*  K  --  4.0  CL  --  97  CO2  --  25  GLUCOSE  --  97  BUN  --  22  CREATININE 1.84* 1.66*  CALCIUM  --  9.1   Liver Function Tests: No results found for this basename: AST, ALT, ALKPHOS, BILITOT, PROT, ALBUMIN,  in the last 72 hours No results found for this basename: LIPASE, AMYLASE,  in the last 72 hours CBC:  Recent Labs  06/21/13 1635  WBC 6.2  HGB 11.7*  HCT 37.5  MCV 83.0  PLT 286   Cardiac Enzymes: No results found for this basename: CKTOTAL, CKMB, CKMBINDEX, TROPONINI,  in the last 72 hours BNP: No components found with this basename: POCBNP,  D-Dimer: No results found for this basename: DDIMER,  in the last 72 hours Hemoglobin A1C: No results found for this basename: HGBA1C,  in the last  72 hours Fasting Lipid Panel: No results found for this basename: CHOL, HDL, LDLCALC, TRIG, CHOLHDL, LDLDIRECT,  in the last 72 hours Thyroid Function Tests: No results found for this basename: TSH, T4TOTAL, FREET3, T3FREE, THYROIDAB,  in the last 72 hours Anemia Panel: No results found for this basename: VITAMINB12, FOLATE, FERRITIN, TIBC, IRON, RETICCTPCT,  in the last 72 hours Coag Panel:   No results found for this basename: INR, PROTIME    RADIOLOGY: Dg Chest 2 View  06/22/2013   CLINICAL DATA:  79 year old female shortness of Breath.  EXAM: CHEST  2 VIEW  COMPARISON:  06/19/2013 and earlier.  FINDINGS: Upright AP and lateral views. Lower lung volumes. Stable cardiomegaly and mediastinal contours. Visualized tracheal air column is within normal limits. No pneumothorax, pulmonary edema, pleural effusion or confluent pulmonary opacity.  IMPRESSION: Lower lung volumes, otherwise no acute cardiopulmonary abnormality.   Electronically Signed   By: Augusto Gamble M.D.   On: 06/22/2013 16:41   Dg Chest Port 1 View  05/27/2013   *RADIOLOGY REPORT*  Clinical Data: Shortness of breath.  PORTABLE CHEST - 1 VIEW  Comparison: Plain film  of the chest 05/20/2013 and PA and lateral chest 05/08/2013.  Findings: Subsegmental atelectasis is seen in the left lung base. The lungs are otherwise clear.  Heart size is upper normal.  No pneumothorax or pleural fluid.  IMPRESSION: No acute disease.   Original Report Authenticated By: Holley Dexter, M.D.   Dg Abd Portable 1v  06/22/2013   CLINICAL DATA:  Left lower quadrant pain.  EXAM: PORTABLE ABDOMEN - 1 VIEW  COMPARISON:  None.  FINDINGS: The bowel gas pattern is normal. No radio-opaque calculi or other significant radiographic abnormality are seen.  IMPRESSION: No evidence of bowel obstruction or free air.   Electronically Signed   By: Charlett Nose M.D.   On: 06/22/2013 21:06   ASSESSMENT AND PLAN:  1.  Mobitz (type) I (Wenckebach's) atrioventricular block and  transient CHB - It has been felt that her 2-1 block his Mobitz type I. We know that her heart rate increases when she is up and around. However her resting heart rate appears to be approximately 36. She had transient complete HB at 20:05PM last night 9/13 - Will ask electrophysiology to assess . Question whether low heart rate really is causing significant symptoms.  2.  CKD (chronic kidney disease), stage III - hold Lasix today due to increased creatinine and need for cath in am 3.  Shortness of breath - Etiology of her exertional shortness of breath remains unclear. She was ambulated at [facility] and dropped her O2 sat to 79%. Her recent echo did not give any information about her right heart pressures. Chest x-ray has not shown marked CHF and only low lung volumes. - We will proceed with right and left heart catheterization tomorrow.  - May  even want to consider an in house cardiopulmonary exercise test if this appears to be appropriate.  - will get pulmonary consultation.  4. Chest pain - Patient has had Some vague chest pain. Catheterization will give [place] more information about this issue.  5.  Fatigue with unclear etiology.  She is not anemic.  Will check a TSH. 5.  Abdominal pain of ? Etiology - abdominal xray yesterday with normal bowel gas pattern.  She says she feels like she has to urinate a lot so I will get a UA to make sure she does not have a UTI     Quintella Reichert, MD  06/23/2013  8:37 AM

## 2013-06-24 ENCOUNTER — Other Ambulatory Visit: Payer: Self-pay

## 2013-06-24 ENCOUNTER — Encounter (HOSPITAL_COMMUNITY)
Admission: AD | Disposition: A | Discharge status: Home / Self Care | Payer: Self-pay | Source: Other Acute Inpatient Hospital | Attending: Cardiology

## 2013-06-24 DIAGNOSIS — J9600 Acute respiratory failure, unspecified whether with hypoxia or hypercapnia: Secondary | ICD-10-CM

## 2013-06-24 DIAGNOSIS — I2510 Atherosclerotic heart disease of native coronary artery without angina pectoris: Secondary | ICD-10-CM

## 2013-06-24 DIAGNOSIS — I442 Atrioventricular block, complete: Principal | ICD-10-CM

## 2013-06-24 HISTORY — PX: LEFT AND RIGHT HEART CATHETERIZATION WITH CORONARY ANGIOGRAM: SHX5449

## 2013-06-24 HISTORY — PX: PERMANENT PACEMAKER INSERTION: SHX5480

## 2013-06-24 LAB — GLUCOSE, CAPILLARY
Glucose-Capillary: 201 mg/dL — ABNORMAL HIGH (ref 70–99)
Glucose-Capillary: 156 mg/dL — ABNORMAL HIGH (ref 70–99)
Glucose-Capillary: 112 mg/dL — ABNORMAL HIGH (ref 70–99)
Glucose-Capillary: 106 mg/dL — ABNORMAL HIGH (ref 70–99)
Glucose-Capillary: 221 mg/dL — ABNORMAL HIGH (ref 70–99)

## 2013-06-24 LAB — BASIC METABOLIC PANEL
GFR calc Af Amer: 36 mL/min — ABNORMAL LOW (ref >90)
GFR calc non Af Amer: 31 mL/min — ABNORMAL LOW (ref >90)
Potassium: 4.2 mEq/L (ref 3.5–5.1)
Sodium: 132 mEq/L — ABNORMAL LOW (ref 135–145)

## 2013-06-24 LAB — POCT I-STAT 3, ART BLOOD GAS (G3+)
Acid-base deficit: 2 mmol/L (ref 0.0–2.0)
Bicarbonate: 24.4 mEq/L — ABNORMAL HIGH (ref 20.0–24.0)
O2 Saturation: 51 %
pH, Arterial: 7.359 (ref 7.350–7.450)
pO2, Arterial: 30 mmHg — CL (ref 80.0–100.0)

## 2013-06-24 SURGERY — PERMANENT PACEMAKER INSERTION
Anesthesia: LOCAL

## 2013-06-24 SURGERY — LEFT AND RIGHT HEART CATHETERIZATION WITH CORONARY ANGIOGRAM
Anesthesia: LOCAL

## 2013-06-24 MED ORDER — SODIUM CHLORIDE 0.9 % IV SOLN: INTRAVENOUS | Status: DC

## 2013-06-24 MED ORDER — FENTANYL CITRATE 0.05 MG/ML IJ SOLN
INTRAMUSCULAR | Status: AC
  Filled 2013-06-24: qty 2

## 2013-06-24 MED ORDER — LIDOCAINE HCL (PF) 1 % IJ SOLN
INTRAMUSCULAR | Status: AC
  Filled 2013-06-24: qty 60

## 2013-06-24 MED ORDER — HEPARIN (PORCINE) IN NACL 2-0.9 UNIT/ML-% IJ SOLN
INTRAMUSCULAR | Status: AC
  Filled 2013-06-24: qty 1000

## 2013-06-24 MED ORDER — SODIUM CHLORIDE 0.9 % IV SOLN: INTRAVENOUS | Status: AC

## 2013-06-24 MED ORDER — MIDAZOLAM HCL 5 MG/5ML IJ SOLN
INTRAMUSCULAR | Status: AC
  Filled 2013-06-24: qty 5

## 2013-06-24 MED ORDER — SODIUM CHLORIDE 0.9 % IR SOLN
Status: DC
  Filled 2013-06-24: qty 2

## 2013-06-24 MED ORDER — CEFAZOLIN SODIUM 1-5 GM-% IV SOLN
1.0000 g | Freq: Four times a day (QID) | INTRAVENOUS | Status: AC
  Administered 2013-06-24 – 2013-06-25 (×3): 1 g via INTRAVENOUS
  Filled 2013-06-24 (×3): qty 50

## 2013-06-24 MED ORDER — ONDANSETRON HCL 4 MG/2ML IJ SOLN: 4.0000 mg | Freq: Four times a day (QID) | INTRAMUSCULAR | Status: DC | PRN

## 2013-06-24 MED ORDER — SODIUM CHLORIDE 0.9 % IR SOLN: 80.0000 mg | Status: DC

## 2013-06-24 MED ORDER — HEPARIN (PORCINE) IN NACL 2-0.9 UNIT/ML-% IJ SOLN
INTRAMUSCULAR | Status: AC
  Filled 2013-06-24: qty 500

## 2013-06-24 MED ORDER — INFLUENZA VAC SPLIT QUAD 0.5 ML IM SUSP
0.5000 mL | INTRAMUSCULAR | Status: AC
  Administered 2013-06-25: 0.5 mL via INTRAMUSCULAR
  Filled 2013-06-24: qty 0.5

## 2013-06-24 MED ORDER — ACETAMINOPHEN 325 MG PO TABS: 650.0000 mg | ORAL_TABLET | ORAL | Status: DC | PRN

## 2013-06-24 MED ORDER — NITROGLYCERIN 0.2 MG/ML ON CALL CATH LAB
INTRAVENOUS | Status: AC
  Filled 2013-06-24: qty 1

## 2013-06-24 MED ORDER — ACETAMINOPHEN 325 MG PO TABS
325.0000 mg | ORAL_TABLET | ORAL | Status: DC | PRN
  Administered 2013-06-25: 650 mg via ORAL
  Administered 2013-06-26: 325 mg via ORAL
  Filled 2013-06-24 (×3): qty 2

## 2013-06-24 MED ORDER — CEFAZOLIN SODIUM-DEXTROSE 2-3 GM-% IV SOLR
2.0000 g | INTRAVENOUS | Status: DC
  Filled 2013-06-24: qty 50

## 2013-06-24 MED ORDER — CEFAZOLIN SODIUM-DEXTROSE 2-3 GM-% IV SOLR: 2.0000 g | Freq: Four times a day (QID) | INTRAVENOUS | Status: DC

## 2013-06-24 MED ORDER — LIDOCAINE HCL (PF) 1 % IJ SOLN
INTRAMUSCULAR | Status: AC
  Filled 2013-06-24: qty 30

## 2013-06-24 NOTE — Progress Notes (Signed)
Called to see pt re: possible CHB.  Pt has been feeling very weak, some SOB, not new. No chest pain.  Telemetry reviewed. She has been in Mobitz II heart block but now seems to be going in/out of CHB. She is maintaining her BP. She describes herself as "droggy" but has had Neurontin 700 mg today plus amlodpine 5 mg. Her CBG is OK.  Spoke with Dr. Myrtis Ser. It is important to get the R/L heart cath done today. Have checked an ECG (2nd degree HB) and have telemetry strips to review. Will d/c amlodipine for now and follow HR/BP. After cath results reviewed, decide on further eval, possibly by EP.

## 2013-06-24 NOTE — Progress Notes (Signed)
PULMONARY  / CRITICAL CARE MEDICINE  Name: Becky Collins MRN: [id number] DOB: 04/14/1932    ADMISSION DATE:  06/21/2013 CONSULTATION DATE:  9/14  REFERRING MD :  Dr. Jens Som PRIMARY SERVICE:  Cardiology  CHIEF COMPLAINT:  Dyspnea  BRIEF PATIENT DESCRIPTION:   79 yo never smoker transferred from [facility] hospital with progressive dyspnea.  PCCM consulted to assist with assessment.    STUDIES:  05/21/13 Echo >> EF 55 to 60%, mod MR 06/20/13 V/Q scan >> very low probability for PE 06/24/13 Rt/Lt heart cath >> 3 vessel CAD, Rt heart pressures not elevated 06/24/13 Insertion dual chamber pacemaker for symptomatic 2:1 heart block  CULTURES: 9/14 UC>>>  ANTIBIOTICS:   SUBJECTIVE:   Had heart cath and pacer placement yesterday.  She feels her breathing has improved since being in hospital.  She denies cough, wheeze, or chest tightness.  She has not been very active at home.  VITAL SIGNS: Temp:  [98.1 F (36.7 C)-98.7 F (37.1 C)] 98.1 F (36.7 C) (09/15 0500) Pulse Rate:  [43-79] 79 (09/15 0500) Resp:  [17-19] 17 (09/15 0500) BP: (153-167)/(31-49) 167/40 mmHg (09/15 0500) SpO2:  [100 %] 100 % (09/15 0500) Room air  PHYSICAL EXAMINATION: General: No distress, weak voice quality Neuro: CN intact, normal strength HEENT: no sinus tenderness, edentulous Cardiovascular:  s1s2 distant, pacer site dressing clean in Lt upper chest Lungs: no wheeze, rales, strong cough Abdomen: soft, non tender Musculoskeletal:  No edema Skin: no rashes  Labs: CBC Recent Labs     06/23/13  2032  WBC  5.7  HGB  10.8*  HCT  35.1*  PLT  255    Coag's Recent Labs     06/23/13  2032  INR  1.03    BMET Recent Labs     06/23/13  2032  06/24/13  0959  NA  132*  132*  K  4.3  4.2  CL  96  99  CO2  21  23  BUN  20  21  CREATININE  1.63*  1.53*  GLUCOSE  239*  202*    Electrolytes Recent Labs     06/23/13  2032  06/24/13  0959  CALCIUM  9.8  9.9    ABG Recent Labs   06/24/13  1434  PHART  7.359  7.319*  PCO2ART  43.3  47.6*  PO2ART  210.0*  30.0*   Glucose Recent Labs     06/24/13  0721  06/24/13  1205  06/24/13  1725  06/24/13  1811  06/24/13  2120  06/25/13  0729  GLUCAP  201*  156*  112*  106*  221*  182*    Imaging Dg Chest 2 View  06/25/2013   *RADIOLOGY REPORT*  Clinical Data: Status post pacemaker insertion  CHEST - 2 VIEW  Comparison: 06/22/2013  Findings: Cardiac shadow is stable. A pacing device is now noted. No pneumothorax is seen. The lungs are clear bilaterally.  No acute bony abnormality is seen.  IMPRESSION: No acute abnormality noted following pacemaker placement.   Original Report Authenticated By: Alcide Clever, M.D.      ASSESSMENT / PLAN:  A: Dyspnea >> likely related to CAD, bradyarrhythmia, valvular heart disease, and deconditioning.  Pulmonary evaluation to date has been otherwise unremarkable.  Anxiety/depression may also be contributing to her sensation of dyspnea. P: -can arrange for outpt PFT's for completeness of evaluation >> clinical suspicion for obstructive lung disease is low -cardiology managing CAD, arrhythmia, valvular heart disease -likely would  benefit from monitored exercise program once cardiac status is stable  No additional inpatient pulmonary testing needed at this time.  PCCM will sign off.  She does not need outpt pulmonary follow up unless her dyspnea symptoms persist after optimizing her cardiac function and improving her exercise tolerance, or if she has abnormal findings on outpt PFT.  Canary Brim, NP-C [facility] Pgr: [phone] or [phone]  Coralyn Helling, MD [facility] Pulmonary/Critical Care 06/25/2013, 10:14 AM Pager:  [phone] After 3pm call: [phone]

## 2013-06-24 NOTE — Consult Note (Signed)
Reason for Consult: 2:1 and complete heart block  Referring Physician: [name] is an 79 y.o. female.   HPI: The patient is a 79 yo woman who presented with one month of weakness, sob, and fatigue. She has been in 2:1 AV block and complete heart block. She has undergone left heart cath which demonstrates significant CAD but no acute lesions. She has extensive collateralization. Previously she had preserved LV function. No reversible causes for her heart block. She denies syncope but does have near syncope.  PMH: Past Medical History  Diagnosis Date  . Hypertension     moderate left ventricular hypertrophy  . Diabetes mellitus     h/o negative exercise stress test/normal LVF assessed June 2011 by echo EF 60-65%  . Hyperlipidemia   . Carotid artery disease     with less than 60% stenosis on the right in 2006  . History of seizure disorder     on depakote  . History of recurrent UTIs   . Chronic edema   . Depression   . Acid reflux disease   . History of medication noncompliance     this has been ongoing for quite sometime  . Dysrhythmia     Second-degree AVB Mobitz 1, 11/12; history of PAT  . Chronic kidney disease   . Stroke     .  Marland Kitchen Ejection fraction     .  Marland Kitchen Mitral regurgitation   . CKD (chronic kidney disease), stage III   . Orthostatic hypotension   . Shortness of breath     PSHX: Past Surgical History  Procedure Laterality Date  . Cesarean section  1973    FAMHX: Family History  Problem Relation Age of Onset  . Heart attack Father   . Heart attack Mother   . Diabetes Sister   . Hypertension Sister   . Peripheral vascular disease Sister   . Diabetes Brother   . Heart disease Brother   . Hypertension Brother     Social History:  reports that she has never smoked. She has never used smokeless tobacco. She reports that she does not drink alcohol or use illicit drugs.  Allergies: No Known Allergies  Medications: reviewed Dg Chest 2  View  06/22/2013   CLINICAL DATA:  79 year old female shortness of Breath.  EXAM: CHEST  2 VIEW  COMPARISON:  06/19/2013 and earlier.  FINDINGS: Upright AP and lateral views. Lower lung volumes. Stable cardiomegaly and mediastinal contours. Visualized tracheal air column is within normal limits. No pneumothorax, pulmonary edema, pleural effusion or confluent pulmonary opacity.  IMPRESSION: Lower lung volumes, otherwise no acute cardiopulmonary abnormality.   Electronically Signed   By: Augusto Gamble M.D.   On: 06/22/2013 16:41   Dg Abd Portable 1v  06/22/2013   CLINICAL DATA:  Left lower quadrant pain.  EXAM: PORTABLE ABDOMEN - 1 VIEW  COMPARISON:  None.  FINDINGS: The bowel gas pattern is normal. No radio-opaque calculi or other significant radiographic abnormality are seen.  IMPRESSION: No evidence of bowel obstruction or free air.   Electronically Signed   By: Charlett Nose M.D.   On: 06/22/2013 21:06    ROS  As stated in the HPI and negative for all other systems.  Physical Exam  Vitals:Blood pressure 111/45, pulse 35, temperature 98.1 F (36.7 C), temperature source Oral, resp. rate 17, weight 202 lb 3.2 oz (91.717 kg), SpO2 100.00%.  elderly appearing NAD HEENT: Unremarkable Neck:  No JVD, no thyromegally Lymphatics:  No  adenopathy Back:  No CVA tenderness Lungs:  Clear HEART:  Regular rate rhythm, no murmurs, no rubs, no clicks Abd:  Flat, positive bowel sounds, no organomegally, no rebound, no guarding Ext:  2 plus pulses, no edema, no cyanosis, no clubbing Skin:  No rashes no nodules Neuro:  CN II through XII intact, motor grossly intact  Assessment/Plan: 1. CHB with 2:1 AVBlock 2. CAD 3. HTN Rec: i have discussed the risks/benefits/goals/expectations of PPM with the patient and she wishes to proceed. Becky Gowda TaylorMD 06/24/2013, 3:05 PM

## 2013-06-24 NOTE — Progress Notes (Signed)
Patient ID: Becky Collins, female   DOB: [birth year], 79 y.o.   MRN: [id number]   SUBJECTIVE:  This patient is scheduled for right and left heart cath today. I have requested pulmonary consult. I have reviewed the note from Dr. Shelle Iron and appreciate the help greatly. We will await for pulmonary continued input. I've also spoken with electrophysiology to ask them to review her rhythm again. All of this data will be gathered over the next day or 2.   Filed Vitals:   06/23/13 1541 06/23/13 2121 06/24/13 0500 06/24/13 1052  BP: 157/45 163/31 167/40 127/50  Pulse: 46 43 79 78  Temp: 98.3 F (36.8 C) 98.7 F (37.1 C) 98.1 F (36.7 C)   TempSrc: Oral Oral    Resp: 19 18 17    Weight:      SpO2: 100% 100% 100%     Intake/Output Summary (Last 24 hours) at 06/24/13 1340 Last data filed at 06/23/13 1900  Gross per 24 hour  Intake    240 ml  Output    400 ml  Net   -160 ml    LABS: Basic Metabolic Panel:  Recent Labs  40/10/27 2032 06/24/13 0959  NA 132* 132*  K 4.3 4.2  CL 96 99  CO2 21 23  GLUCOSE 239* 202*  BUN 20 21  CREATININE 1.63* 1.53*  CALCIUM 9.8 9.9   Liver Function Tests: No results found for this basename: AST, ALT, ALKPHOS, BILITOT, PROT, ALBUMIN,  in the last 72 hours No results found for this basename: LIPASE, AMYLASE,  in the last 72 hours CBC:  Recent Labs  06/21/13 1635 06/23/13 2032  WBC 6.2 5.7  HGB 11.7* 10.8*  HCT 37.5 35.1*  MCV 83.0 82.4  PLT 286 255   Cardiac Enzymes: No results found for this basename: CKTOTAL, CKMB, CKMBINDEX, TROPONINI,  in the last 72 hours BNP: No components found with this basename: POCBNP,  D-Dimer: No results found for this basename: DDIMER,  in the last 72 hours Hemoglobin A1C: No results found for this basename: HGBA1C,  in the last 72 hours Fasting Lipid Panel: No results found for this basename: CHOL, HDL, LDLCALC, TRIG, CHOLHDL, LDLDIRECT,  in the last 72 hours Thyroid Function Tests:  Recent Labs   06/23/13 0930  TSH 2.048    RADIOLOGY: Dg Chest 2 View  06/22/2013   CLINICAL DATA:  79 year old female shortness of Breath.  EXAM: CHEST  2 VIEW  COMPARISON:  06/19/2013 and earlier.  FINDINGS: Upright AP and lateral views. Lower lung volumes. Stable cardiomegaly and mediastinal contours. Visualized tracheal air column is within normal limits. No pneumothorax, pulmonary edema, pleural effusion or confluent pulmonary opacity.  IMPRESSION: Lower lung volumes, otherwise no acute cardiopulmonary abnormality.   Electronically Signed   By: Augusto Gamble M.D.   On: 06/22/2013 16:41   Dg Chest Port 1 View  05/27/2013   *RADIOLOGY REPORT*  Clinical Data: Shortness of breath.  PORTABLE CHEST - 1 VIEW  Comparison: Plain film of the chest 05/20/2013 and PA and lateral chest 05/08/2013.  Findings: Subsegmental atelectasis is seen in the left lung base. The lungs are otherwise clear.  Heart size is upper normal.  No pneumothorax or pleural fluid.  IMPRESSION: No acute disease.   Original Report Authenticated By: Holley Dexter, M.D.   Dg Abd Portable 1v  06/22/2013   CLINICAL DATA:  Left lower quadrant pain.  EXAM: PORTABLE ABDOMEN - 1 VIEW  COMPARISON:  None.  FINDINGS: The bowel gas pattern  is normal. No radio-opaque calculi or other significant radiographic abnormality are seen.  IMPRESSION: No evidence of bowel obstruction or free air.   Electronically Signed   By: Charlett Nose M.D.   On: 06/22/2013 21:06    PHYSICAL EXAM  patient is stable. She always appears fatigued when you talk to her. I spoke the granddaughter in the room. Patient is oriented to person time and place. Lungs reveal no obvious rales. Cardiac exam reveals S1 and S2. Abdomen is soft. There is no significant peripheral edema.   TELEMETRY: The rhythm reveals 2-1 AV block. At rest or QRS rate is in the range of 36. We have continued to think that this is Mobitz type I, second-degree AV block. I am not completely sure.   ASSESSMENT AND  PLAN:    Mobitz (type) I (Wenckebach's) atrioventricular block     Electrophysiology will see the patient in consultation today.    Ejection fraction      Her ejection fraction most recently was normal.    Mitral regurgitation      By echo her mitral regurgitation was moderate.    CKD (chronic kidney disease), stage III      Creatinine is slightly better 1.5. Overall I and O is difficult to assess. It may be slightly negative since she's been here. I do not have her on a diuretic at this time.    Shortness of breath     The evaluation of her exertional shortness of breath is ongoing. It is quite marked. She is not able to walk across the room. We will follow with the pulmonary evaluation. Electrophysiology will assess her rhythm further. We have not proven chronotropic incompetence at this point. Right and left heart cath will give [place] more information today. The patient cannot be discharged from the hospital until we have a definite answer and a definite approach to her exertional shortness of breath. There is nothing else that we will be able to do on an outpatient basis. I will not be rounding on the patient tomorrow. I can be reached on my cell phone at any time.    Chest pain     Because there has been some chest discomfort we will be setting her coronary arteries today.    Fatigue    The overall fatigued she appears to have lying in bed is difficult to assess. She does not look acutely short of breath in that setting.   Willa Rough 06/24/2013 1:40 PM

## 2013-06-24 NOTE — CV Procedure (Signed)
   Cardiac Catheterization Procedure Note  Name: Becky Collins MRN: [id number] DOB: 10/18/1931  Procedure: Right Heart Cath, Left Heart Cath, Selective Coronary Angiography, LV angiography  Indication:  Fatigue dyspnea, chest pain, CHB  Procedural Details: The right groin was prepped, draped, and anesthetized with 1% lidocaine. Using the modified Seldinger technique a 5 French sheath was placed in the right femoral artery and a 7 French sheath was placed in the right femoral vein. A Swan-Ganz catheter was used for the right heart catheterization. Standard protocol was followed for recording of right heart pressures and sampling of oxygen saturations. Fick cardiac output was calculated. Standard Judkins catheters were used for selective coronary angiography and left ventriculography. There were no immediate procedural complications. The patient was transferred to the post catheterization recovery area for further monitoring.  Procedural Findings:  Hemodynamics:               RA 9    RV 41/6    PA 37/12 Mean 20    PCWP 11    LV NA    AO 140/49   Oxygen saturations:    PA 51    AO 100   Cardiac Output (Fick) 3.62                               Cardiac Index (Fick) 1.85   Coronary angiography:  Coronary dominance: Right  Left mainstem:   Distal 25%.    Left anterior descending (LAD): Heavily calcified.  Mid LAD 70% stenosis after mid diagonal.  Mid diagonal large ostial 25%.    Left circumflex (LCx): AV groove 95% after OM1, OM1 small with high grade diffuse proximal stenosis.  Distal 80% stenosis before later PL. PL is moderate sized and normal.    Right coronary artery (RCA): 100% ostial stenosis.  Left to right collateral flow predominantly from the LAD.   Left ventriculography: LV not injected.   Final Conclusions:  Native 3 vessel CAD.  This appears to be chronic.  Right heart pressures are not elevated.   Recommendations:   Discussed with Dr. Myrtis Ser and Dr. Ladona Ridgel.  She is  found to have complete heart block and she will need a pacemaker.  She will have medical management for her CAD.  However, she will need be further evaluated for possible revascularization if symptoms continue.     Becky Collins 06/24/2013, 2:54 PM

## 2013-06-24 NOTE — Progress Notes (Signed)
Patient's heart rate down to 30's in complete heart block.  Continues to go in and out of complete heart block versus 2nd degree heart block type I/II.  Theodore Demark PA on floor and made aware.  Will continue to monitor.  Colman Cater

## 2013-06-24 NOTE — CV Procedure (Signed)
DDD PPM insertion via the left subclavian vein without immediate complication. [id number].

## 2013-06-25 ENCOUNTER — Inpatient Hospital Stay (HOSPITAL_COMMUNITY): Payer: Medicare Other

## 2013-06-25 ENCOUNTER — Encounter (HOSPITAL_COMMUNITY): Payer: Self-pay | Admitting: *Deleted

## 2013-06-25 ENCOUNTER — Encounter (HOSPITAL_COMMUNITY): Payer: Medicare Other

## 2013-06-25 DIAGNOSIS — I441 Atrioventricular block, second degree: Secondary | ICD-10-CM

## 2013-06-25 LAB — GLUCOSE, CAPILLARY
Glucose-Capillary: 182 mg/dL — ABNORMAL HIGH (ref 70–99)
Glucose-Capillary: 231 mg/dL — ABNORMAL HIGH (ref 70–99)
Glucose-Capillary: 242 mg/dL — ABNORMAL HIGH (ref 70–99)

## 2013-06-25 MED ORDER — YOU HAVE A PACEMAKER BOOK
Freq: Once | Status: AC
  Administered 2013-06-26: 08:00:00
  Filled 2013-06-25: qty 1

## 2013-06-25 NOTE — Evaluation (Signed)
Physical Therapy Evaluation Patient Details Name: Becky Collins MRN: [id number] DOB: [birth year] Today's Date: 06/25/2013 Time: 6578-4696 PT Time Calculation (min): 15 min  PT Assessment / Plan / Recommendation History of Present Illness  Pt adm with symptomatic heart block and under pacer placement on 06/24/13.  Also heart cath on 06/24/13 showing 3V CAD.  Clinical Impression  Pt admitted with above. Pt currently with functional limitations due to the deficits listed below (see PT Problem List).  Pt will benefit from skilled PT to increase their independence and safety with mobility to allow discharge to the venue listed below. Pt stated her niece is coming to stay with her to help out when she is initially dc'd.      PT Assessment  Patient needs continued PT services    Follow Up Recommendations  Home health PT;Supervision for mobility/OOB    Does the patient have the potential to tolerate intense rehabilitation      Barriers to Discharge        Equipment Recommendations  None recommended by PT    Recommendations for Other Services     Frequency Min 3X/week    Precautions / Restrictions Precautions Precautions: Fall;ICD/Pacemaker   Pertinent Vitals/Pain VSS      Mobility  Bed Mobility Bed Mobility: Supine to Sit;Sitting - Scoot to Edge of Bed Supine to Sit: 4: Min assist;HOB elevated Sitting - Scoot to [place] of Bed: 4: Min assist Details for Bed Mobility Assistance: Assist to bring trunk up and hips to EOB. Transfers Transfers: Sit to Stand;Stand to Sit Sit to Stand: 4: Min assist;With upper extremity assist;From bed Stand to Sit: 4: Min assist;With upper extremity assist;With armrests;To chair/3-in-1 Details for Transfer Assistance: Verbal cues for hand placement. Ambulation/Gait Ambulation/Gait Assistance: 4: Min assist Ambulation Distance (Feet): 40 Feet Assistive device: Rolling walker Ambulation/Gait Assistance Details: Dyspnea 2/4 with amb.   Gait Pattern:  Step-through pattern;Decreased step length - right;Decreased step length - left;Shuffle Gait velocity: decr    Exercises     PT Diagnosis: Difficulty walking;Generalized weakness  PT Problem List: Decreased strength;Decreased activity tolerance;Decreased balance;Decreased mobility;Decreased knowledge of precautions PT Treatment Interventions: DME instruction;Gait training;Functional mobility training;Therapeutic activities;Therapeutic exercise;Balance training;Patient/family education     PT Goals(Current goals can be found in the care plan section) Acute Rehab PT Goals Patient Stated Goal: Return home PT Goal Formulation: With patient Time For Goal Achievement: 06/29/13 Potential to Achieve Goals: Good  Visit Information  Last PT Received On: 06/25/13 Assistance Needed: +1 History of Present Illness: Pt adm with symptomatic heart block and under pacer placement on 06/24/13.  Also heart cath on 06/24/13 showing 3V CAD.       Prior Functioning  Home Living Family/patient expects to be discharged to:: Private residence Living Arrangements: Children;Other relatives (Niece will stay with pt initially.) Available Help at Discharge: Family;Available 24 hours/day Type of Home: House Home Access: Level entry Home Layout: One level Home Equipment: Bedside commode;Walker - 2 wheels;Cane - single point;Wheelchair - manual Prior Function Comments: Amb with walker.  Had just dc'd home from SNF 2 days prior to admission. Communication Communication: No difficulties Dominant Hand: Right    Cognition  Cognition Arousal/Alertness: Awake/alert Behavior During Therapy: WFL for tasks assessed/performed Overall Cognitive Status: Within Functional Limits for tasks assessed    Extremity/Trunk Assessment Upper Extremity Assessment Upper Extremity Assessment: LUE deficits/detail LUE Deficits / Details: Limited range of motion due to pacer precautions. Lower Extremity Assessment Lower Extremity  Assessment: Generalized weakness   Balance Balance Balance Assessed:  Yes Static Standing Balance Static Standing - Balance Support: Bilateral upper extremity supported Static Standing - Level of Assistance: 4: Min assist  End of Session PT - End of Session Equipment Utilized During Treatment: Gait belt Activity Tolerance: Patient limited by fatigue Patient left: in chair;with call bell/phone within reach  GP     [facility] 06/25/2013, 3:46 PM  [facility] PT [phone]

## 2013-06-25 NOTE — Care Management Note (Signed)
    Page 1 of 2   06/26/2013     12:28:25 PM   CARE MANAGEMENT NOTE 06/26/2013  Patient:  Becky Collins, Becky Collins   Account Number:  [id number]  Date Initiated:  06/25/2013  Documentation initiated by:  GRAVES-BIGELOW,Alon Mazor  Subjective/Objective Assessment:   Pt admitted -transferred from [facility] 2/2 progressive fatigue and hypoxia. Insertion dual chamber pacemaker for symptomatic 2:1 heart block.     Action/Plan:   CM will continue to monitor for dispostion needs.   Anticipated DC Date:  06/27/2013   Anticipated DC Plan:  HOME/SELF CARE      DC Planning Services  CM consult      PAC Choice  Resumption Of Svcs/PTA Provider   Choice offered to / List presented to:  C-1 Patient        HH arranged  HH-1 RN  HH-10 DISEASE MANAGEMENT  HH-2 PT  HH-3 OT      HH agency  [facility]   Status of service:  Completed, signed off Medicare Important Message given?   (If response is "NO", the following Medicare IM given date fields will be blank) Date Medicare IM given:   Date Additional Medicare IM given:    Discharge Disposition:  HOME W HOME HEALTH SERVICES  Per UR Regulation:  Reviewed for med. necessity/level of care/duration of stay  If discussed at Long Length of Stay Meetings, dates discussed:    Comments:  06-26-13 [address] Tomi Bamberger, RN,BSN [phone] CM did offer choice for [facility] services. Pt is active with [facility] for services. Referral made for resumption. SOC to begin within 24-48 hours post d/c. No further needs from CM at this time.

## 2013-06-25 NOTE — Op Note (Signed)
[name], Becky Collins                  ACCOUNT NO.:  [id number]  MEDICAL RECORD NO.:  [id number]  LOCATION:  3W21C                        FACILITY:  [facility]  PHYSICIAN:  Doylene Canning. Ladona Ridgel, MD    DATE OF BIRTH:  Apr 16, 1932  DATE OF PROCEDURE:  06/24/2013 DATE OF DISCHARGE:                              OPERATIVE REPORT   PROCEDURE PERFORMED:  Insertion of dual-chamber pacemaker.  INDICATIONS:  Symptomatic complete heart block, alternating with symptomatic 2:1 heart block.  INTRODUCTION:  The patient is a very pleasant 79 year old woman who presented to the hospital with shortness of breath and fatigue.  She was subsequently found to be in 2:1 heart block and at times complete heart block.  She is now referred for insertion of a dual-chamber pacemaker. She was on no reversible agents, which would have caused her symptoms.  PROCEDURE IN DETAIL:  After informed consent was obtained, the patient was taken to the diagnostic EP lab in a fasting state.  After usual preparation and draping, intravenous fentanyl and midazolam were given for sedation.  A 30 mL of lidocaine was infiltrated into the left infraclavicular region.  A 5-cm incision was carried out over this region.  Electrocautery was utilized to dissect down to the fascial plane.  The subclavian vein was then punctured x2 and a Medtronic model 5076, 52-cm active fixation pacing lead serial number [id number] was advanced into the right ventricle.  The Medtronic model [id number], 45-cm active fixation pacing lead serial number [id number] was advanced to the right atrium.  Mapping was first carried out in the right ventricle and at the final site, R-waves measured 28 mV with the lead actively fixed. The pacing impedance was 900 ohms and the threshold was 0.5 milliseconds.  A 10 V pacing not stimulate the diaphragm.  With these satisfactory parameters, attention was then turned to the placement of the atrial lead, was placed in the anterolateral  portion of the right atrium where P-waves measured at 2.5 to 2 mV.  With the lead actively fixed, the pacing impedance was 532 ohms.  A 10 V pacing not stimulate the diaphragm.  The pacing threshold 0.6 V at 0.5 milliseconds.  A 10 V pacing not stimulate the diaphragm.  With these satisfactory parameters, the leads were secured to the subpectoral fascia with a figure-of-eight silk suture.  The sewing sleeve was secured with silk suture.  Electrocautery was utilized to make a subcutaneous pocket.  Antibiotic irrigation was utilized to irrigate the pocket and electrocautery was utilized to assure hemostasis.  The Medtronic Adapta L dual-chamber pacemaker, serial number [id number] was connected to the atrial and ventricular pacing lead and placed back in the subcutaneous pocket.  The pocket was irrigated with antibiotic irrigation.  The incision was closed with 2-0 and 3-0 Vicryl.  Benzoin and Steri-Strips were painted on the skin, pressure dressing was applied, and the patient was returned to her room in satisfactory condition.  COMPLICATIONS:  There were no immediate procedure complications.  RESULTS:  This demonstrates successful implantation of a Medtronic dual- chamber pacemaker in a patient with symptomatic complete heart block and ventricular escape of 34 beats per minute.     Doylene Canning.  Ladona Ridgel, MD     GWT/MEDQ  D:  06/24/2013  T:  06/25/2013  Job:  [id number]

## 2013-06-25 NOTE — Progress Notes (Signed)
Inpatient Diabetes Program Recommendations  AACE/Kandra: New Consensus Statement on Inpatient Glycemic Control (2013)  Target Ranges:  Prepandial:   less than 140 mg/dL      Peak postprandial:   less than 180 mg/dL (1-2 hours)      Critically ill patients:  140 - 180 mg/dL   Reason for Assessment:  Sub-optimal glycemic control   Results for Becky Collins, Becky Collins (MRN [id number]) as of 06/25/2013 16:49  Ref. Range 06/25/2013 07:29 06/25/2013 11:05 06/25/2013 16:07  Glucose-Capillary Latest Range: 70-99 mg/dL 469 (H) 629 (H) 528 (H)   Note:  CBG's indicate meal intake is causing CBG's to rise above target levels.  Request MD consider adding Novolog 4 units as meal coverage tid with meals in addition to correction scale to be given if patient eats at least 50% of meal and CBG > 80 mg/dl.  Thank you.  Harshitha Fretz S. Elsie Lincoln, RN, CNS, CDE Inpatient Diabetes Program, team pager [phone]

## 2013-06-25 NOTE — Progress Notes (Signed)
SUBJECTIVE: The patient is doing well today.  At this time, she denies chest pain, shortness of breath, or any new concerns.  She states she is feeling much improved since her pacemaker implant yesterday. Still with some weakness, and has not ambulated yet.  She doesn't feel ready to go home today.  CURRENT MEDICATIONS: . amLODipine  5 mg Oral Daily  . aspirin EC  81 mg Oral QHS  . brimonidine  1 drop Both Eyes BID  . calcium-vitamin D  1 tablet Oral Q breakfast  . cholecalciferol  1,000 Units Oral Daily  . gabapentin  700 mg Oral BID  . heparin  5,000 Units Subcutaneous Q8H  . influenza vac split quadrivalent PF  0.5 mL Intramuscular Tomorrow-1000  . insulin aspart  0-15 Units Subcutaneous TID WC  . insulin glargine  20 Units Subcutaneous BID  . lactulose  20 g Oral BID  . pantoprazole  40 mg Oral Daily  . sertraline  100 mg Oral q morning - 10a  . sodium chloride  3 mL Intravenous Q12H   . sodium chloride    . sodium chloride 50 mL/hr at 06/24/13 0550  . sodium chloride      OBJECTIVE: Physical Exam: Filed Vitals:   06/24/13 2000 06/24/13 2100 06/24/13 2200 06/25/13 0500  BP: 132/52 113/42 134/48 139/44  Pulse: 70 74 68 69  Temp: 97.6 F (36.4 C) 98 F (36.7 C) 97.5 F (36.4 C) 99 F (37.2 C)  TempSrc:      Resp: 18 20 18 16   Weight:      SpO2: 98% 98% 95% 95%    Intake/Output Summary (Last 24 hours) at 06/25/13 0853 Last data filed at 06/25/13 0500  Gross per 24 hour  Intake    240 ml  Output   1175 ml  Net   -935 ml    Telemetry reveals P synchronous pacing  GEN- The patient is well appearing, alert and oriented x 3 today.   Head- normocephalic, atraumatic Eyes-  Sclera clear, conjunctiva pink Ears- hearing intact Oropharynx- clear Neck- supple  Lungs- Clear to ausculation bilaterally, normal work of breathing Heart- Regular rate and rhythm  GI- soft, NT, ND, + BS Extremities- no clubbing, cyanosis, or edema Pacemaker pocket without significant  hematoma, there is some saturation of the dressing  LABS: Basic Metabolic Panel:  Recent Labs  09/81/19 2032 06/24/13 0959  NA 132* 132*  K 4.3 4.2  CL 96 99  CO2 21 23  GLUCOSE 239* 202*  BUN 20 21  CREATININE 1.63* 1.53*  CALCIUM 9.8 9.9   CBC:  Recent Labs  06/23/13 2032  WBC 5.7  HGB 10.8*  HCT 35.1*  MCV 82.4  PLT 255   Thyroid Function Tests:  Recent Labs  06/23/13 0930  TSH 2.048   RADIOLOGY: Dg Chest 2 View 06/25/2013   *RADIOLOGY REPORT*  Clinical Data: Status post pacemaker insertion  CHEST - 2 VIEW  Comparison: 06/22/2013  Findings: Cardiac shadow is stable. A pacing device is now noted. No pneumothorax is seen. The lungs are clear bilaterally.  No acute bony abnormality is seen.  IMPRESSION: No acute abnormality noted following pacemaker placement.   Original Report Authenticated By: Alcide Clever, M.D.   ASSESSMENT AND PLAN:  Active Problems:   Mobitz (type) I (Wenckebach's) atrioventricular block   Ejection fraction   Mitral regurgitation   CKD (chronic kidney disease), stage III   Orthostatic hypotension   Shortness of breath   Chest pain  Fatigue  1. Second degree AV block Normal pacemaker function Interrogation reviewed in paper chart cxr is stable  2. htn Stable No change required today  3. CRI Stable No change required today Repeat bmet in am  PT to assess Anticipate discharge tomorrow am

## 2013-06-26 ENCOUNTER — Encounter (HOSPITAL_COMMUNITY): Payer: Self-pay | Admitting: Nurse Practitioner

## 2013-06-26 ENCOUNTER — Inpatient Hospital Stay (HOSPITAL_COMMUNITY): Payer: Medicare Other

## 2013-06-26 DIAGNOSIS — Z950 Presence of cardiac pacemaker: Secondary | ICD-10-CM | POA: Diagnosis not present

## 2013-06-26 DIAGNOSIS — I059 Rheumatic mitral valve disease, unspecified: Secondary | ICD-10-CM

## 2013-06-26 DIAGNOSIS — I442 Atrioventricular block, complete: Secondary | ICD-10-CM

## 2013-06-26 DIAGNOSIS — I2510 Atherosclerotic heart disease of native coronary artery without angina pectoris: Secondary | ICD-10-CM

## 2013-06-26 LAB — BASIC METABOLIC PANEL
BUN: 17 mg/dL (ref 6–23)
Calcium: 10 mg/dL (ref 8.4–10.5)
GFR calc non Af Amer: 31 mL/min — ABNORMAL LOW (ref >90)
Glucose, Bld: 220 mg/dL — ABNORMAL HIGH (ref 70–99)

## 2013-06-26 MED ORDER — ALBUTEROL SULFATE (5 MG/ML) 0.5% IN NEBU
2.5000 mg | INHALATION_SOLUTION | Freq: Once | RESPIRATORY_TRACT | Status: AC
  Administered 2013-06-26: 2.5 mg via RESPIRATORY_TRACT

## 2013-06-26 NOTE — Progress Notes (Signed)
DC orders received.  Patient stable with no S/S of distress.  Medication and discharge information reviewed with patient.  Patient DC home. Becky Collins  

## 2013-06-26 NOTE — Progress Notes (Signed)
Patient ID: Becky Collins, female   DOB: 19-Aug-1932, 79 y.o.   MRN: [id number]   SUBJECTIVE:  The patient just returned from pulmonary function studies. This is the first time that I've seen a smile on her face. She is dramatically improved. I am hopeful that this is all related to her pacemaker placement. She is very appreciative of the help that she is received.   Filed Vitals:   06/25/13 1412 06/25/13 2000 06/25/13 2019 06/26/13 0516  BP: 146/52  170/63 172/62  Pulse: 74  80 80  Temp: 98 F (36.7 C)  99.1 F (37.3 C) 97.3 F (36.3 C)  TempSrc: Oral  Oral Axillary  Resp: 18     Height:  5\' 3"  (1.6 m)    Weight:    193 lb 4.8 oz (87.68 kg)  SpO2: 99%  95% 98%    Intake/Output Summary (Last 24 hours) at 06/26/13 0815 Last data filed at 06/25/13 2205  Gross per 24 hour  Intake    726 ml  Output      0 ml  Net    726 ml    LABS: Basic Metabolic Panel:  Recent Labs  53/66/44 0959 06/26/13 0445  NA 132* 137  K 4.2 4.1  CL 99 102  CO2 23 23  GLUCOSE 202* 220*  BUN 21 17  CREATININE 1.53* 1.53*  CALCIUM 9.9 10.0   Liver Function Tests: No results found for this basename: AST, ALT, ALKPHOS, BILITOT, PROT, ALBUMIN,  in the last 72 hours No results found for this basename: LIPASE, AMYLASE,  in the last 72 hours CBC:  Recent Labs  06/23/13 2032  WBC 5.7  HGB 10.8*  HCT 35.1*  MCV 82.4  PLT 255   Cardiac Enzymes: No results found for this basename: CKTOTAL, CKMB, CKMBINDEX, TROPONINI,  in the last 72 hours BNP: No components found with this basename: POCBNP,  D-Dimer: No results found for this basename: DDIMER,  in the last 72 hours Hemoglobin A1C: No results found for this basename: HGBA1C,  in the last 72 hours Fasting Lipid Panel: No results found for this basename: CHOL, HDL, LDLCALC, TRIG, CHOLHDL, LDLDIRECT,  in the last 72 hours Thyroid Function Tests:  Recent Labs  06/23/13 0930  TSH 2.048    RADIOLOGY: Dg Chest 2 View  06/25/2013   *RADIOLOGY  REPORT*  Clinical Data: Status post pacemaker insertion  CHEST - 2 VIEW  Comparison: 06/22/2013  Findings: Cardiac shadow is stable. A pacing device is now noted. No pneumothorax is seen. The lungs are clear bilaterally.  No acute bony abnormality is seen.  IMPRESSION: No acute abnormality noted following pacemaker placement.   Original Report Authenticated By: Alcide Clever, M.D.   Dg Chest 2 View  06/22/2013   CLINICAL DATA:  79 year old female shortness of Breath.  EXAM: CHEST  2 VIEW  COMPARISON:  06/19/2013 and earlier.  FINDINGS: Upright AP and lateral views. Lower lung volumes. Stable cardiomegaly and mediastinal contours. Visualized tracheal air column is within normal limits. No pneumothorax, pulmonary edema, pleural effusion or confluent pulmonary opacity.  IMPRESSION: Lower lung volumes, otherwise no acute cardiopulmonary abnormality.   Electronically Signed   By: Augusto Gamble M.D.   On: 06/22/2013 16:41   Dg Chest Port 1 View  05/27/2013   *RADIOLOGY REPORT*  Clinical Data: Shortness of breath.  PORTABLE CHEST - 1 VIEW  Comparison: Plain film of the chest 05/20/2013 and PA and lateral chest 05/08/2013.  Findings: Subsegmental atelectasis is seen in  the left lung base. The lungs are otherwise clear.  Heart size is upper normal.  No pneumothorax or pleural fluid.  IMPRESSION: No acute disease.   Original Report Authenticated By: Holley Dexter, M.D.   Dg Abd Portable 1v  06/22/2013   CLINICAL DATA:  Left lower quadrant pain.  EXAM: PORTABLE ABDOMEN - 1 VIEW  COMPARISON:  None.  FINDINGS: The bowel gas pattern is normal. No radio-opaque calculi or other significant radiographic abnormality are seen.  IMPRESSION: No evidence of bowel obstruction or free air.   Electronically Signed   By: Charlett Nose M.D.   On: 06/22/2013 21:06    PHYSICAL EXAM  patient is oriented to person time and place. Affect is normal. There is some blood in her pacemaker bandage. The EP team will reassess her wound. Lungs are  clear. Respiratory effort is nonlabored. Cardiac exam reveals S1 and S2. Abdomen is soft. There is no peripheral edema.   TELEMETRY: I have reviewed telemetry today June 26, 2013. She has sinus rhythm. She is tracking and pacing.   ASSESSMENT AND PLAN:    Mobitz (type) I (Wenckebach's) atrioventricular block /   complete heart block      This has now been treated with a permanent pacemaker.    Mitral regurgitation     This is being followed clinically    CKD (chronic kidney disease), stage III      Renal function stable today postop    Orthostatic hypotension      This is not an active issue as of today    Shortness of breath      Pulmonary evaluation suggests no definite proof of significant lung disease at this time. They feel it is likely that her shortness of breath is a combination of her cardiac issues and deconditioning. The recommendation is for outpatient followup pulmonary function studies. The plan will be to continue to see how she does clinically and arrange outpatient pulmonary followup only if significant pulmonary function abnormality. There is mild pulmonary hypertension at the time of right heart cath in the cath lab. Wedge pressure was 11.     Patient is received a pacemaker. Hopefully she will feel significantly better over time. She feels dramatically better at this time.     There is significant coronary disease. At this point we are not convinced that ischemia is causing her problems. However she has further problems or symptoms intervention including possible CABG might be needed.    Fatigue     I suspect this is multifactorial. There could be a component of anxiety in addition.    CAD (coronary artery disease)      Significant coronary disease by catheterization. No chest pain. I may consider followup outpatient nuclear study to see if there is significant ischemia without significant pain.    Pacemaker     Her new pacemaker is in place in stable.  The  patient feels dramatically better. I am hopeful that his rate is related to the pacemaker placemen  Patient's pacemaker site will be checked. Assuming that it is stable she will be allowed to be discharged home. I will see her back for general cardiology followup in the [facility] office. We will arrange for her post pacemaker care also.   Willa Rough 06/26/2013 8:15 AM

## 2013-06-26 NOTE — Discharge Summary (Signed)
Patient ID: Becky Collins,  MRN: [id number], DOB/AGE: Jun 26, 1932 79 y.o.  Admit date: 06/21/2013 Discharge date: 06/26/2013  Primary Care Provider: Selinda Flavin Primary Cardiologist: Lovena Neighbours, MD / G. Ladona Ridgel, MD (EP)  Discharge Diagnoses Principal Problem:   Shortness of breath/Hypoxia  Active Problems:   Fatigue   Complete heart block  **s/p Medtronic dual chamber PPM this admission.   Mobitz (type) I (Wenckebach's) atrioventricular block   Pacemaker   DM (diabetes mellitus)   Moderate Mitral regurgitation   CKD (chronic kidney disease), stage III   Orthostatic hypotension   CAD (coronary artery disease)  **Three vessel CAD by cath this admission.   HTN (hypertension)   HLD (hyperlipidemia)  Allergies No Known Allergies  Procedures  Cardiac Catheterization 9.15.2014  Procedural Findings:  Hemodynamics:                                     RA 9                                    RV 41/6                                     PA 37/12 Mean 20                                     PCWP 11                                     LV NA                                     AO 140/49              Oxygen saturations:                                     PA 51                                     AO 100              Cardiac Output (Fick) 3.62                               Cardiac Index (Fick) 1.85   Coronary angiography:  Coronary dominance: Right  Left mainstem:   Distal 25%.   Left anterior descending (LAD): Heavily calcified.  Mid LAD 70% stenosis after mid diagonal.  Mid diagonal large ostial 25%.   Left circumflex (LCx): AV groove 95% after OM1, OM1 small with high grade diffuse proximal stenosis.  Distal 80% stenosis before later PL. PL is moderate sized and normal.   Right coronary artery (RCA): 100% ostial stenosis.  Left to right collateral flow predominantly from the LAD.  Left ventriculography: LV not injected.  Final Conclusions:  Native 3 vessel CAD.  This appears to  be chronic.  Right heart pressures are not elevated.   _____________   Permanent Pacemaker Placement 9.15.2014  Medtronic Adapta L dual-chamber pacemaker, serial number [id number]. _____________   History of Present Illness  79 year old female with history of progressive fatigue and Mobitz one heart block who was recently hospitalized at [facility] secondary to progressive fatigue. An echocardiogram in August of this year showed normal LV function. She was reviewed by electrophysiology in the absence of high-grade heart block or significant bradycardia, it was not felt that she required permanent pacing. She was discharged to rehabilitation secondary to deconditioning and subsequently discharged home. She presented to [facility] on September 10 with recurrent fatigue, weakness, and edema. Chest x-ray showed no evidence of CHF and V:Q scan was negative for pulmonary embolus. She was tachycardic and hypoxic with ambulation and as a result cardiology was consulted. Decision was made to transfer to [facility] for further cardiac evaluation.  Hospital Course  Following arrival, patient continued to have fatigue and dyspnea exertion. Arrangements were made for right and left heart cardiac catheterization and pulmonology consultation was also obtained. Pulmonology felt that there was no obvious pulmonary etiology for her symptoms but that pulmonary function testing should be undertaken.  Following admission, patient remained in a Mobitz one heart block. However, on September 15, just prior to diagnostic catheterization, she was noted to be in complete heart block with rates in the mid 30s. Catheterization was performed revealing severe multivessel coronary artery disease as outlined above and immediately following catheterization, electrophysiology was consulted and agreed that patient would benefit from placement of a permanent pacemaker. She then underwent successful placement of a Medtronic Adapta  L dual-chamber permanent pacemaker on September 15. Following permanent pacemaker placement, Becky Collins reported significant clinical improvement with resolution of dyspnea and fatigue. Postoperative chest x-ray was stable without evidence of pneumothorax. She has worked with physical therapy and they have recommended home health physical therapy for supervision of mobility.  With regards to the patient's severe coronary artery disease, she has not had any chest pain. It is felt that her disease is likely chronic in nature. We'll consider outpatient ischemic evaluation to determine whether or not she will require percutaneous intervention or surgical intervention in the future. We plan to discharge her home today in good condition. We have placed a pressure dressing over her pacemaker site secondary to a small amount of oozing noted. She has early followup in pacemaker clinic scheduled for next week.   Discharge Vitals Blood pressure 132/71, pulse 80, temperature 97.3 F (36.3 C), temperature source Axillary, resp. rate 18, height 5\' 3"  (1.6 m), weight 193 lb 4.8 oz (87.68 kg), SpO2 98.00%.  Filed Weights   06/22/13 0300 06/23/13 0626 06/26/13 0516  Weight: 201 lb 12.7 oz (91.532 kg) 202 lb 3.2 oz (91.717 kg) 193 lb 4.8 oz (87.68 kg)   Labs  CBC  Recent Labs  06/23/13 2032  WBC 5.7  HGB 10.8*  HCT 35.1*  MCV 82.4  PLT 255   Basic Metabolic Panel  Recent Labs  06/24/13 0959 06/26/13 0445  NA 132* 137  K 4.2 4.1  CL 99 102  CO2 23 23  GLUCOSE 202* 220*  BUN 21 17  CREATININE 1.53* 1.53*  CALCIUM 9.9 10.0   Thyroid Function Tests Lab Results  Component Value Date   TSH 2.048 06/23/2013    Disposition  Pt is being discharged home today in  good condition.  Follow-up Plans & Appointments  Follow-up Information   Follow up with EDMISTEN, BROOKE, PA-C On 07/05/2013. (12:00 PM)    Specialty:  Cardiology   Contact information:   [address] [place]  [address] [phone]       Follow up with Willa Rough, MD On 07/26/2013. (2:45 PM)    Specialty:  Cardiology   Contact information:   [address] [place], [place] [address]       Follow up with Lewayne Bunting, MD On 09/26/2013. (9:30 AM)    Specialty:  Cardiology   Contact information:   [address] [place] [address] [phone]      Discharge Medications    Medication List    STOP taking these medications       furosemide 40 MG tablet  Commonly known as:  LASIX      TAKE these medications       amLODipine 5 MG tablet  Commonly known as:  NORVASC  Take 1 tablet (5 mg total) by mouth daily.     aspirin 81 MG tablet  Take 81 mg by mouth at bedtime.     brimonidine 0.2 % ophthalmic solution  Commonly known as:  ALPHAGAN  Place 1 drop into both eyes 2 (two) times daily.     calcium-vitamin D 500-200 MG-UNIT per tablet  Commonly known as:  OSCAL WITH D  Take 1 tablet by mouth.     gabapentin 100 MG capsule  Commonly known as:  NEURONTIN  Take 7 capsules (700 mg total) by mouth 2 (two) times daily.     glimepiride 2 MG tablet  Commonly known as:  AMARYL  Take 2 mg by mouth 4 (four) times daily.     insulin aspart 100 UNIT/ML injection  Commonly known as:  novoLOG  Sliding Scale     insulin glargine 100 UNIT/ML injection  Commonly known as:  LANTUS  Inject 0.2 mL (20 Units total) into the skin at bedtime.     LORazepam 0.5 MG tablet  Commonly known as:  ATIVAN  Take 1 tablet (0.5 mg total) by mouth daily as needed. For anxiety     MULTIPLE VITAMIN PO  Take by mouth daily.     omeprazole 20 MG capsule  Commonly known as:  PRILOSEC  Take 20 mg by mouth every morning.     sertraline 100 MG tablet  Commonly known as:  ZOLOFT  Take 100 mg by mouth every morning.     Vitamin D-3 1000 UNITS Caps  Take by mouth daily.       Outstanding Labs/Studies  None  Duration of Discharge Encounter   Greater than 30 minutes including physician  time.  Signed, Nicolasa Ducking NP 06/26/2013, 12:25 PM  Patient seen and examined. I agree with the assessment and plan as detailed above. See also my additional thoughts below.   The patient is greatly improved with her pacemaker. She is very appreciative of the care. Refer to my progress note from today. I made the decision for her to go home. I agree with the discharge note above.  Willa Rough, MD, [facility] 06/27/2013 4:50 PM

## 2013-06-27 ENCOUNTER — Other Ambulatory Visit: Payer: Self-pay

## 2013-06-27 MED ORDER — AMLODIPINE BESYLATE 5 MG PO TABS: 5.0000 mg | ORAL_TABLET | Freq: Every day | ORAL | Status: DC

## 2013-07-01 ENCOUNTER — Telehealth: Payer: Self-pay

## 2013-07-01 NOTE — Telephone Encounter (Signed)
No order needed.  She should remove the dressing and leave open to air with steri-strips intact.  HHN aware and she will have the site assessed on 07/05/13 at her post hospital visit

## 2013-07-01 NOTE — Telephone Encounter (Signed)
Adv. Home Health Nurse needs order for dressing changes for Pacer site. Please call Nurse at [phone].

## 2013-07-05 ENCOUNTER — Ambulatory Visit (INDEPENDENT_AMBULATORY_CARE_PROVIDER_SITE_OTHER): Payer: Medicare Other | Admitting: Cardiology

## 2013-07-05 ENCOUNTER — Encounter: Payer: Self-pay | Admitting: Cardiology

## 2013-07-05 VITALS — BP 154/86 | HR 93 | Ht 63.0 in | Wt 191.1 lb

## 2013-07-05 DIAGNOSIS — Z950 Presence of cardiac pacemaker: Secondary | ICD-10-CM

## 2013-07-05 DIAGNOSIS — I2510 Atherosclerotic heart disease of native coronary artery without angina pectoris: Secondary | ICD-10-CM

## 2013-07-05 DIAGNOSIS — I442 Atrioventricular block, complete: Secondary | ICD-10-CM

## 2013-07-05 DIAGNOSIS — I10 Essential (primary) hypertension: Secondary | ICD-10-CM

## 2013-07-05 MED ORDER — METOPROLOL TARTRATE 25 MG PO TABS: 25.0000 mg | ORAL_TABLET | Freq: Two times a day (BID) | ORAL | Status: AC

## 2013-07-05 NOTE — Progress Notes (Signed)
ELECTROPHYSIOLOGY OFFICE NOTE  Patient ID: Becky Collins MRN: [id number], DOB/AGE: Jun 05, 1932   Date of Visit: 07/05/2013  Primary Physician: Selinda Flavin, MD Primary Cardiologist: Myrtis Ser, MD / Ladona Ridgel, MD Reason for Visit: Hospital follow-up  History of Present Illness  Becky Collins is a 79 y.o. female with HTN, DM, CKD and dyslipidemia who was recently admitted with progressive fatigue, weakness and hypoxemia. She was found to have symptomatic high grade AV block (2:1 and CHB) and underwent PPM implant. She also has severe multivessel CAD which was found during her hospitalization. This is felt to be chronic, not requiring revascularization / PCI, and medical management was recommended. She presents today for hospital follow-up. She is accompanied by her granddaughter.   Since discharge, she reports she continues to feel better. Her DOE has improved. She states she is very grateful for her care / pacemaker. She denies chest pain or shortness of breath. She denies palpitations, dizziness, near syncope or syncope. She denies LE swelling, orthopnea, PND or recent weight gain. She is compliant with her medications. She denies any warmth, drainage, redness or pain at her implant site. She denies fever or chills.  Past Medical History Past Medical History  Diagnosis Date  . Hypertension     moderate left ventricular hypertrophy  . Diabetes mellitus     h/o negative exercise stress test/normal LVF assessed June 2011 by echo EF 60-65%  . Hyperlipidemia   . Carotid artery disease     with less than 60% stenosis on the right in 2006  . History of seizure disorder     on depakote  . History of recurrent UTIs   . Chronic edema   . Depression   . Acid reflux disease   . History of medication noncompliance     this has been ongoing for quite sometime  . Complete heart block     a. previously documented Mobitz I 11/12;  b. 06/2013 Found to have CHB--> MDT Adapta L dc PPM, ser #: [id number].  .  Stroke     .  Marland Kitchen Moderate mitral regurgitation     a. 05/2013 Echo: EF 55-65%, no rwma, Mod MR.  . CKD (chronic kidney disease), stage III   . Orthostatic hypotension   . Paroxysmal atrial tachycardia   . Normal Ejection Fraction     a. 05/2013 Echo: EF 55-65%.  Marland Kitchen CAD (coronary artery disease)     a. 06/2013 Cath: LM 25, LAD 63m, Diag 25 ost, LCX 70m, 80d, OM1 small, sev diff prox dzs, LPL mod, nl, RCA 100ost 2/ L->R collats.    Past Surgical History Past Surgical History  Procedure Laterality Date  . Cesarean section  1973    Allergies/Intolerances No Known Allergies  Current Home Medications Current Outpatient Prescriptions  Medication Sig Dispense Refill  . amLODipine (NORVASC) 5 MG tablet Take 1 tablet (5 mg total) by mouth daily.  30 tablet  6  . aspirin 81 MG tablet Take 81 mg by mouth at bedtime.       . brimonidine (ALPHAGAN) 0.2 % ophthalmic solution Place 1 drop into both eyes 2 (two) times daily.      . calcium-vitamin D (OSCAL WITH D) 500-200 MG-UNIT per tablet Take 1 tablet by mouth.      . Cholecalciferol (VITAMIN D-3) 1000 UNITS CAPS Take by mouth daily.      Marland Kitchen gabapentin (NEURONTIN) 100 MG capsule Take 7 capsules (700 mg total) by mouth 2 (two) times daily.      Marland Kitchen  glimepiride (AMARYL) 2 MG tablet Take 2 mg by mouth 4 (four) times daily.      . insulin glargine (LANTUS) 100 UNIT/ML injection Inject 0.2 mL (20 Units total) into the skin at bedtime.  10 mL  12  . LORazepam (ATIVAN) 0.5 MG tablet Take 1 tablet (0.5 mg total) by mouth daily as needed. For anxiety  15 tablet  0  . MULTIPLE VITAMIN PO Take by mouth daily.      Marland Kitchen omeprazole (PRILOSEC) 20 MG capsule Take 20 mg by mouth every morning.       . sertraline (ZOLOFT) 100 MG tablet Take 100 mg by mouth every morning.      . metoprolol tartrate (LOPRESSOR) 25 MG tablet Take 1 tablet (25 mg total) by mouth 2 (two) times daily.  60 tablet  3   No current facility-administered medications for this visit.    Social  History Social History  . Marital Status: Widowed   Social History Main Topics  . Smoking status: Never Smoker   . Smokeless tobacco: Never Used  . Alcohol Use: No  . Drug Use: No   Social History Narrative   Husband died mid 2011-08-02.    Review of Systems General: No chills, fever, night sweats or weight changes Cardiovascular: No chest pain, dyspnea on exertion, edema, orthopnea, palpitations, paroxysmal nocturnal dyspnea Dermatological: No rash, lesions or masses Respiratory: No cough, dyspnea Urologic: No hematuria, dysuria Abdominal: No nausea, vomiting, diarrhea, bright red blood per rectum, melena, or hematemesis Neurologic: No visual changes, weakness, changes in mental status All other systems reviewed and are otherwise negative except as noted above.  Physical Exam Vitals: Blood pressure 154/86, pulse 93, height 5\' 3"  (1.6 m), weight 191 lb 1.9 oz (86.691 kg).  General: Well developed, well appearing 79 y.o. female in no acute distress. HEENT: Normocephalic, atraumatic. EOMs intact. Sclera nonicteric. Oropharynx clear.  Neck: Supple. No JVD. Lungs: Respirations regular and unlabored, CTA bilaterally. No wheezes, rales or rhonchi. Heart: RRR. S1, S2 present. No murmurs, rub, S3 or S4. Abdomen: Soft, non-distended.  Extremities: No clubbing, cyanosis or edema. PT/Radials 2+ and equal bilaterally. Psych: Normal affect. Neuro: Alert and oriented X 3. Moves all extremities spontaneously. Skin: Left upper chest / implant site is intact and appears well healing. Tiny area of eschar / ? protruding stitch in center of incision.There is no erythema, warmth, drainage, bleeding, edema, tenderness or hematoma.    Diagnostics Cardiac Catheterization 9.15.2014  Procedural Findings:  Hemodynamics:  RA 9  RV 41/6  PA 37/12 Mean 20  PCWP 11  LV NA  AO 140/49 Oxygen saturations:  PA 51  AO 100  Cardiac Output (Fick) 3.62 Cardiac Index (Fick) 1.85  Coronary angiography:    Coronary dominance: Right Left mainstem: Distal 25%.  Left anterior descending (LAD): Heavily calcified. Mid LAD 70% stenosis after mid diagonal. Mid diagonal large ostial 25%.  Left circumflex (LCx): AV groove 95% after OM1, OM1 small with high grade diffuse proximal stenosis. Distal 80% stenosis before later PL. PL is moderate sized and normal.  Right coronary artery (RCA): 100% ostial stenosis. Left to right collateral flow predominantly from the LAD.  Left ventriculography: LV not injected.  Final Conclusions: Native 3 vessel CAD. This appears to be chronic. Right heart pressures are not elevated.   Echocardiogram 8.12.2014 Study Conclusions - Left ventricle: The cavity size was normal. Wall thickness was normal. Systolic function was normal. The estimated ejection fraction was in the range of 55% to 60%.  Wall motion was normal; there were no regional wall motion abnormalities. Left ventricular diastolic function parameters were normal. - Mitral valve: Moderate regurgitation. Impression: - Patient is in NSR with 2:1 block throughout the study.  Device interrogation today - Normal PPM function with good battery status and stable lead measurements; V paced 99.5% to time; 0 mode switches; 0 VHR episodes; battery longevity 10.5 years; no programming changes made: see PaceArt report  Assessment and Plan 1. CAD - significant 3-vessel disease by recent cath; felt to be chronic; medical management recommended - stable without anginal symptoms - continue ASA and will add metoprolol today - DOE has improved with pacing - follow-up with Dr. Myrtis Ser as scheduled 07/26/2013 2. High grade AV block s/p PPM implant - wound appears to be well healing - normal PPM function  - no programming changes made - follow-up wound check in 1 week - follow-up with Dr. Ladona Ridgel in 3 months 3. HTN - BP elevated today - history of orthostatic hypotension - will add metoprolol as outlined  above  Signed, Delos Klich, PA-C 07/05/2013, 1:38 PM

## 2013-07-05 NOTE — Patient Instructions (Addendum)
Your physician recommends that you schedule a follow-up appointment in: DR. Ladona Ridgel LATE December IN THE [place] OFFICE   Your physician recommends that you schedule a follow-up appointment in: IN THE [place] OFFICE [address]  July 16, 2013 10:20 AM FOR A WOUND CHECK  Your physician recommends that you schedule a follow-up appointment in: DR. KATZ July 26, 2013 AT 2:45 PM IN THE [place] OFFICE   Your physician recommends that you continue on your current medications as directed. Please refer to the Current Medication list given to you today.   Your physician has recommended you make the following change in your medication:   START METOPROLOL LOPRESSOR 25 MG TWICE A DAY (TWELVE HOURS APART)

## 2013-07-09 LAB — PACEMAKER DEVICE OBSERVATION
AL AMPLITUDE: 2 mv
AL THRESHOLD: 0.75 V
ATRIAL PACING PM: 0.2
BAMS-0001: 150 {beats}/min
RV LEAD IMPEDENCE PM: 704 Ohm
RV LEAD THRESHOLD: 0.5 V
VENTRICULAR PACING PM: 99.7

## 2013-07-16 ENCOUNTER — Ambulatory Visit (INDEPENDENT_AMBULATORY_CARE_PROVIDER_SITE_OTHER): Payer: Medicare Other | Admitting: *Deleted

## 2013-07-16 DIAGNOSIS — I442 Atrioventricular block, complete: Secondary | ICD-10-CM

## 2013-07-16 LAB — PACEMAKER DEVICE OBSERVATION
AL AMPLITUDE: 1 mv
BAMS-0001: 150 {beats}/min
BATTERY VOLTAGE: 2.79 V
RV LEAD AMPLITUDE: 11.2 mv
RV LEAD IMPEDENCE PM: 691 Ohm
VENTRICULAR PACING PM: 100

## 2013-07-16 NOTE — Progress Notes (Signed)
Wound check-PPM in office. 

## 2013-07-19 ENCOUNTER — Inpatient Hospital Stay (HOSPITAL_COMMUNITY)
Admission: AD | Admit: 2013-07-19 | Discharge: 2013-07-27 | DRG: 280 | Disposition: A | Discharge status: Home Health | Payer: Medicare Other | Source: Other Acute Inpatient Hospital | Attending: Cardiology | Admitting: Cardiology

## 2013-07-19 ENCOUNTER — Telehealth: Payer: Self-pay | Admitting: Internal Medicine

## 2013-07-19 ENCOUNTER — Encounter (HOSPITAL_COMMUNITY): Payer: Self-pay | Admitting: *Deleted

## 2013-07-19 DIAGNOSIS — E785 Hyperlipidemia, unspecified: Secondary | ICD-10-CM | POA: Diagnosis present

## 2013-07-19 DIAGNOSIS — IMO0001 Reserved for inherently not codable concepts without codable children: Secondary | ICD-10-CM | POA: Diagnosis present

## 2013-07-19 DIAGNOSIS — I442 Atrioventricular block, complete: Secondary | ICD-10-CM | POA: Diagnosis present

## 2013-07-19 DIAGNOSIS — Z950 Presence of cardiac pacemaker: Secondary | ICD-10-CM

## 2013-07-19 DIAGNOSIS — D649 Anemia, unspecified: Secondary | ICD-10-CM | POA: Diagnosis present

## 2013-07-19 DIAGNOSIS — R9430 Abnormal result of cardiovascular function study, unspecified: Secondary | ICD-10-CM | POA: Diagnosis present

## 2013-07-19 DIAGNOSIS — E119 Type 2 diabetes mellitus without complications: Secondary | ICD-10-CM

## 2013-07-19 DIAGNOSIS — G319 Degenerative disease of nervous system, unspecified: Secondary | ICD-10-CM | POA: Diagnosis present

## 2013-07-19 DIAGNOSIS — F329 Major depressive disorder, single episode, unspecified: Secondary | ICD-10-CM | POA: Diagnosis present

## 2013-07-19 DIAGNOSIS — I129 Hypertensive chronic kidney disease with stage 1 through stage 4 chronic kidney disease, or unspecified chronic kidney disease: Secondary | ICD-10-CM | POA: Diagnosis present

## 2013-07-19 DIAGNOSIS — I059 Rheumatic mitral valve disease, unspecified: Secondary | ICD-10-CM | POA: Diagnosis present

## 2013-07-19 DIAGNOSIS — I6529 Occlusion and stenosis of unspecified carotid artery: Secondary | ICD-10-CM | POA: Diagnosis present

## 2013-07-19 DIAGNOSIS — K219 Gastro-esophageal reflux disease without esophagitis: Secondary | ICD-10-CM | POA: Diagnosis present

## 2013-07-19 DIAGNOSIS — R4182 Altered mental status, unspecified: Secondary | ICD-10-CM | POA: Diagnosis present

## 2013-07-19 DIAGNOSIS — F3289 Other specified depressive episodes: Secondary | ICD-10-CM | POA: Diagnosis present

## 2013-07-19 DIAGNOSIS — Z7982 Long term (current) use of aspirin: Secondary | ICD-10-CM

## 2013-07-19 DIAGNOSIS — F99 Mental disorder, not otherwise specified: Secondary | ICD-10-CM | POA: Diagnosis present

## 2013-07-19 DIAGNOSIS — I340 Nonrheumatic mitral (valve) insufficiency: Secondary | ICD-10-CM | POA: Diagnosis present

## 2013-07-19 DIAGNOSIS — G40909 Epilepsy, unspecified, not intractable, without status epilepticus: Secondary | ICD-10-CM | POA: Diagnosis present

## 2013-07-19 DIAGNOSIS — I70219 Atherosclerosis of native arteries of extremities with intermittent claudication, unspecified extremity: Secondary | ICD-10-CM

## 2013-07-19 DIAGNOSIS — Z794 Long term (current) use of insulin: Secondary | ICD-10-CM

## 2013-07-19 DIAGNOSIS — N390 Urinary tract infection, site not specified: Secondary | ICD-10-CM | POA: Diagnosis present

## 2013-07-19 DIAGNOSIS — Z8673 Personal history of transient ischemic attack (TIA), and cerebral infarction without residual deficits: Secondary | ICD-10-CM

## 2013-07-19 DIAGNOSIS — K5900 Constipation, unspecified: Secondary | ICD-10-CM | POA: Diagnosis not present

## 2013-07-19 DIAGNOSIS — T40605A Adverse effect of unspecified narcotics, initial encounter: Secondary | ICD-10-CM | POA: Diagnosis present

## 2013-07-19 DIAGNOSIS — I10 Essential (primary) hypertension: Secondary | ICD-10-CM

## 2013-07-19 DIAGNOSIS — Z8669 Personal history of other diseases of the nervous system and sense organs: Secondary | ICD-10-CM

## 2013-07-19 DIAGNOSIS — E1165 Type 2 diabetes mellitus with hyperglycemia: Secondary | ICD-10-CM

## 2013-07-19 DIAGNOSIS — N183 Chronic kidney disease, stage 3 unspecified: Secondary | ICD-10-CM | POA: Diagnosis present

## 2013-07-19 DIAGNOSIS — Z79899 Other long term (current) drug therapy: Secondary | ICD-10-CM

## 2013-07-19 DIAGNOSIS — I214 Non-ST elevation (NSTEMI) myocardial infarction: Secondary | ICD-10-CM

## 2013-07-19 DIAGNOSIS — I2510 Atherosclerotic heart disease of native coronary artery without angina pectoris: Secondary | ICD-10-CM | POA: Diagnosis present

## 2013-07-19 DIAGNOSIS — G9340 Encephalopathy, unspecified: Secondary | ICD-10-CM | POA: Diagnosis not present

## 2013-07-19 LAB — COMPREHENSIVE METABOLIC PANEL
ALT: 20 U/L (ref 0–35)
AST: 30 U/L (ref 0–37)
Albumin: 3.2 g/dL — ABNORMAL LOW (ref 3.5–5.2)
CO2: 25 mEq/L (ref 19–32)
Calcium: 9 mg/dL (ref 8.4–10.5)
GFR calc non Af Amer: 37 mL/min — ABNORMAL LOW (ref >90)
Glucose, Bld: 95 mg/dL (ref 70–99)
Potassium: 3.8 mEq/L (ref 3.5–5.1)
Sodium: 141 mEq/L (ref 135–145)

## 2013-07-19 MED ORDER — SERTRALINE HCL 100 MG PO TABS
100.0000 mg | ORAL_TABLET | Freq: Every morning | ORAL | Status: DC
  Administered 2013-07-20 – 2013-07-27 (×8): 100 mg via ORAL
  Filled 2013-07-19 (×8): qty 1

## 2013-07-19 MED ORDER — ASPIRIN 81 MG PO CHEW
324.0000 mg | CHEWABLE_TABLET | ORAL | Status: AC
  Administered 2013-07-19: 324 mg via ORAL
  Filled 2013-07-19: qty 4

## 2013-07-19 MED ORDER — INSULIN ASPART 100 UNIT/ML ~~LOC~~ SOLN
0.0000 [IU] | Freq: Three times a day (TID) | SUBCUTANEOUS | Status: DC
  Administered 2013-07-20 – 2013-07-21 (×2): 3 [IU] via SUBCUTANEOUS
  Administered 2013-07-21: 5 [IU] via SUBCUTANEOUS
  Administered 2013-07-22: 12:00:00 via SUBCUTANEOUS
  Administered 2013-07-22: 8 [IU] via SUBCUTANEOUS
  Administered 2013-07-22 – 2013-07-23 (×2): 5 [IU] via SUBCUTANEOUS
  Administered 2013-07-23 (×2): 3 [IU] via SUBCUTANEOUS
  Administered 2013-07-24: 2 [IU] via SUBCUTANEOUS
  Administered 2013-07-24: 5 [IU] via SUBCUTANEOUS
  Administered 2013-07-24 – 2013-07-25 (×3): 3 [IU] via SUBCUTANEOUS
  Administered 2013-07-26: 2 [IU] via SUBCUTANEOUS
  Administered 2013-07-26 – 2013-07-27 (×2): 3 [IU] via SUBCUTANEOUS

## 2013-07-19 MED ORDER — LORAZEPAM 0.5 MG PO TABS
0.5000 mg | ORAL_TABLET | Freq: Every day | ORAL | Status: DC | PRN
  Administered 2013-07-19: 0.5 mg via ORAL
  Filled 2013-07-19: qty 1

## 2013-07-19 MED ORDER — ASPIRIN EC 81 MG PO TBEC
81.0000 mg | DELAYED_RELEASE_TABLET | Freq: Every day | ORAL | Status: DC
  Administered 2013-07-20 – 2013-07-27 (×8): 81 mg via ORAL
  Filled 2013-07-19 (×8): qty 1

## 2013-07-19 MED ORDER — INSULIN GLARGINE 100 UNIT/ML ~~LOC~~ SOLN
40.0000 [IU] | Freq: Every day | SUBCUTANEOUS | Status: DC
  Administered 2013-07-20 – 2013-07-21 (×2): 40 [IU] via SUBCUTANEOUS
  Filled 2013-07-19 (×3): qty 0.4

## 2013-07-19 MED ORDER — ACETAMINOPHEN 325 MG PO TABS
650.0000 mg | ORAL_TABLET | ORAL | Status: DC | PRN
  Administered 2013-07-21 – 2013-07-24 (×4): 650 mg via ORAL
  Filled 2013-07-19 (×4): qty 2

## 2013-07-19 MED ORDER — SIMVASTATIN 40 MG PO TABS
40.0000 mg | ORAL_TABLET | Freq: Every day | ORAL | Status: DC
  Filled 2013-07-19: qty 1

## 2013-07-19 MED ORDER — GABAPENTIN 400 MG PO CAPS
700.0000 mg | ORAL_CAPSULE | Freq: Two times a day (BID) | ORAL | Status: DC
  Administered 2013-07-19 – 2013-07-20 (×2): 700 mg via ORAL
  Filled 2013-07-19 (×3): qty 1

## 2013-07-19 MED ORDER — NITROGLYCERIN IN D5W 200-5 MCG/ML-% IV SOLN
3.0000 ug/min | INTRAVENOUS | Status: DC
  Administered 2013-07-19: 5 ug/min via INTRAVENOUS
  Administered 2013-07-22: 40 ug/min via INTRAVENOUS
  Filled 2013-07-19 (×4): qty 250

## 2013-07-19 MED ORDER — BRIMONIDINE TARTRATE 0.2 % OP SOLN
1.0000 [drp] | Freq: Two times a day (BID) | OPHTHALMIC | Status: DC
  Administered 2013-07-19 – 2013-07-27 (×15): 1 [drp] via OPHTHALMIC
  Filled 2013-07-19: qty 5

## 2013-07-19 MED ORDER — PANTOPRAZOLE SODIUM 40 MG PO TBEC
40.0000 mg | DELAYED_RELEASE_TABLET | Freq: Every day | ORAL | Status: DC
  Administered 2013-07-20 – 2013-07-27 (×8): 40 mg via ORAL
  Filled 2013-07-19 (×8): qty 1

## 2013-07-19 MED ORDER — METOPROLOL TARTRATE 25 MG PO TABS
25.0000 mg | ORAL_TABLET | Freq: Two times a day (BID) | ORAL | Status: DC
  Administered 2013-07-20 – 2013-07-27 (×15): 25 mg via ORAL
  Filled 2013-07-19 (×16): qty 1

## 2013-07-19 MED ORDER — PNEUMOCOCCAL VAC POLYVALENT 25 MCG/0.5ML IJ INJ
0.5000 mL | INJECTION | INTRAMUSCULAR | Status: AC
  Administered 2013-07-20: 0.5 mL via INTRAMUSCULAR
  Filled 2013-07-19: qty 0.5

## 2013-07-19 MED ORDER — ONDANSETRON HCL 4 MG/2ML IJ SOLN: 4.0000 mg | Freq: Four times a day (QID) | INTRAMUSCULAR | Status: DC | PRN

## 2013-07-19 MED ORDER — AMLODIPINE BESYLATE 5 MG PO TABS
5.0000 mg | ORAL_TABLET | Freq: Every day | ORAL | Status: DC
  Administered 2013-07-20 – 2013-07-27 (×8): 5 mg via ORAL
  Filled 2013-07-19 (×8): qty 1

## 2013-07-19 MED ORDER — SODIUM CHLORIDE 0.9 % IV SOLN
INTRAVENOUS | Status: DC | PRN
  Administered 2013-07-19: 21:00:00 via INTRAVENOUS

## 2013-07-19 MED ORDER — HEPARIN BOLUS VIA INFUSION
4000.0000 [IU] | Freq: Once | INTRAVENOUS | Status: AC
  Administered 2013-07-19: 4000 [IU] via INTRAVENOUS
  Filled 2013-07-19: qty 4000

## 2013-07-19 MED ORDER — ASPIRIN 300 MG RE SUPP
300.0000 mg | RECTAL | Status: AC
  Filled 2013-07-19: qty 1

## 2013-07-19 MED ORDER — HEPARIN (PORCINE) IN NACL 100-0.45 UNIT/ML-% IJ SOLN
1100.0000 [IU]/h | INTRAMUSCULAR | Status: DC
  Administered 2013-07-19 – 2013-07-22 (×3): 1000 [IU]/h via INTRAVENOUS
  Filled 2013-07-19 (×9): qty 250

## 2013-07-19 MED ORDER — BIOTENE DRY MOUTH MT LIQD
15.0000 mL | Freq: Two times a day (BID) | OROMUCOSAL | Status: DC
  Administered 2013-07-20 – 2013-07-27 (×12): 15 mL via OROMUCOSAL

## 2013-07-19 NOTE — Telephone Encounter (Addendum)
I called the pt, who was notably SOB over the phone, and advised her to have someone drive her to the closest ER or to call 911 as advised by the nurse and NP from our [place] office. She refused stating that she did not want to leave her home and that the hospital might want to keep her all night and she does not want that.  I asked to speak to her daughter Becky Collins, who lives in the home. I explained all of above and advised Becky Collins that pt needs to go to the ER and she stated that she would call 911.

## 2013-07-19 NOTE — Progress Notes (Signed)
ANTICOAGULATION CONSULT NOTE - Initial Consult  Pharmacy Consult for Heparin Indication: chest pain/ACS  No Known Allergies  Patient Measurements: Height: 5\' 3"  (160 cm) Weight: 190 lb 14.7 oz (86.6 kg) IBW/kg (Calculated) : 52.4  Vital Signs: Temp: 98.4 F (36.9 C) (10/10 2200) Temp src: Oral (10/10 2200) BP: 97/46 mmHg (10/10 2230) Pulse Rate: 72 (10/10 2230)  Labs: Estimated Creatinine Clearance: 31.1 ml/min (by C-G formula based on Cr of 1.53).  Medical History: Past Medical History  Diagnosis Date  . Hypertension     moderate left ventricular hypertrophy  . Diabetes mellitus     h/o negative exercise stress test/normal LVF assessed June 2011 by echo EF 60-65%  . Hyperlipidemia   . Carotid artery disease     with less than 60% stenosis on the right in 2006  . History of seizure disorder     on depakote  . History of recurrent UTIs   . Chronic edema   . Depression   . Acid reflux disease   . History of medication noncompliance     this has been ongoing for quite sometime  . Complete heart block     a. previously documented Mobitz I 11/12;  b. 06/2013 Found to have CHB--> MDT Adapta L dc PPM, ser #: [id number].  . Stroke     .  Marland Kitchen Moderate mitral regurgitation     a. 05/2013 Echo: EF 55-65%, no rwma, Mod MR.  . CKD (chronic kidney disease), stage III   . Orthostatic hypotension   . Paroxysmal atrial tachycardia   . Normal Ejection Fraction     a. 05/2013 Echo: EF 55-65%.  Marland Kitchen CAD (coronary artery disease)     a. 06/2013 Cath: LM 25, LAD 104m, Diag 25 ost, LCX 71m, 80d, OM1 small, sev diff prox dzs, LPL mod, nl, RCA 100ost 2/ L->R collats.   Medications:  Infusions:  . nitroGLYCERIN     Assessment: 79 yo female admitted with chest pain.  She has significant past medical history but no noted bleeding.  I spoke with her nurse who states that she has IV access and we have been asked to initiate her IV heparin therapy.    Goal of Therapy:  Heparin level 0.3-0.7  units/ml Monitor platelets by anticoagulation protocol: Yes   Plan:  1.  Begin IV heparin 4000 units IV bolus and then drip at 1000 units/hr 2.  Will check a 6 hour heparin level 3.  Monitor for bleeding complications.  Nadara Mustard, PharmD., MS Clinical Pharmacist Pager:  [phone] Thank you for allowing pharmacy to be part of this patients care team. 07/19/2013,10:40 PM

## 2013-07-19 NOTE — Telephone Encounter (Signed)
States that patient is having SOB on minimal exertion / OT just wanted to let someone at the office know in case patient needed to be contacted / tgs

## 2013-07-19 NOTE — Telephone Encounter (Signed)
Pt advised this started yesterday, notes tightness in her chest last night off and on for 20 minutes all night, pt noted SOB with her OT walking this am, pt notes swelling around her pacemaker that was placed a week ago (noted implanted 06-24-13) spoke with Lorin Picket NP, noted her BP was elevated this am however unable to note the exact numbers and was given verbal instructions to have the pt go to the ED, pt advised her daughter is with her, this nurse advised the daughter the pt needs to be taken to the ED for evaluation however the pt advised she does not have a car for the daughter to take her plus she had a recent stroke and is not allowed to drive, pt notes no one will be around to take her until this evening and pt refused for EMS to take her via ambulance, reiterated with the pt the importance of being evaluated per also noted audible SOB and pt advised she will rest up and feel better later on, noted daughter unable to come to the phone at this time, pt advised again she will lay down to feel better and call 911 if she does not see some relief soon, noted pt last OV with Dr Myrtis Ser 06-03-13, spoke to Dr Myrtis Ser nurse Larita Fife, advised situation, she will also try to contact the pt and message sent to Dr Myrtis Ser and Larita Fife for [facility]

## 2013-07-19 NOTE — H&P (Signed)
History and Physical  Patient ID: Becky Collins MRN: [id number], SOB: 1932-04-01 79 y.o. Date of Encounter: 07/19/2013, 10:21 PM  Primary Physician: Selinda Flavin, MD Primary Cardiologist: Myrtis Ser  Chief Complaint: shortness of breath  HPI: 79 y.o. female w/ PMHx significant for CAD, DM2, HTN, s/p recent pacer who presented to [facility] initially and transferred to  [facility] on 07/19/2013 with complaints of 2 days of shortness of breath and feeling poorly. Symptoms started last night after she walked to her mothers house approximately 100 yards away. Felt very short of breath with chest pain and left shoulder pain. Associated with abdominal discomfort, nausea but no vomiting.  In transport, pt received aspirin, SL nitro and morphine (?8 mg). She is currently very drowsy and though she answers questions appropriately, she is difficult to follow and her history is limited. Still with 3/10 chest pain.  Outside labs ALT 24 AST 32 TB 0.6 BNP 318 Troponin 0.22 K 4.5 Hct 34 Cr 1.4  Cxray read as no acute changes. EKG is a sense, v paced.  Pt was recently admitted for fatigue and SOB and found to have 3 vessel coronary disease, plan to medically manage. Also with high degree AVB and pacer placed with reported improvement in her symptoms.   Past Medical History  Diagnosis Date  . Hypertension     moderate left ventricular hypertrophy  . Diabetes mellitus     h/o negative exercise stress test/normal LVF assessed June 2011 by echo EF 60-65%  . Hyperlipidemia   . Carotid artery disease     with less than 60% stenosis on the right in 2006  . History of seizure disorder     on depakote  . History of recurrent UTIs   . Chronic edema   . Depression   . Acid reflux disease   . History of medication noncompliance     this has been ongoing for quite sometime  . Complete heart block     a. previously documented Mobitz I 11/12;  b. 06/2013 Found to have CHB--> MDT Adapta L dc PPM, ser #:  [id number].  . Stroke     .  Marland Kitchen Moderate mitral regurgitation     a. 05/2013 Echo: EF 55-65%, no rwma, Mod MR.  . CKD (chronic kidney disease), stage III   . Orthostatic hypotension   . Paroxysmal atrial tachycardia   . Normal Ejection Fraction     a. 05/2013 Echo: EF 55-65%.  Marland Kitchen CAD (coronary artery disease)     a. 06/2013 Cath: LM 25, LAD 33m, Diag 25 ost, LCX 45m, 80d, OM1 small, sev diff prox dzs, LPL mod, nl, RCA 100ost 2/ L->R collats.     Surgical History:  Past Surgical History  Procedure Laterality Date  . Cesarean section  1973     Home Meds: Prior to Admission medications   Medication Sig Start Date End Date Taking? Authorizing Provider  amLODipine (NORVASC) 5 MG tablet Take 1 tablet (5 mg total) by mouth daily. 06/27/13   Rollene Rotunda, MD  aspirin 81 MG tablet Take 81 mg by mouth at bedtime.     Historical Provider, MD  brimonidine (ALPHAGAN) 0.2 % ophthalmic solution Place 1 drop into both eyes 2 (two) times daily.    Historical Provider, MD  calcium-vitamin D (OSCAL WITH D) 500-200 MG-UNIT per tablet Take 1 tablet by mouth.    Historical Provider, MD  Cholecalciferol (VITAMIN D-3) 1000 UNITS CAPS Take by mouth daily.    Historical Provider,  MD  gabapentin (NEURONTIN) 100 MG capsule Take 7 capsules (700 mg total) by mouth 2 (two) times daily. 05/27/13   Shanker Levora Dredge, MD  glimepiride (AMARYL) 2 MG tablet Take 2 mg by mouth 4 (four) times daily. 05/09/13   Historical Provider, MD  insulin glargine (LANTUS) 100 UNIT/ML injection Inject 40 Units into the skin 2 (two) times daily. 05/27/13   Shanker Levora Dredge, MD  LORazepam (ATIVAN) 0.5 MG tablet Take 1 tablet (0.5 mg total) by mouth daily as needed. For anxiety 05/27/13   Maretta Bees, MD  metoprolol tartrate (LOPRESSOR) 25 MG tablet Take 1 tablet (25 mg total) by mouth 2 (two) times daily. 07/05/13   Brooke O Edmisten, PA-C  MULTIPLE VITAMIN PO Take by mouth daily.    Historical Provider, MD  omeprazole (PRILOSEC) 20 MG  capsule Take 20 mg by mouth every morning.     Historical Provider, MD  sertraline (ZOLOFT) 100 MG tablet Take 100 mg by mouth every morning.    Historical Provider, MD    Allergies: No Known Allergies  History   Social History  . Marital Status: Widowed    Spouse Name: N/A    Number of Children: N/A  . Years of Education: N/A   Occupational History  . Not on file.   Social History Main Topics  . Smoking status: Never Smoker   . Smokeless tobacco: Never Used  . Alcohol Use: No  . Drug Use: No  . Sexual Activity: Not on file   Other Topics Concern  . Not on file   Social History Narrative   Husband died recently mid 07-22-2011.     Family History  Problem Relation Age of Onset  . Heart attack Father   . Heart attack Mother   . Diabetes Sister   . Hypertension Sister   . Peripheral vascular disease Sister   . Diabetes Brother   . Heart disease Brother   . Hypertension Brother     Review of Systems: Limited due to altered mental status. See HPI for pertinent positives and negatives.  Labs:   Lab Results  Component Value Date   WBC 5.7 06/23/2013   HGB 10.8* 06/23/2013   HCT 35.1* 06/23/2013   MCV 82.4 06/23/2013   PLT 255 06/23/2013   No results found for this basename: NA, K, CL, CO2, BUN, CREATININE, CALCIUM, LABALBU, PROT, BILITOT, ALKPHOS, ALT, AST, GLUCOSE,  in the last 168 hours No results found for this basename: CKTOTAL, CKMB, TROPONINI,  in the last 72 hours No results found for this basename: CHOL, HDL, LDLCALC, TRIG   No results found for this basename: DDIMER     EKG: a sense, v pace  Physical Exam: Blood pressure 149/117, pulse 79, temperature 98.4 F (36.9 C), temperature source Oral, height 5\' 3"  (1.6 m), weight 86.6 kg (190 lb 14.7 oz), SpO2 96.00%. General: Well developed, well nourished, in no acute distress. Head: Normocephalic, atraumatic, sclera non-icteric, nares are without discharge Neck: Supple. Negative for carotid bruits. JVD  not elevated. Lungs: Clear bilaterally to auscultation without wheezes, rales, or rhonchi. Breathing is unlabored. Heart: RRR with S1 S2. No murmurs, rubs, or gallops appreciated. Pacer incision well healed. No tenderness Abdomen: Soft, non-tender, non-distended with normoactive bowel sounds. No rebound/guarding. No obvious abdominal masses. Msk:  Strength and tone appear normal for age. Extremities: Trace edema. No clubbing or cyanosis. Distal pedal pulses are 2+ and equal bilaterally. Neuro: Oriented but drowsy (morphine). Moving all extremities. Psych:  Responds to questions appropriately.   Problem List 1. NSTEMI 2. Known 3 vessel CAD 3. HTN 4. DM2 5. Hyperlipidemia, ?not on a statin 6. High degree AVB s/p pacer 7. CKD 8. AMS due to morphine  ASSESSMENT AND PLAN:  79 y.o. female w/ PMHx significant for CAD, DM2, HTN, s/p recent pacer who presented to [facility] initially and transferred to  [facility] on 07/19/2013 with complaints of 2 days of shortness of breath and feeling poorly. Given her history of known severe CAD, her symptoms in combination with elevated biomarkers are concerning for NSTEMI.  Will aggressively managed her symptoms with nitro gtt to achieve pain free. Continue aspirin, beta blocker. Due to elevated TIMI score, add heparin.  Unclear why she is not on a statin though perhaps when she is clearer, she can recall. Add statin for pleotropic benefits in ACS.  Continue home diabetes regimen at reduced insulin doses. Sliding scale to cover.  Continue PPI.  Full code.  Signed, Adolm Joseph, Signe Tackitt C. MD 07/19/2013, 10:21 PM

## 2013-07-20 ENCOUNTER — Inpatient Hospital Stay (HOSPITAL_COMMUNITY): Payer: Medicare Other

## 2013-07-20 LAB — CBC
Hemoglobin: 10.6 g/dL — ABNORMAL LOW (ref 12.0–15.0)
MCHC: 31.5 g/dL (ref 30.0–36.0)
RBC: 4.2 MIL/uL (ref 3.87–5.11)

## 2013-07-20 LAB — URINALYSIS, ROUTINE W REFLEX MICROSCOPIC
Glucose, UA: 100 mg/dL — AB
Ketones, ur: NEGATIVE mg/dL
Nitrite: NEGATIVE
Protein, ur: NEGATIVE mg/dL
Specific Gravity, Urine: 1.008 (ref 1.005–1.030)
Urobilinogen, UA: 0.2 mg/dL (ref 0.0–1.0)

## 2013-07-20 LAB — BASIC METABOLIC PANEL
BUN: 22 mg/dL (ref 6–23)
Chloride: 105 mEq/L (ref 96–112)
GFR calc Af Amer: 37 mL/min — ABNORMAL LOW (ref >90)
GFR calc non Af Amer: 32 mL/min — ABNORMAL LOW (ref >90)
Potassium: 3.7 mEq/L (ref 3.5–5.1)
Sodium: 140 mEq/L (ref 135–145)

## 2013-07-20 LAB — TROPONIN I
Troponin I: 0.39 ng/mL (ref <0.30)
Troponin I: <0.3 ng/mL (ref <0.30)
Troponin I: <0.3 ng/mL (ref <0.30)

## 2013-07-20 LAB — GLUCOSE, CAPILLARY
Glucose-Capillary: 174 mg/dL — ABNORMAL HIGH (ref 70–99)
Glucose-Capillary: 72 mg/dL (ref 70–99)
Glucose-Capillary: 97 mg/dL (ref 70–99)

## 2013-07-20 LAB — PRO B NATRIURETIC PEPTIDE: Pro B Natriuretic peptide (BNP): 1110 pg/mL — ABNORMAL HIGH (ref 0–450)

## 2013-07-20 LAB — URINE MICROSCOPIC-ADD ON

## 2013-07-20 LAB — MRSA PCR SCREENING: MRSA by PCR: NEGATIVE

## 2013-07-20 MED ORDER — ATORVASTATIN CALCIUM 20 MG PO TABS
20.0000 mg | ORAL_TABLET | Freq: Every day | ORAL | Status: DC
  Administered 2013-07-20 – 2013-07-26 (×7): 20 mg via ORAL
  Filled 2013-07-20 (×8): qty 1

## 2013-07-20 MED ORDER — SODIUM CHLORIDE 0.45 % IV SOLN
INTRAVENOUS | Status: DC
  Administered 2013-07-20: 16:00:00 via INTRAVENOUS

## 2013-07-20 NOTE — Progress Notes (Signed)
CRITICAL VALUE ALERT  Critical value received:  Troponin=.39  Date of notification:  07/20/2013  Time of notification:  0020  Critical value read back:yes  Nurse who received alert:  G.Mayford Knife RN  MD notified (1st page):  Dr Adolm Joseph  Time of first page:  0035  MD notified (2nd page):  Time of second page:  Responding MD:  Dr Adolm Joseph  Time MD responded:  [phone]

## 2013-07-20 NOTE — Progress Notes (Signed)
ANTICOAGULATION CONSULT NOTE - Follow Up Consult  Pharmacy Consult for heparin Indication: NSTEMI  Labs:  Recent Labs  07/19/13 2310 07/20/13 0600  HGB  --  10.6*  HCT  --  33.6*  PLT  --  256  HEPARINUNFRC  --  0.65  CREATININE 1.34*  --   TROPONINI 0.39*  --     Assessment/Plan:  79yo female therapeutic on heparin with initial dosing for NSTEMI.  Will continue gtt at current rate and confirm stable with additional level.  Vernard Gambles, PharmD, BCPS  07/20/2013,6:46 AM

## 2013-07-20 NOTE — Progress Notes (Signed)
SUBJECTIVE:  Mild chest pain but not nearly as severe as previous.   PHYSICAL EXAM Filed Vitals:   07/20/13 0600 07/20/13 0700 07/20/13 0756 07/20/13 0800  BP: 99/56 105/59  119/54  Pulse: 72 71  71  Temp:   97.5 F (36.4 C) 97.5 F (36.4 C)  TempSrc:   Oral Oral  Resp: 9 7  11   Height:      Weight:      SpO2: 100% 99%  100%   General:  No acute distress Lungs:  Clear Heart:  RRR Abdomen:  Positive bowel sounds, no rebound no guarding Extremities:  No edema Chest:  Pacer site OK. Neuro:  Nofocal  LABS: Lab Results  Component Value Date   TROPONINI <0.30 07/20/2013   Results for orders placed during the hospital encounter of 07/19/13 (from the past 24 hour(s))  TROPONIN I     Status: Abnormal   Collection Time    07/19/13 11:10 PM      Result Value Range   Troponin I 0.39 (*) <0.30 ng/mL  PRO B NATRIURETIC PEPTIDE     Status: Abnormal   Collection Time    07/19/13 11:10 PM      Result Value Range   Pro B Natriuretic peptide (BNP) 1110.0 (*) 0 - 450 pg/mL  COMPREHENSIVE METABOLIC PANEL     Status: Abnormal   Collection Time    07/19/13 11:10 PM      Result Value Range   Sodium 141  135 - 145 mEq/L   Potassium 3.8  3.5 - 5.1 mEq/L   Chloride 105  96 - 112 mEq/L   CO2 25  19 - 32 mEq/L   Glucose, Bld 95  70 - 99 mg/dL   BUN 21  6 - 23 mg/dL   Creatinine, Ser 1.61 (*) 0.50 - 1.10 mg/dL   Calcium 9.0  8.4 - 09.6 mg/dL   Total Protein 6.6  6.0 - 8.3 g/dL   Albumin 3.2 (*) 3.5 - 5.2 g/dL   AST 30  0 - 37 U/L   ALT 20  0 - 35 U/L   Alkaline Phosphatase 67  39 - 117 U/L   Total Bilirubin 0.3  0.3 - 1.2 mg/dL   GFR calc non Af Amer 37 (*) >90 mL/min   GFR calc Af Amer 42 (*) >90 mL/min  GLUCOSE, CAPILLARY     Status: None   Collection Time    07/19/13 11:56 PM      Result Value Range   Glucose-Capillary 97  70 - 99 mg/dL   Comment 1 Documented in Chart     Comment 2 Notify RN    MRSA PCR SCREENING     Status: None   Collection Time    07/20/13  4:16 AM       Result Value Range   MRSA by PCR NEGATIVE  NEGATIVE  TROPONIN I     Status: None   Collection Time    07/20/13  6:00 AM      Result Value Range   Troponin I <0.30  <0.30 ng/mL  BASIC METABOLIC PANEL     Status: Abnormal   Collection Time    07/20/13  6:00 AM      Result Value Range   Sodium 140  135 - 145 mEq/L   Potassium 3.7  3.5 - 5.1 mEq/L   Chloride 105  96 - 112 mEq/L   CO2 23  19 - 32 mEq/L   Glucose,  Bld 73  70 - 99 mg/dL   BUN 22  6 - 23 mg/dL   Creatinine, Ser 2.95 (*) 0.50 - 1.10 mg/dL   Calcium 8.9  8.4 - 28.4 mg/dL   GFR calc non Af Amer 32 (*) >90 mL/min   GFR calc Af Amer 37 (*) >90 mL/min  CBC     Status: Abnormal   Collection Time    07/20/13  6:00 AM      Result Value Range   WBC 7.5  4.0 - 10.5 K/uL   RBC 4.20  3.87 - 5.11 MIL/uL   Hemoglobin 10.6 (*) 12.0 - 15.0 g/dL   HCT 13.2 (*) 44.0 - 10.2 %   MCV 80.0  78.0 - 100.0 fL   MCH 25.2 (*) 26.0 - 34.0 pg   MCHC 31.5  30.0 - 36.0 g/dL   RDW 72.5  36.6 - 44.0 %   Platelets 256  150 - 400 K/uL  HEPARIN LEVEL (UNFRACTIONATED)     Status: None   Collection Time    07/20/13  6:00 AM      Result Value Range   Heparin Unfractionated 0.65  0.30 - 0.70 IU/mL  GLUCOSE, CAPILLARY     Status: None   Collection Time    07/20/13  7:57 AM      Result Value Range   Glucose-Capillary 72  70 - 99 mg/dL    Intake/Output Summary (Last 24 hours) at 07/20/13 1041 Last data filed at 07/20/13 1000  Gross per 24 hour  Intake 369.02 ml  Output      0 ml  Net 369.02 ml    EKG:  NSR, rate 74, 100% pacing.  07/20/2013  ASSESSMENT AND PLAN:  NSTEMI:   Some mild residual pain.  The pain is consistent with [place].  I reviewed the cath films today.  The circ lesion, although calcified, is treatable.  I will review with the interventionalist on Monday but I would suggest PCI.  Continue NTG and heparin today.    DM:  Continue current therapy.   HTN:  BP Ok.  Continue current therapy.   PACEMAKER:  Status post  Medtronic Adapt L pacemaker last month.  Normal function.   CKD:   Creat stable.  We will hydrate before PCI.   Becky Collins [facility] 07/20/2013 10:41 AM

## 2013-07-20 NOTE — Progress Notes (Signed)
Called by RN, Re: pt slightly more drowsy and confused, with questionable RLE weakness. Her vital signs are stable and had poor intake and lethargic throughout day. The RLE weakness not reported during previous shifts.   Patient is on Heparin gtt for NSTEMI, had AMS yesterday but thought to be 2/2 meds.  -- CT head w/o, no MRI due to recent pacer implantation. -- Hold Ativan and Gabapentin -- Urine and blood infection surveillance.  -- Close monitor   Haydee Salter, MD

## 2013-07-20 NOTE — Progress Notes (Signed)
Called by RN re: no UOP in > 8 hours.   Pt PO intake is poor today, she is lethargic and BP running low. No chest pain. Renal function at baseline. Pt likely needs cath Monday.  Will add 1/2 NS at 50 cc/hr. RN to do bladder scan and call if > 300 cc seen. Otherwise, continue to follow.

## 2013-07-21 LAB — BASIC METABOLIC PANEL
CO2: 22 mEq/L (ref 19–32)
Calcium: 8.3 mg/dL — ABNORMAL LOW (ref 8.4–10.5)
Chloride: 98 mEq/L (ref 96–112)
GFR calc Af Amer: 39 mL/min — ABNORMAL LOW (ref >90)
GFR calc non Af Amer: 34 mL/min — ABNORMAL LOW (ref >90)
Glucose, Bld: 233 mg/dL — ABNORMAL HIGH (ref 70–99)
Potassium: 4.4 mEq/L (ref 3.5–5.1)
Sodium: 131 mEq/L — ABNORMAL LOW (ref 135–145)

## 2013-07-21 LAB — GLUCOSE, CAPILLARY
Glucose-Capillary: 226 mg/dL — ABNORMAL HIGH (ref 70–99)
Glucose-Capillary: 217 mg/dL — ABNORMAL HIGH (ref 70–99)
Glucose-Capillary: 287 mg/dL — ABNORMAL HIGH (ref 70–99)
Glucose-Capillary: 181 mg/dL — ABNORMAL HIGH (ref 70–99)
Glucose-Capillary: 236 mg/dL — ABNORMAL HIGH (ref 70–99)

## 2013-07-21 LAB — TROPONIN I: Troponin I: <0.3 ng/mL (ref <0.30)

## 2013-07-21 MED ORDER — MORPHINE SULFATE 2 MG/ML IJ SOLN
2.0000 mg | INTRAMUSCULAR | Status: DC | PRN
  Administered 2013-07-24: 2 mg via INTRAVENOUS
  Filled 2013-07-21: qty 1

## 2013-07-21 MED ORDER — LORAZEPAM 0.5 MG PO TABS
0.5000 mg | ORAL_TABLET | Freq: Every day | ORAL | Status: DC | PRN
  Administered 2013-07-25 – 2013-07-26 (×2): 0.5 mg via ORAL
  Filled 2013-07-21 (×2): qty 1

## 2013-07-21 MED ORDER — MORPHINE SULFATE 2 MG/ML IJ SOLN
INTRAMUSCULAR | Status: AC
  Administered 2013-07-21: 2 mg via INTRAVENOUS
  Filled 2013-07-21: qty 1

## 2013-07-21 MED ORDER — MORPHINE SULFATE 2 MG/ML IJ SOLN
2.0000 mg | Freq: Once | INTRAMUSCULAR | Status: AC
  Administered 2013-07-21: 2 mg via INTRAVENOUS

## 2013-07-21 MED ORDER — MORPHINE SULFATE 2 MG/ML IJ SOLN
INTRAMUSCULAR | Status: AC
  Filled 2013-07-21: qty 1

## 2013-07-21 NOTE — Progress Notes (Signed)
SUBJECTIVE:  AMS last night as noted in the nurses notes. However, she and her son report that she is at baseline this AM and she is oriented.  She is not complaining of acute pain or SOB.    PHYSICAL EXAM Filed Vitals:   07/21/13 0400 07/21/13 0500 07/21/13 0600 07/21/13 0700  BP: 118/45  140/63 128/52  Pulse: 77 76 80   Temp: 98.8 F (37.1 C)   98.6 F (37 C)  TempSrc: Oral   Oral  Resp: 18 19 17    Height:      Weight:      SpO2: 100% 100% 100%    General:  No acute distress Lungs:  Bilateral basilar crackles.  Heart:  RRR Abdomen:  Positive bowel sounds, no rebound no guarding Extremities:  No edema Chest:  Pacer site OK. Neuro:  Nofocal  LABS: Lab Results  Component Value Date   TROPONINI <0.30 07/20/2013   Results for orders placed during the hospital encounter of 07/19/13 (from the past 24 hour(s))  TROPONIN I     Status: None   Collection Time    07/20/13 12:44 PM      Result Value Range   Troponin I <0.30  <0.30 ng/mL  HEPARIN LEVEL (UNFRACTIONATED)     Status: None   Collection Time    07/20/13 12:44 PM      Result Value Range   Heparin Unfractionated 0.56  0.30 - 0.70 IU/mL  GLUCOSE, CAPILLARY     Status: Abnormal   Collection Time    07/20/13  5:25 PM      Result Value Range   Glucose-Capillary 174 (*) 70 - 99 mg/dL  GLUCOSE, CAPILLARY     Status: Abnormal   Collection Time    07/20/13  8:39 PM      Result Value Range   Glucose-Capillary 226 (*) 70 - 99 mg/dL  URINALYSIS, ROUTINE W REFLEX MICROSCOPIC     Status: Abnormal   Collection Time    07/20/13  9:27 PM      Result Value Range   Color, Urine YELLOW  YELLOW   APPearance CLOUDY (*) CLEAR   Specific Gravity, Urine 1.008  1.005 - 1.030   pH 5.0  5.0 - 8.0   Glucose, UA 100 (*) NEGATIVE mg/dL   Hgb urine dipstick NEGATIVE  NEGATIVE   Bilirubin Urine NEGATIVE  NEGATIVE   Ketones, ur NEGATIVE  NEGATIVE mg/dL   Protein, ur NEGATIVE  NEGATIVE mg/dL   Urobilinogen, UA 0.2  0.0 - 1.0 mg/dL     Nitrite NEGATIVE  NEGATIVE   Leukocytes, UA MODERATE (*) NEGATIVE  URINE MICROSCOPIC-ADD ON     Status: Abnormal   Collection Time    07/20/13  9:27 PM      Result Value Range   Squamous Epithelial / LPF FEW (*) RARE   WBC, UA 11-20  <3 WBC/hpf   RBC / HPF 0-2  <3 RBC/hpf   Bacteria, UA FEW (*) RARE  BASIC METABOLIC PANEL     Status: Abnormal   Collection Time    07/21/13  3:48 AM      Result Value Range   Sodium 131 (*) 135 - 145 mEq/L   Potassium 4.4  3.5 - 5.1 mEq/L   Chloride 98  96 - 112 mEq/L   CO2 22  19 - 32 mEq/L   Glucose, Bld 233 (*) 70 - 99 mg/dL   BUN 20  6 - 23 mg/dL   Creatinine,  Ser 1.44 (*) 0.50 - 1.10 mg/dL   Calcium 8.3 (*) 8.4 - 10.5 mg/dL   GFR calc non Af Amer 34 (*) >90 mL/min   GFR calc Af Amer 39 (*) >90 mL/min  GLUCOSE, CAPILLARY     Status: Abnormal   Collection Time    07/21/13  7:42 AM      Result Value Range   Glucose-Capillary 217 (*) 70 - 99 mg/dL    Intake/Output Summary (Last 24 hours) at 07/21/13 0902 Last data filed at 07/21/13 0700  Gross per 24 hour  Intake 2304.8 ml  Output   2100 ml  Net  204.8 ml   CT HEAD:  1. No acute intracranial process.  2. Unchanged remote right parietal infarct.  3. Mild age related atrophy and chronic microvascular ischemic  disease, similar to prior.  ASSESSMENT AND PLAN:  NSTEMI:   No further pain.  The pain on admission was consistent with [place].  I reviewed the cath films yesterday.  The circ lesion, although calcified, is treatable.  I will review with the interventionalist on Monday but I would suggest PCI.  Continue NTG and heparin today.   I am not inclined to hydrate extensively today as she has crackles on exam.  (She is not on the cath board.)  AMS:  No acute neurologic process evident.  Na is low which could contribute.  She seems to be nonfocal today.   DM:  Continue current therapy.   HTN:  BP has been low.  Continue current therapy.   PACEMAKER:  Status post Medtronic Adapt L  pacemaker last month.  Normal function.   CKD:   Creat stable.  See above.   UA:  Slightly abnormal.  Check a culture.   Fayrene Fearing [facility] 07/21/2013 9:02 AM

## 2013-07-21 NOTE — Progress Notes (Signed)
Pt moaning, clutching chest, complains of midsternal pain radiating down left arm unable to rate due to severe pain; O2 3L , titrating NTG gtt up for pain control; EKG completed; MD aware: orders received from Hochrein; Morphine given; will continue to closely monitor

## 2013-07-21 NOTE — Progress Notes (Signed)
PHARMACY FOLLOW UP NOTE   Pharmacy Consult for : Heparin Indication: chest pain/ACS  Dosing Weight: 73 kg  Labs:  Recent Labs  07/19/13 2310 07/20/13 0600 07/20/13 1244 07/21/13 0348  HGB  --  10.6*  --   --   HCT  --  33.6*  --   --   PLT  --  256  --   --   HEPARINUNFRC  --  0.65 0.56  --   CREATININE 1.34* 1.49*  --  1.44*   Estimated Creatinine Clearance: 33.7 ml/min (by C-G formula based on Cr of 1.44).  Pertinent Medications:  Infusions:  . sodium chloride 10 mL/hr at 07/21/13 0800  . sodium chloride Stopped (07/20/13 1600)  . heparin 1,000 Units/hr (07/21/13 0800)  . nitroGLYCERIN 5 mcg/min (07/21/13 0800)    Assessment:  79 y/o female on Heparin infusion for NSTEMI.  Heparin rate 1000 units/hr.  Heparin level therapeutic 0.56 units/ml.  No bleeding complications noted   Goal:  Heparin level 0.3-0.7 units/ml   Plan: 1. Continue Heparin at current rate. 2. Follow up cath plans. 3. Daily Heparin Levels, CBC.  Monitor for bleeding complications    Rayford Halsted.D 07/21/2013, 12:33 PM

## 2013-07-21 NOTE — Progress Notes (Signed)
Pt with returning chest pain with movement in bed; md at bedside; orders received; morphine given; will continue to monitor

## 2013-07-22 ENCOUNTER — Inpatient Hospital Stay (HOSPITAL_COMMUNITY): Payer: Medicare Other

## 2013-07-22 DIAGNOSIS — G9340 Encephalopathy, unspecified: Secondary | ICD-10-CM

## 2013-07-22 DIAGNOSIS — R4182 Altered mental status, unspecified: Secondary | ICD-10-CM

## 2013-07-22 DIAGNOSIS — F99 Mental disorder, not otherwise specified: Secondary | ICD-10-CM | POA: Diagnosis present

## 2013-07-22 DIAGNOSIS — Z8669 Personal history of other diseases of the nervous system and sense organs: Secondary | ICD-10-CM

## 2013-07-22 DIAGNOSIS — N390 Urinary tract infection, site not specified: Secondary | ICD-10-CM

## 2013-07-22 DIAGNOSIS — I214 Non-ST elevation (NSTEMI) myocardial infarction: Secondary | ICD-10-CM

## 2013-07-22 DIAGNOSIS — I059 Rheumatic mitral valve disease, unspecified: Secondary | ICD-10-CM

## 2013-07-22 LAB — URINALYSIS, ROUTINE W REFLEX MICROSCOPIC
Bilirubin Urine: NEGATIVE
Glucose, UA: 250 mg/dL — AB
Ketones, ur: NEGATIVE mg/dL
Nitrite: NEGATIVE
Protein, ur: NEGATIVE mg/dL
Urobilinogen, UA: 1 mg/dL (ref 0.0–1.0)
pH: 6 (ref 5.0–8.0)

## 2013-07-22 LAB — GLUCOSE, CAPILLARY
Glucose-Capillary: 128 mg/dL — ABNORMAL HIGH (ref 70–99)
Glucose-Capillary: 211 mg/dL — ABNORMAL HIGH (ref 70–99)
Glucose-Capillary: 213 mg/dL — ABNORMAL HIGH (ref 70–99)

## 2013-07-22 LAB — TROPONIN I
Troponin I: <0.3 ng/mL (ref <0.30)
Troponin I: <0.3 ng/mL (ref <0.30)
Troponin I: <0.3 ng/mL (ref <0.30)

## 2013-07-22 LAB — BASIC METABOLIC PANEL
Calcium: 8.3 mg/dL — ABNORMAL LOW (ref 8.4–10.5)
Chloride: 99 mEq/L (ref 96–112)
GFR calc Af Amer: 43 mL/min — ABNORMAL LOW (ref >90)
Potassium: 4.2 mEq/L (ref 3.5–5.1)

## 2013-07-22 LAB — HEPARIN LEVEL (UNFRACTIONATED): Heparin Unfractionated: 0.31 IU/mL (ref 0.30–0.70)

## 2013-07-22 LAB — OSMOLALITY: Osmolality: 290 mOsm/kg (ref 275–300)

## 2013-07-22 LAB — URINE MICROSCOPIC-ADD ON

## 2013-07-22 LAB — CREATININE, URINE, RANDOM: Creatinine, Urine: 31.14 mg/dL

## 2013-07-22 MED ORDER — PERFLUTREN LIPID MICROSPHERE: 1.0000 mL | INTRAVENOUS | Status: AC | PRN

## 2013-07-22 MED ORDER — PERFLUTREN LIPID MICROSPHERE
INTRAVENOUS | Status: AC
  Administered 2013-07-22: 2 mL
  Filled 2013-07-22: qty 10

## 2013-07-22 MED ORDER — DEXTROSE 5 % IV SOLN
1.0000 g | INTRAVENOUS | Status: DC
  Administered 2013-07-22 – 2013-07-26 (×5): 1 g via INTRAVENOUS
  Filled 2013-07-22 (×6): qty 10

## 2013-07-22 MED ORDER — INSULIN ASPART 100 UNIT/ML ~~LOC~~ SOLN
3.0000 [IU] | Freq: Three times a day (TID) | SUBCUTANEOUS | Status: DC
  Administered 2013-07-22 – 2013-07-24 (×6): 3 [IU] via SUBCUTANEOUS

## 2013-07-22 MED ORDER — INSULIN GLARGINE 100 UNIT/ML ~~LOC~~ SOLN
45.0000 [IU] | Freq: Every day | SUBCUTANEOUS | Status: DC
Start: 2013-07-22 — End: 2013-07-23
  Administered 2013-07-22: 45 [IU] via SUBCUTANEOUS
  Filled 2013-07-22 (×3): qty 0.45

## 2013-07-22 NOTE — Progress Notes (Signed)
Becky Collins informed of positive urinalysis via phone. Currently on Rocephin. No new orders at this time.

## 2013-07-22 NOTE — Progress Notes (Addendum)
Patient ID: Becky Collins, female   DOB: 03/26/1932, 79 y.o.   MRN: [id number]  SUBJECTIVE: The patient had pain when getting up in bed last night. The patient's first troponin was 0.39. All of the other troponins have been normal since then.  This morning she is confused. She has renal dysfunction, but her current creatinine of 1.32 is actually good for her. Renal function should not preclude further cardiac testing.  Dr. Sw[place] has reviewed the films. The circumflex can be approached with PCI. However  it will be a complex procedure with multiple stents. In addition there are not good images of her right coronary artery from the original cath. There was difficulty engaging the right. The patient was in complete heart block on the table at that time. The overall situation is difficult. Historically this patient is very difficult to assess. She needs to be observed further. If she has unrelenting chest pain that we think is ischemic, she should undergo PCI. However, this is not yet proven. We need to watch her carefully.   Filed Vitals:   07/22/13 0400 07/22/13 0500 07/22/13 0600 07/22/13 0700  BP: 128/65 119/68 127/75 116/65  Pulse: 83 84 81 82  Temp: 98 F (36.7 C)   98.4 F (36.9 C)  TempSrc: Oral   Oral  Resp: 22 21 18 24   Height:      Weight:      SpO2: 96% 96% 97% 99%    Intake/Output Summary (Last 24 hours) at 07/22/13 4098 Last data filed at 07/22/13 0800  Gross per 24 hour  Intake 1644.9 ml  Output   1425 ml  Net  219.9 ml    LABS: Basic Metabolic Panel:  Recent Labs  11/91/47 0348 07/22/13 0215  NA 131* 130*  K 4.4 4.2  CL 98 99  CO2 22 23  GLUCOSE 233* 246*  BUN 20 18  CREATININE 1.44* 1.32*  CALCIUM 8.3* 8.3*   Liver Function Tests:  Recent Labs  07/19/13 2310  AST 30  ALT 20  ALKPHOS 67  BILITOT 0.3  PROT 6.6  ALBUMIN 3.2*   No results found for this basename: LIPASE, AMYLASE,  in the last 72 hours CBC:  Recent Labs  07/20/13 0600  WBC 7.5  HGB  10.6*  HCT 33.6*  MCV 80.0  PLT 256   Cardiac Enzymes:  Recent Labs  07/21/13 1230 07/21/13 2027 07/22/13 0215  TROPONINI <0.30 <0.30 <0.30   BNP: No components found with this basename: POCBNP,  D-Dimer: No results found for this basename: DDIMER,  in the last 72 hours Hemoglobin A1C: No results found for this basename: HGBA1C,  in the last 72 hours Fasting Lipid Panel: No results found for this basename: CHOL, HDL, LDLCALC, TRIG, CHOLHDL, LDLDIRECT,  in the last 72 hours Thyroid Function Tests: No results found for this basename: TSH, T4TOTAL, FREET3, T3FREE, THYROIDAB,  in the last 72 hours  RADIOLOGY: Dg Chest 2 View  06/25/2013   *RADIOLOGY REPORT*  Clinical Data: Status post pacemaker insertion  CHEST - 2 VIEW  Comparison: 06/22/2013  Findings: Cardiac shadow is stable. A pacing device is now noted. No pneumothorax is seen. The lungs are clear bilaterally.  No acute bony abnormality is seen.  IMPRESSION: No acute abnormality noted following pacemaker placement.   Original Report Authenticated By: Alcide Clever, M.D.   Dg Chest 2 View  06/22/2013   CLINICAL DATA:  79 year old female shortness of Breath.  EXAM: CHEST  2 VIEW  COMPARISON:  06/19/2013 and earlier.  FINDINGS: Upright AP and lateral views. Lower lung volumes. Stable cardiomegaly and mediastinal contours. Visualized tracheal air column is within normal limits. No pneumothorax, pulmonary edema, pleural effusion or confluent pulmonary opacity.  IMPRESSION: Lower lung volumes, otherwise no acute cardiopulmonary abnormality.   Electronically Signed   By: Augusto Gamble M.D.   On: 06/22/2013 16:41   Ct Head Wo Contrast  07/20/2013   *RADIOLOGY REPORT*  Clinical Data: Confusion  CT HEAD WITHOUT CONTRAST  Technique:  Contiguous axial images were obtained from the base of the skull through the vertex without contrast.  Comparison: Prior MRI from 08/16/2011  Findings: Mild prominence of the CSF containing spaces was compatible with  generalized atrophy. Scattered and confluent hypodensities within the periventricular deep white matter most compatible with chronic microvascular ischemic disease.  Remote right parietal infarct is noted, unchanged as compared to prior MRI.  No acute intracranial hemorrhage is identified.  There is no large vessel territory infarct.  No extra-axial fluid collection. No midline shift or mass lesion.  Calvarium is intact.  Orbital soft tissues are within normal limits.  Paranasal sinuses and mastoid air cells are clear.  IMPRESSION: 1. No acute intracranial process.  2. Unchanged remote right parietal infarct.  3. Mild age related atrophy and chronic microvascular ischemic disease, similar to prior.   Original Report Authenticated By: Rise Mu, M.D.   Dg Abd Portable 1v  06/22/2013   CLINICAL DATA:  Left lower quadrant pain.  EXAM: PORTABLE ABDOMEN - 1 VIEW  COMPARISON:  None.  FINDINGS: The bowel gas pattern is normal. No radio-opaque calculi or other significant radiographic abnormality are seen.  IMPRESSION: No evidence of bowel obstruction or free air.   Electronically Signed   By: Charlett Nose M.D.   On: 06/22/2013 21:06    PHYSICAL EXAM   Patient is not oriented. She has some mild abdominal discomfort. There is no jugulovenous distention. Lungs are clear. Respiratory effort is nonlabored. Cardiac exam reveals S1 and S2. There no clicks or significant murmurs. Her abdomen is soft.   TELEMETRY:    I have reviewed telemetry today July 22, 2013. The rhythm is paced.  ASSESSMENT AND PLAN:    NSTEMI (non-ST elevated myocardial infarction)    At this point it is not completely clear that she has had an ischemic event. Her first troponin was 0.39. All other troponins have been normal despite ongoing pain. We will continue to follow her carefully to try to decide the most appropriate approach. In the first set ounces of this note above, I have outlined some of the issues. She is confused  today. We will continue to monitor her mental status and her chest pain and her troponins and her renal function. We may in fact proceed with PCI to the circumflex. However, this will be a complex procedure.    HTN (hypertension)   DM (diabetes mellitus)    Ejection fraction    Ejection fraction was normal and she was assessed recently. With her current situation however, we need followup echo data.    Mitral regurgitation     Patient had moderate mitral regurgitation with her recent echo before her pacemaker. She needs followup echo.    History of seizure disorder      Patient is confused today. She's not had any clinical seizures.    CKD (chronic kidney disease), stage III     Creatinine today is 1.32. This is actually good for her. Her renal function should not  preclude other procedures that we need to do.    CAD (coronary artery disease)      We know she has significant coronary disease as outlined above.    Pacemaker    The patient had a recent pacemaker for 2-1 block. This definitely helped her shortness of breath while she was in the hospital and afterwards. We knew at that time that she also had significant coronary disease.  As part of today's evaluation I completely reviewed her old records and the current records. In addition I have spoken by telephone with Dr. Swaz[place]and. He has reviewed her cath films and we have discussed the case at length. I feel it is most prudent not to proceed with cardiac catheterization today. We will continue to assess her and communicate with the patient and her family.   Willa Rough 07/22/2013 8:22 AM  I return to the patient's room for a second time this morning. She is more alert but still confused. There is a family member with her. It is very difficult to assess her complaints of arm and chest pain. EKG cannot help as her rhythm. I've ordered more troponins. I am very hesitant to proceed with any type of advanced cardiac intervention unless we are  sure that this patient's pain is ischemic in origin.  Jerral Bonito, MD  11:57AM

## 2013-07-22 NOTE — Progress Notes (Signed)
Inpatient Diabetes Program Recommendations  AACE/Anabelen: New Consensus Statement on Inpatient Glycemic Control (2013)  Target Ranges:  Prepandial:   less than 140 mg/dL      Peak postprandial:   less than 180 mg/dL (1-2 hours)      Critically ill patients:  140 - 180 mg/dL   Reason for Assessment: Hyperglycemia  Inpatient Diabetes Program Recommendations Insulin - Basal: Home dose Lantus per med rec is 40 units BID.  Current order Lantus 40 q HS.  CBG this AM was 211 mg/dl.  Please consider increasing Lantus dosage to 45 units.  Insulin - Meal Coverage: May benefit from addition of meal coverage 3 units tid if patient eats at least 50% of meal and CBG greater than 80 mg/dl. Oral Agents: Takes Amaryl at home.  Not on Amaryl here, and do not recommend it be given here in hospital. HgbA1C: Last known A1C was 10.0 with mean glucose of approximately 240. Diet: Heart Healthy.  Confused this AM, so may need this diet to encourage food intake.  However, consider changing to CHO Modified medium if appropriate later.  Note:  Results for CAHTERINE, HEINZEL (MRN [id number]) as of 07/22/2013 10:32  Ref. Range 07/21/2013 07:42 07/21/2013 11:56 07/21/2013 16:59 07/21/2013 21:48 07/22/2013 07:35  Glucose-Capillary Latest Range: 70-99 mg/dL 782 (H) 956 (H) 213 (H) 236 (H) 211 (H)   Given confusion and stage III renal disease, do not recommend tight control.  However, glycemic control could be improved.  Request MD consider:  Increasing Lantus dose to 45 units at HS  Adding Novolog meal coverage 3 units tid if patient eats at least 50% of meal and CBG > 80 mg/dl  Thank you.  Dalana Pfahler S. Elsie Lincoln, RN, CNS, CDE Inpatient Diabetes Program, team pager [phone]

## 2013-07-22 NOTE — Care Management Note (Signed)
    Page 1 of 1   07/22/2013     11:01:04 AM   CARE MANAGEMENT NOTE 07/22/2013  Patient:  GISEL, VIPOND   Account Number:  [id number]  Date Initiated:  07/22/2013  Documentation initiated by:  Junius Creamer  Subjective/Objective Assessment:   adm w mi     Action/Plan:   lives w fam, pcp dr Caryn Bee howard   Anticipated DC Date:     Anticipated DC Plan:        DC Planning Services  CM consult      Choice offered to / List presented to:             Status of service:   Medicare Important Message given?   (If response is "NO", the following Medicare IM given date fields will be blank) Date Medicare IM given:   Date Additional Medicare IM given:    Discharge Disposition:    Per UR Regulation:  Reviewed for med. necessity/level of care/duration of stay  If discussed at Long Length of Stay Meetings, dates discussed:    Comments:

## 2013-07-22 NOTE — Progress Notes (Signed)
Echocardiogram 2D Echocardiogram has been performed.  Becky Collins 07/22/2013, 12:16 PM

## 2013-07-22 NOTE — Consult Note (Signed)
Medical Consultation   Becky Collins  JYN:[id number]  DOB: Jan 16, 1932  DOA: 07/19/2013  PCP: Selinda Flavin, MD  Requesting physician: Dr Myrtis Ser  Reason for consultation: Altered mental status and medical management  History of Present Illness: Patient is a 79 year old female with a history of severe CAD, diabetes type 2, hypertension, recent pacemaker who had initially presented to [facility] and then transferred to [facility] on 07/19/2013 to cardiology service for NSTEMI. Patient was medically managed with heparin drip, nitro drip, aspirin, beta blocker, statins with plans of cardiac catheterization. Patient was however noticed to be confused in the last 2 days worse at night, she had a CT scan done which was negative for any acute stroke but did show age-related atrophy, chronic microvascular ischemia and remote right parietal infarct (which patient's family was unaware of). Patient daughter also reported that at her baseline, her mental status is fine and she is functional with her ADLs, no recent memory changes. She however did report that she is has a history of frequent UTIs, was on Septra daily for prophylaxis until August and since then she had 2 UTIs. Last UTI episode was in 8/14 which showed Citrobacter freundii. Patient's daughter also reported that she had been on Depakote for 1 (reported as generalized epileptic) seizure episode several years ago and Depakote was stopped in August this year. Patient's daughter reported that she has been fine and had no repeat seizures since then. During my neuro examination, patient's daughter mentioned that she had noticed transient right lower extremity weakness 3 days.    On her medication list, about Neurontin, as she takes 700 mg BID, patient on reported that she has history of PVD and had pain in her feet.    Allergies:  No Known Allergies    Past Medical History  Diagnosis Date  . Hypertension     moderate left ventricular hypertrophy   . Diabetes mellitus     h/o negative exercise stress test/normal LVF assessed June 2011 by echo EF 60-65%  . Hyperlipidemia   . Carotid artery disease     with less than 60% stenosis on the right in 2006  . History of seizure disorder     on depakote  . History of recurrent UTIs   . Chronic edema   . Depression   . Acid reflux disease   . History of medication noncompliance     this has been ongoing for quite sometime  . Complete heart block     a. previously documented Mobitz I 11/12;  b. 06/2013 Found to have CHB--> MDT Adapta L dc PPM, ser #: [id number].  . Stroke     .  Marland Kitchen Moderate mitral regurgitation     a. 05/2013 Echo: EF 55-65%, no rwma, Mod MR.  . CKD (chronic kidney disease), stage III   . Orthostatic hypotension   . Paroxysmal atrial tachycardia   . Normal Ejection Fraction     a. 05/2013 Echo: EF 55-65%.  Marland Kitchen CAD (coronary artery disease)     a. 06/2013 Cath: LM 25, LAD 67m, Diag 25 ost, LCX 16m, 80d, OM1 small, sev diff prox dzs, LPL mod, nl, RCA 100ost 2/ L->R collats.    Past Surgical History  Procedure Laterality Date  . Cesarean section  1973    Social History:  reports that she has never smoked. She has never used smokeless tobacco. She reports that she does not drink alcohol or use illicit drugs.  Family History  Problem Relation Age  of Onset  . Heart attack Father   . Heart attack Mother   . Diabetes Sister   . Hypertension Sister   . Peripheral vascular disease Sister   . Diabetes Brother   . Heart disease Brother   . Hypertension Brother     Review of Systems:  Patient is still quite confused and oriented to herself. She thinks she is at home and supposed to be as her children are there. Does not know why she's here, thinks this month is August and the year is 45. Unable to get review of systems from the patient herself but she follows commands for the examination.   Physical Exam: Blood pressure 116/65, pulse 76, temperature 98.3 F (36.8 C),  temperature source Oral, resp. rate 20, height 5\' 3"  (1.6 m), weight 89.7 kg (197 lb 12 oz), SpO2 100.00%.  General: Alert and awake, confused, not in any acute distress. HEENT: normocephalic, atraumatic, anicteric sclera, PERLA, EOMI, oropharynx clear CVS: S1-S2 clear, no murmur rubs or gallops Chest: CTAB, no wheezing, rales or rhonchi, pacemaker in the left chest wall Abdomen: soft nontender, nondistended, normal bowel sounds, no organomegaly Extremities: no cyanosis, clubbing or edema noted bilaterally Neuro: Cranial nerves II-XII intact, no focal neurological deficits, strength 5/5 in upper and lower extremeties Psych: alert and consfused Skin: no rashes or lesions  Labs on Admission:  Basic Metabolic Panel:  Recent Labs Lab 07/21/13 0348 07/22/13 0215  NA 131* 130*  K 4.4 4.2  CL 98 99  CO2 22 23  GLUCOSE 233* 246*  BUN 20 18  CREATININE 1.44* 1.32*  CALCIUM 8.3* 8.3*   Liver Function Tests:  Recent Labs Lab 07/19/13 2310  AST 30  ALT 20  ALKPHOS 67  BILITOT 0.3  PROT 6.6  ALBUMIN 3.2*   No results found for this basename: LIPASE, AMYLASE,  in the last 168 hours No results found for this basename: AMMONIA,  in the last 168 hours CBC:  Recent Labs Lab 07/20/13 0600  WBC 7.5  HGB 10.6*  HCT 33.6*  MCV 80.0  PLT 256   Cardiac Enzymes:  Recent Labs Lab 07/21/13 2027 07/22/13 0215 07/22/13 1215  TROPONINI <0.30 <0.30 <0.30   BNP: No components found with this basename: POCBNP,  CBG:  Recent Labs Lab 07/21/13 1156 07/21/13 1659 07/21/13 2148 07/22/13 0735 07/22/13 1141  GLUCAP 287* 181* 236* 211* 213*    Inpatient Medications:   Scheduled Meds: . amLODipine  5 mg Oral Daily  . antiseptic oral rinse  15 mL Mouth Rinse BID  . aspirin EC  81 mg Oral Daily  . atorvastatin  20 mg Oral q1800  . brimonidine  1 drop Both Eyes BID  . insulin aspart  0-15 Units Subcutaneous TID WC  . insulin glargine  45 Units Subcutaneous QHS  . metoprolol  tartrate  25 mg Oral BID  . pantoprazole  40 mg Oral Daily  . sertraline  100 mg Oral q morning - 10a   Continuous Infusions: . sodium chloride 10 mL/hr at 07/21/13 2000  . sodium chloride Stopped (07/20/13 1600)  . heparin 1,100 Units/hr (07/22/13 0930)  . nitroGLYCERIN 30 mcg/min (07/22/13 0800)     Radiological Exams on Admission: Ct Head Wo Contrast  07/20/2013   *RADIOLOGY REPORT*  Clinical Data: Confusion  CT HEAD WITHOUT CONTRAST  Technique:  Contiguous axial images were obtained from the base of the skull through the vertex without contrast.  Comparison: Prior MRI from 08/16/2011  Findings: Mild prominence of  the CSF containing spaces was compatible with generalized atrophy. Scattered and confluent hypodensities within the periventricular deep white matter most compatible with chronic microvascular ischemic disease.  Remote right parietal infarct is noted, unchanged as compared to prior MRI.  No acute intracranial hemorrhage is identified.  There is no large vessel territory infarct.  No extra-axial fluid collection. No midline shift or mass lesion.  Calvarium is intact.  Orbital soft tissues are within normal limits.  Paranasal sinuses and mastoid air cells are clear.  IMPRESSION: 1. No acute intracranial process.  2. Unchanged remote right parietal infarct.  3. Mild age related atrophy and chronic microvascular ischemic disease, similar to prior.   Original Report Authenticated By: Rise Mu, M.D.    Impression/Recommendations  Acute encephalopathy: Differential diagnoses includes UTI (patient had a UA on 07/20/2013 which showed moderate leukocytes with 11-20 WBC's and she has a history of frequent UTIs, was on prophylactic antibiotic till August, 14), ? CVA/TIA (she possibly can have vascular dementia from prior stroke), medication effect from high-dose Neurontin, hyponatremia, renal insufficiency. Blood cultures were negative so far - Urine culture is still pending for now  I will place her on Rocephin IV, assess for mental status improvement. Will follow urine cultures and sensitivities and adjust antibiotic accordingly. - She's not currently on Neurontin inpatient, avoid Ativan and narcotics, correct electrolytes. - Check serum osm, Ur osm, Ur Na, Ur creatinine.  - Unfortunately cannot do MRI to confirm if she has any stroke due to pacemaker, we can repeat CT head. - If no significant improvement in her mental status with IV antibiotics, will check EEG, for now hold off on restarting Depakote, DC'd in August 2014 by her PCP.    Diabetes mellitus: Uncontrolled  - Lantus increased to 45 units, changed diet to carb modified - Placed on NovoLog 3units TID with meals, check HbA1C    NSTEMI (non-ST elevated myocardial infarction)/ HTN, CAD - per cardiology    History of seizure disorder -  Per patient's daughter, Depakote was discontinued by PCP in August 2014, patient had no seizures after that. We'll check EEG for mental status did not improve despite being on antibiotics    CKD (chronic kidney disease), stage III -  appears to be at baseline, monitor closely   Thank you for this consultation, [facility] hospitalist service will be following the patient with you.  Time Spent on Admission: 1 hour  RAI,RIPUDEEP M.D. [facility] 07/22/2013, 2:36 PM

## 2013-07-22 NOTE — Progress Notes (Signed)
Patient ID: Becky Collins, female   DOB: 11/20/1931, 79 y.o.   MRN: [id number]      Reubin Milan is not working properly.

## 2013-07-22 NOTE — Progress Notes (Signed)
PHARMACY FOLLOW UP NOTE   Pharmacy Consult for : Heparin Indication: chest pain/ACS  Dosing Weight: 73 kg  Labs:  Recent Labs  07/19/13 2310 07/20/13 0600 07/20/13 1244 07/21/13 0348 07/21/13 1230 07/22/13 0215  HGB  --  10.6*  --   --   --   --   HCT  --  33.6*  --   --   --   --   PLT  --  256  --   --   --   --   HEPARINUNFRC  --  0.65 0.56  --  0.22* 0.31  CREATININE 1.34* 1.49*  --  1.44*  --  1.32*   Estimated Creatinine Clearance: 36.7 ml/min (by C-G formula based on Cr of 1.32).  Pertinent Medications:  Infusions:  . sodium chloride 10 mL/hr at 07/21/13 2000  . sodium chloride Stopped (07/20/13 1600)  . heparin 1,000 Units/hr (07/22/13 0800)  . nitroGLYCERIN 30 mcg/min (07/22/13 0800)   Assessment:  79 y/o female on Heparin infusion for NSTEMI.  Heparin rate 1000 units/hr.  Heparin level therapeutic 0.31 units/ml and has dropped overnight.  No bleeding complications noted.  Ongoing cardiology workup underway.  Troponin is negative this AM.    Goal:  Heparin level 0.3-0.7 units/ml   Plan: 1. Will increase IV Heparin to 1100 units/hr 2. Follow up cath plans. 3. Daily Heparin Levels, CBC.  Monitor for bleeding complications   Nadara Mustard, PharmD., MS Clinical Pharmacist Pager:  [phone] Thank you for allowing pharmacy to be part of this patients care team. 07/22/2013, 9:14 AM

## 2013-07-22 NOTE — Progress Notes (Signed)
Patient ID: Becky Collins, female   DOB: October 27, 1931, 79 y.o.   MRN: [id number]   I had a long discussion with the patient's daughter. She lives in [place]. She is a very good historian. Her name is Mayra Reel [phone].  She relates a history that the patient had been doing relatively well since she was home with her pacemaker. On Thursday of last week she had some increasing shortness of breath and then on Friday she felt worse. She was taken to [facility]. There was very slight troponin elevation and she was transferred to [place]. She has severe coronary disease. Because of this we were considering plans to proceed with intervening on her circumflex lesion. However her mental status has been abnormal since Saturday morning. She had a CT scan. There was no significant change. There was no acute event. There was suggestion of an old abnormality.  Her current mental status is abnormal. She seems delusional.  I am collecting more troponins since she is having intermittent chest and normal pain. We will not proceed with cardiac catheterization at this time.  I am consulting internal medicine to help review her entire case to see what I'm missing. There is a urine culture pending. She does not have an elevated white count. She is not on an antibiotic. The patient had been on DEPAKOTE for seizures for many years. Earlier this year it was stopped because she has had no recurring seizures. I believe that was stopped before her admission to the hospital in August, 2014. I am not completely sure. I do not know if this could be playing a role in any way with her mental status.  I had a long discussion with the patient's daughter. We will try to assess her mental status and stabilize her overall. After that time we'll make further decisions about her cardiac status.  Jerral Bonito, MD

## 2013-07-23 DIAGNOSIS — Z950 Presence of cardiac pacemaker: Secondary | ICD-10-CM

## 2013-07-23 LAB — URINE CULTURE

## 2013-07-23 LAB — BASIC METABOLIC PANEL
CO2: 20 mEq/L (ref 19–32)
GFR calc non Af Amer: 38 mL/min — ABNORMAL LOW (ref >90)
Glucose, Bld: 240 mg/dL — ABNORMAL HIGH (ref 70–99)
Potassium: 4.3 mEq/L (ref 3.5–5.1)
Sodium: 137 mEq/L (ref 135–145)

## 2013-07-23 LAB — TROPONIN I: Troponin I: <0.3 ng/mL (ref <0.30)

## 2013-07-23 LAB — VITAMIN B12: Vitamin B-12: 919 pg/mL — ABNORMAL HIGH (ref 211–911)

## 2013-07-23 LAB — HEPARIN LEVEL (UNFRACTIONATED): Heparin Unfractionated: 0.36 IU/mL (ref 0.30–0.70)

## 2013-07-23 LAB — OSMOLALITY, URINE: Osmolality, Ur: 120 mOsm/kg — ABNORMAL LOW (ref 390–1090)

## 2013-07-23 LAB — HEMOGLOBIN A1C
Hgb A1c MFr Bld: 10.4 % — ABNORMAL HIGH (ref <5.7)
Mean Plasma Glucose: 252 mg/dL — ABNORMAL HIGH (ref <117)

## 2013-07-23 MED ORDER — INSULIN GLARGINE 100 UNIT/ML ~~LOC~~ SOLN
50.0000 [IU] | Freq: Every day | SUBCUTANEOUS | Status: DC
  Administered 2013-07-23 – 2013-07-24 (×2): 50 [IU] via SUBCUTANEOUS
  Filled 2013-07-23 (×3): qty 0.5

## 2013-07-23 NOTE — Progress Notes (Signed)
[facility] Progress Note [place] TEAM 1 - Stepdown/ICU TEAM   Becky Collins ZOX:[id number] DOB: 05/17/1932 DOA: 07/19/2013 PCP: Selinda Flavin, MD  Brief narrative: Patient is a 79 year old female with a history of severe CAD, diabetes type 2, hypertension, recent pacemaker who had initially presented to [facility] and then transferred to [facility] on 07/19/2013 to cardiology service for NSTEMI. Patient was medically managed with heparin drip, nitro drip, aspirin, beta blocker, statins with plans of cardiac catheterization. Patient was however noticed to be confused in the last 2 days worse at night, she had a CT scan done which was negative for any acute stroke but did show age-related atrophy, chronic microvascular ischemia and remote right parietal infarct (which patient's family was unaware of).  Patient daughter also reported that at her baseline, her mental status is fine and she is functional with her ADLs, no recent memory changes. She however did report that she is has a history of frequent UTIs, was on Septra daily for prophylaxis until August and since then she had 2 UTIs. Last UTI episode was in 8/14 which showed Citrobacter freundii. Patient's daughter also reported that she had been on Depakote for 1 (reported as generalized epileptic) seizure episode several years ago and Depakote was stopped in August this year. Patient's daughter reported that she has been fine and had no repeat seizures since then. During my neuro examination, patient's daughter mentioned that she had noticed transient right lower extremity weakness 3 days.  On her medication list, about Neurontin, as she takes 700 mg BID, patient on reported that she has history of PVD and had pain in her feet.      Assessment/Plan: Principal Problem:   NSTEMI (non-ST elevated myocardial infarction) -Being managed by cardiology  Active Problems:   Acute encephalopathy -  Cause is still uncertain-patient's daughter  states that the patient usually does not get confused when she has urinary tract infections- CT of the head repeated and negative for acute infarct- will check a B12 -Possibly is hospital related delirium    HTN (hypertension) -Currently being appropriately managed    DM (diabetes mellitus) -Will need to increase Lantus again tonight as a.m. CBG still quite uncontrolled - A1c is 10.4 and reveals poor control at home-     History of seizure disorder -Depakote was discontinued many months ago as patient has been seizure free    CKD (chronic kidney disease), stage III    Pacemaker    Code Status: Full code Family Communication: Spoke with granddaughter today Disposition Plan: Per cardiology   Antibiotics: Rocephin   HPI/Subjective: Pt alert- answering some questions appropriately but not others-    Objective: Blood pressure 125/57, pulse 87, temperature 98.3 F (36.8 C), temperature source Oral, resp. rate 20, height 5\' 3"  (1.6 m), weight 89.7 kg (197 lb 12 oz), SpO2 98.00%.  Intake/Output Summary (Last 24 hours) at 07/23/13 1628 Last data filed at 07/23/13 1600  Gross per 24 hour  Intake    932 ml  Output   1350 ml  Net   -418 ml     Exam: General: No acute respiratory distress Lungs: Clear to auscultation bilaterally without wheezes or crackles Cardiovascular: Regular rate and rhythm without murmur gallop or rub normal S1 and S2 Abdomen: Nontender, nondistended, soft, bowel sounds positive, no rebound, no ascites, no appreciable mass Extremities: No significant cyanosis, clubbing, or edema bilateral lower extremities  Data Reviewed: Basic Metabolic Panel:  Recent Labs Lab 07/19/13 2310 07/20/13 0600 07/21/13  0348 07/22/13 0215 07/23/13 0430  NA 141 140 131* 130* 137  K 3.8 3.7 4.4 4.2 4.3  CL 105 105 98 99 103  CO2 25 23 22 23 20   GLUCOSE 95 73 233* 246* 240*  BUN 21 22 20 18 17   CREATININE 1.34* 1.49* 1.44* 1.32* 1.29*  CALCIUM 9.0 8.9 8.3* 8.3*  8.8   Liver Function Tests:  Recent Labs Lab 07/19/13 2310  AST 30  ALT 20  ALKPHOS 67  BILITOT 0.3  PROT 6.6  ALBUMIN 3.2*   No results found for this basename: LIPASE, AMYLASE,  in the last 168 hours No results found for this basename: AMMONIA,  in the last 168 hours CBC:  Recent Labs Lab 07/20/13 0600  WBC 7.5  HGB 10.6*  HCT 33.6*  MCV 80.0  PLT 256   Cardiac Enzymes:  Recent Labs Lab 07/21/13 2027 07/22/13 0215 07/22/13 1215 07/22/13 1754 07/22/13 2310  TROPONINI <0.30 <0.30 <0.30 <0.30 <0.30   BNP (last 3 results)  Recent Labs  05/27/13 1050 07/19/13 2310  PROBNP 608.1* 1110.0*   CBG:  Recent Labs Lab 07/22/13 1647 07/22/13 2134 07/23/13 0751 07/23/13 1207 07/23/13 1612  GLUCAP 297* 193* 227* 193* 190*    Recent Results (from the past 240 hour(s))  MRSA PCR SCREENING     Status: None   Collection Time    07/20/13  4:16 AM      Result Value Range Status   MRSA by PCR NEGATIVE  NEGATIVE Final   Comment:            The GeneXpert MRSA Assay (FDA     approved for NASAL specimens     only), is one component of a     comprehensive MRSA colonization     surveillance program. It is not     intended to diagnose MRSA     infection nor to guide or     monitor treatment for     MRSA infections.  URINE CULTURE     Status: None   Collection Time    07/20/13  9:27 PM      Result Value Range Status   Specimen Description URINE, CLEAN CATCH   Final   Special Requests CX ADDED AT 2152 ON [id number]   Final   Culture  Setup Time     Final   Value: 07/20/2013 22:04     Performed at [facility]   Colony Count     Final   Value: >=100,000 COLONIES/ML     Performed at [facility]   Culture     Final   Value: Multiple bacterial morphotypes present, none predominant. Suggest appropriate recollection if clinically indicated.     Performed at [facility]   Report Status 07/23/2013 FINAL   Final  CULTURE, BLOOD (ROUTINE X 2)      Status: None   Collection Time    07/20/13  9:30 PM      Result Value Range Status   Specimen Description BLOOD LEFT ARM   Final   Special Requests BOTTLES DRAWN AEROBIC ONLY 10CC   Final   Culture  Setup Time     Final   Value: 07/21/2013 14:33     Performed at [facility]   Culture     Final   Value:        BLOOD CULTURE RECEIVED NO GROWTH TO DATE CULTURE WILL BE HELD FOR 5 DAYS BEFORE ISSUING A FINAL NEGATIVE REPORT  Performed at [facility]   Report Status PENDING   Incomplete  CULTURE, BLOOD (ROUTINE X 2)     Status: None   Collection Time    07/20/13  9:43 PM      Result Value Range Status   Specimen Description BLOOD LEFT HAND   Final   Special Requests BOTTLES DRAWN AEROBIC AND ANAEROBIC 5CC EACH   Final   Culture  Setup Time     Final   Value: 07/21/2013 14:33     Performed at [facility]   Culture     Final   Value:        BLOOD CULTURE RECEIVED NO GROWTH TO DATE CULTURE WILL BE HELD FOR 5 DAYS BEFORE ISSUING A FINAL NEGATIVE REPORT     Performed at [facility]   Report Status PENDING   Incomplete     Studies:  Recent x-ray studies have been reviewed in detail by the Attending Physician  Scheduled Meds:  Scheduled Meds: . amLODipine  5 mg Oral Daily  . antiseptic oral rinse  15 mL Mouth Rinse BID  . aspirin EC  81 mg Oral Daily  . atorvastatin  20 mg Oral q1800  . brimonidine  1 drop Both Eyes BID  . cefTRIAXone (ROCEPHIN)  IV  1 g Intravenous Q24H  . insulin aspart  0-15 Units Subcutaneous TID WC  . insulin aspart  3 Units Subcutaneous TID WC  . insulin glargine  45 Units Subcutaneous QHS  . metoprolol tartrate  25 mg Oral BID  . pantoprazole  40 mg Oral Daily  . sertraline  100 mg Oral q morning - 10a   Continuous Infusions: . sodium chloride 10 mL/hr at 07/22/13 2000  . sodium chloride Stopped (07/20/13 1600)  . heparin 1,100 Units/hr (07/22/13 2000)  . nitroGLYCERIN 30 mcg/min (07/22/13 2000)    Time spent on  care of this patient: 30 minutes   Calvert Cantor, MD  [facility] Office  [phone] Pager - Text Page per Amion as per below:  On-Call/Text Page:      [email or web address]      password [email or web address]  If 7PM-7AM, please contact night-coverage [email or web address] Password [email or web address] 07/23/2013, 4:28 PM   LOS: 4 days

## 2013-07-23 NOTE — Progress Notes (Signed)
PHARMACY FOLLOW UP NOTE   Pharmacy Consult for : Heparin Indication: chest pain/ACS  Dosing Weight: 73 kg  Labs:  Recent Labs  07/20/13 1244 07/21/13 0348 07/21/13 1230 07/22/13 0215 07/23/13 0430  HEPARINUNFRC 0.56  --  0.22* 0.31 0.36  CREATININE  --  1.44*  --  1.32* 1.29*   Estimated Creatinine Clearance: 37.6 ml/min (by C-G formula based on Cr of 1.29).  Pertinent Medications:  Infusions:  . sodium chloride 10 mL/hr at 07/22/13 2000  . sodium chloride Stopped (07/20/13 1600)  . heparin 1,100 Units/hr (07/22/13 2000)  . nitroGLYCERIN 30 mcg/min (07/22/13 2000)   Assessment:  79 y/o female on Heparin infusion for NSTEMI.  Heparin level therapeutic today, awaiting further CV workup  Goal:  Heparin level 0.3-0.7 units/ml   Plan: 1. Continue heparin at 1100 units / hr 2. Follow up cath plans. 3. Daily Heparin Levels, CBC.  Monitor for bleeding complications   Thank you. Okey Regal, PharmD [phone] 07/23/2013, 9:09 AM

## 2013-07-23 NOTE — Progress Notes (Addendum)
Patient ID: Becky Collins, female   DOB: [birth year], 79 y.o.   MRN: [id number]  [facility]:  SUBJECTIVE:  I appreciate greatly the input from the internal medicine team. I obtained further troponins yesterday. They all continue to be normal. The patient is stable this morning. She continues to be disoriented. I've spoken to her son who is in the room.   Filed Vitals:   07/22/13 2155 07/22/13 2156 07/22/13 2355 07/23/13 0400  BP: 118/57 118/57 110/62 133/69  Pulse: 95  31   Temp:    98.4 F (36.9 C)  TempSrc:    Oral  Resp: 23  22 24   Height:      Weight:      SpO2: 96%  100% 91%    Intake/Output Summary (Last 24 hours) at 07/23/13 0747 Last data filed at 07/23/13 0600  Gross per 24 hour  Intake  672.5 ml  Output   2100 ml  Net -1427.5 ml    LABS: Basic Metabolic Panel:  Recent Labs  78/29/56 0215 07/23/13 0430  NA 130* 137  K 4.2 4.3  CL 99 103  CO2 23 20  GLUCOSE 246* 240*  BUN 18 17  CREATININE 1.32* 1.29*  CALCIUM 8.3* 8.8   Liver Function Tests: No results found for this basename: AST, ALT, ALKPHOS, BILITOT, PROT, ALBUMIN,  in the last 72 hours No results found for this basename: LIPASE, AMYLASE,  in the last 72 hours CBC: No results found for this basename: WBC, NEUTROABS, HGB, HCT, MCV, PLT,  in the last 72 hours Cardiac Enzymes:  Recent Labs  07/22/13 1215 07/22/13 1754 07/22/13 2310  TROPONINI <0.30 <0.30 <0.30   BNP: No components found with this basename: POCBNP,  D-Dimer: No results found for this basename: DDIMER,  in the last 72 hours Hemoglobin A1C: No results found for this basename: HGBA1C,  in the last 72 hours Fasting Lipid Panel: No results found for this basename: CHOL, HDL, LDLCALC, TRIG, CHOLHDL, LDLDIRECT,  in the last 72 hours Thyroid Function Tests: No results found for this basename: TSH, T4TOTAL, FREET3, T3FREE, THYROIDAB,  in the last 72 hours  RADIOLOGY: Dg Chest 2 View  06/25/2013   *RADIOLOGY REPORT*  Clinical  Data: Status post pacemaker insertion  CHEST - 2 VIEW  Comparison: 06/22/2013  Findings: Cardiac shadow is stable. A pacing device is now noted. No pneumothorax is seen. The lungs are clear bilaterally.  No acute bony abnormality is seen.  IMPRESSION: No acute abnormality noted following pacemaker placement.   Original Report Authenticated By: Alcide Clever, M.D.   Ct Head Wo Contrast  07/22/2013   CLINICAL DATA:  History of hypertension, diabetes, and heart disease. Confusion.  EXAM: CT HEAD WITHOUT CONTRAST  TECHNIQUE: Contiguous axial images were obtained from the base of the skull through the vertex without contrast.  COMPARISON:  07/20/2013.  FINDINGS: Stable atrophy and chronic microvascular ischemic change. No evidence for acute infarction, hemorrhage, mass lesion, hydrocephalus, or extra-axial fluid. Calvarium intact. Advanced vascular calcification. Minimal sinus fluid is stable. No mastoid inflammatory process.  IMPRESSION: Stable atrophy and chronic microvascular ischemic change. No acute intracranial findings.   Electronically Signed   By: Davonna Belling M.D.   On: 07/22/2013 18:27   Ct Head Wo Contrast  07/20/2013   *RADIOLOGY REPORT*  Clinical Data: Confusion  CT HEAD WITHOUT CONTRAST  Technique:  Contiguous axial images were obtained from the base of the skull through the vertex without contrast.  Comparison: Prior MRI from  08/16/2011  Findings: Mild prominence of the CSF containing spaces was compatible with generalized atrophy. Scattered and confluent hypodensities within the periventricular deep white matter most compatible with chronic microvascular ischemic disease.  Remote right parietal infarct is noted, unchanged as compared to prior MRI.  No acute intracranial hemorrhage is identified.  There is no large vessel territory infarct.  No extra-axial fluid collection. No midline shift or mass lesion.  Calvarium is intact.  Orbital soft tissues are within normal limits.  Paranasal sinuses and  mastoid air cells are clear.  IMPRESSION: 1. No acute intracranial process.  2. Unchanged remote right parietal infarct.  3. Mild age related atrophy and chronic microvascular ischemic disease, similar to prior.   Original Report Authenticated By: Rise Mu, M.D.    PHYSICAL EXAM   the patient is not oriented. She is stable. Cardiac exam reveals S1 and S2. The abdomen is soft. There is no significant peripheral edema.   TELEMETRY: I have reviewed telemetry today July 23, 2013. The rhythm is paced.   ASSESSMENT AND PLAN:      Chest and arm pain       Etiology remains unclear. We continue to try to decide if we should proceed with a coronary intervention. This will not be undertaken until her mental status is completely normal and we feel more certain that her symptoms are ischemic in origin. I may consider a nuclear scan to help with information gathering. I have discussed the anatomy again with Dr. [place] who reviewed the films yesterday. We have discussed options including CABG and PCI to the circumflex.         CABG:   We do not have a good view of the right coronary artery as it was not engaged in the cath lab. However it fills by collaterals. Repeat catheterization is not needed                           to assess this vessel further. The patient has anatomy for which CABG can clearly be considered. The significant issue is whether or not the patient is a candidate for                             Surgery. She does not have other significant limiting comorbidities other than her mental status. However this is clearly an issue.           PCI to Cx:    This can definitely be considered with the idea of approaching only the culprit lesion. This will not lead to complete revascularization. However considering                                       all circumstances, this option should get strong consideration.          HTN (hypertension)   DM (diabetes mellitus)    Ejection  fraction     Her followup echo yesterday shows her ejection fraction is 50-55%    Mitral regurgitation      Her followup echo yesterday reveals her mitral regurgitation remains moderate.    History of seizure disorder    She has been off Depakote since August, 2014. There have been no proven recurrent seizures. Internal medicine suggest that we not restarted yet.      CKD (chronic kidney  disease), stage III      Her kidney function is actually quite stable for her at this time.    CAD (coronary artery disease)     As I have noted above, we continued to try to make the best decisions about her coronary disease.    Pacemaker      Her recent pacemaker is working well. Initially this helped significantly with her sensation of shortness of breath.    Acute encephalopathy     Internal medicine is helping with this evaluation. Refer to their complete note from yesterday. We're waiting to see if she really has a significant urinary tract infection. She may also need other workup.  As I have outlined, the overall plan is to try to treat her underlying issues and wait for improvement in her mental status.. When her mental status is stabilized,  further evaluation can include a nuclear scan. If the patient has significant ischemia, or if she has positive troponins between now and then, or if we are really convinced that her symptoms are from cardiac ischemia, then we should proceed with an intervention. I reviewed the options above. It may be most prudent to ask for a surgical opinion at that time and then decide if we should undertake bypass or PCI. The patient has an attentive family. The most knowledgeable member will be her daughter, Mayra Reel, lives in [place], [phone].   Willa Rough 07/23/2013 7:47 AM

## 2013-07-24 DIAGNOSIS — I70219 Atherosclerosis of native arteries of extremities with intermittent claudication, unspecified extremity: Secondary | ICD-10-CM

## 2013-07-24 DIAGNOSIS — I2510 Atherosclerotic heart disease of native coronary artery without angina pectoris: Secondary | ICD-10-CM

## 2013-07-24 DIAGNOSIS — I10 Essential (primary) hypertension: Secondary | ICD-10-CM

## 2013-07-24 DIAGNOSIS — I779 Disorder of arteries and arterioles, unspecified: Secondary | ICD-10-CM

## 2013-07-24 DIAGNOSIS — E119 Type 2 diabetes mellitus without complications: Secondary | ICD-10-CM

## 2013-07-24 LAB — URINE CULTURE: Colony Count: 9000

## 2013-07-24 LAB — GLUCOSE, CAPILLARY
Glucose-Capillary: 208 mg/dL — ABNORMAL HIGH (ref 70–99)
Glucose-Capillary: 223 mg/dL — ABNORMAL HIGH (ref 70–99)
Glucose-Capillary: 187 mg/dL — ABNORMAL HIGH (ref 70–99)

## 2013-07-24 MED ORDER — HYDRALAZINE HCL 20 MG/ML IJ SOLN: 10.0000 mg | INTRAMUSCULAR | Status: DC | PRN

## 2013-07-24 MED ORDER — MORPHINE SULFATE 2 MG/ML IJ SOLN: 1.0000 mg | INTRAMUSCULAR | Status: DC | PRN

## 2013-07-24 MED ORDER — QUETIAPINE 12.5 MG HALF TABLET
12.5000 mg | ORAL_TABLET | Freq: Every day | ORAL | Status: DC
  Administered 2013-07-24 – 2013-07-26 (×3): 12.5 mg via ORAL
  Filled 2013-07-24 (×4): qty 1

## 2013-07-24 MED ORDER — INSULIN ASPART 100 UNIT/ML ~~LOC~~ SOLN
6.0000 [IU] | Freq: Three times a day (TID) | SUBCUTANEOUS | Status: DC
  Administered 2013-07-24 – 2013-07-27 (×8): 6 [IU] via SUBCUTANEOUS

## 2013-07-24 NOTE — Progress Notes (Signed)
ANTICOAGULATION CONSULT NOTE - Follow Up Consult  Pharmacy Consult for Heparin Indication: chest pain/ACS  No Known Allergies  Patient Measurements: Height: 5\' 3"  (160 cm) Weight: 197 lb 12 oz (89.7 kg) IBW/kg (Calculated) : 52.4       Vital Signs: Temp: 98.4 F (36.9 C) (10/15 0727) Temp src: Oral (10/15 0727) BP: 140/70 mmHg (10/15 0727) Pulse Rate: 90 (10/15 0727)  Labs:  Recent Labs  07/22/13 0215 07/22/13 1215 07/22/13 1754 07/22/13 2310 07/23/13 0430 07/24/13 0355  HEPARINUNFRC 0.31  --   --   --  0.36 0.31  CREATININE 1.32*  --   --   --  1.29*  --   TROPONINI <0.30 <0.30 <0.30 <0.30  --   --     Estimated Creatinine Clearance: 37.6 ml/min (by C-G formula based on Cr of 1.29).   Medications:  Prescriptions prior to admission  Medication Sig Dispense Refill  . amLODipine (NORVASC) 5 MG tablet Take 1 tablet (5 mg total) by mouth daily.  30 tablet  6  . aspirin 81 MG tablet Take 81 mg by mouth at bedtime.       . brimonidine (ALPHAGAN) 0.2 % ophthalmic solution Place 1 drop into both eyes 2 (two) times daily.      . calcium-vitamin D (OSCAL WITH D) 500-200 MG-UNIT per tablet Take 1 tablet by mouth.      . Cholecalciferol (VITAMIN D-3) 1000 UNITS CAPS Take by mouth daily.      Marland Kitchen gabapentin (NEURONTIN) 100 MG capsule Take 7 capsules (700 mg total) by mouth 2 (two) times daily.      Marland Kitchen glimepiride (AMARYL) 2 MG tablet Take 2 mg by mouth 4 (four) times daily.      . insulin glargine (LANTUS) 100 UNIT/ML injection Inject 40 Units into the skin 2 (two) times daily.      Marland Kitchen LORazepam (ATIVAN) 0.5 MG tablet Take 1 tablet (0.5 mg total) by mouth daily as needed. For anxiety  15 tablet  0  . metoprolol tartrate (LOPRESSOR) 25 MG tablet Take 1 tablet (25 mg total) by mouth 2 (two) times daily.  60 tablet  3  . MULTIPLE VITAMIN PO Take by mouth daily.      Marland Kitchen omeprazole (PRILOSEC) 20 MG capsule Take 20 mg by mouth every morning.       . pantoprazole (PROTONIX) 40 MG tablet  Take 40 mg by mouth daily.      . sertraline (ZOLOFT) 100 MG tablet Take 100 mg by mouth every morning.        79 yo female admitted 07/19/2013   with chest pain. Pharmacy consulted to dose heparin  Events: Concern for AMS 10/14  AC: ACS. HL therapeutic, CBC stable  CV: NSTEMI:  Norvasc, Lopressor, atorva, ASA, amlo,  EF 55 - 60 %, MRegurg, PAT, CAD, Complete Hblock, HLD, HTN  ENDO: SSI, Amaryl PTA, CBGs = 193-300  Infectious Disease 97.8 F (36.6 C) (Oral) , WBC 7.5 07/20/2013  Rocephin 10/13> Urine cx > Blood cx >  Gastrointestinal / Nutrition Reflux Neurology Hx Sz's, CVA > previously on Depakote but now on Neurontin, zoloft Nephrology CKD, CrCl 35-40 ml/min , Pulmonary: RA PTA Medication Issues Vitamins, Calcium gabapentin  Goals: Heparin level 0.3-0.7 Monitor Platelets Per protocol  Plan: 1) Continue heparin at 1100 units / hr 2) Follow up AM 3) Waiting for disorientation to clear before proceeding with CV plan

## 2013-07-24 NOTE — Progress Notes (Signed)
[facility] CONSULT Note [place] TEAM 1 - Stepdown/ICU TEAM   Becky Collins WJX:[id number] DOB: May 23, 1932 DOA: 07/19/2013 PCP: Selinda Flavin, MD  Brief narrative: 79 year old female with a history of severe CAD, diabetes type 2, hypertension, recent pacemaker who had initially presented to [facility] and then transferred to [facility] on 07/19/2013 to the [facility] Cardiology service for NSTEMI. Patient was medically managed with heparin drip, nitro drip, aspirin, beta blocker, and statins with plans for cardiac catheterization. Patient was noted to be confused after her transfer, with sx being worse at night.  She had a CT head which was negative for stroke but did show age-related atrophy, chronic microvascular ischemia and remote right parietal infarct. The patient's daughter reported that at her baseline her mental status is fine and she is functional with her ADLs with no recent memory changes. She however did report that she has a history of frequent UTIs, was on Septra daily for prophylaxis until August, and since then she has had 2 UTIs. Last UTI episode was in August with Citrobacter freundii. Patient's daughter also reported that she had been on Depakote for 1 (reported as generalized epileptic) seizure episode several years ago and Depakote was stopped in August this year. Patient's daughter reported that she has been fine and had no repeat seizures since then. Patient's daughter did mention that she had noticed transient right lower extremity weakness for 3 days.    Assessment/Plan:  NSTEMI (non-ST elevated myocardial infarction) managed by Cardiology/primary service   Acute encephalopathy idiopathic - patient's daughter states that the patient usually does not get confused when she has urinary tract infections - CT of the head negative for acute findings - B12 normal - TSH was normal in September - Ammonia level was normal in Aug - this may simply represent sundowning or it  could be her first episode of UTI associated delirium - patient admits to sleeping very poorly so sleep deprivation can also be adding to the problem - we'll initiate low-dose nightly Seroquel  UTI Culture not helpful in identifying the offending pathogen - UA is c/w UTI - cont empiric Rocephin and follow clinically - consider repeat UA in 48-72hrs - of note pt is MRSA screen negative - most recent helpful culture data from Aug + for Citrobacter whc was sensitive to Rocephin  HTN  Reasonably controlled at this time  DM CBG still quite uncontrolled - A1c10.4 revealing poor control at home - adjust treatment further and follow trend  History of seizure disorder Depakote was discontinued many months ago as patient has been seizure free  CKD stage III Baseline crt appears to be ~1.5-1.6 - renal function is stable/currently better than apparent baseline  Pacemaker  Code Status: FULL Family Communication: No family present at time of exam today  Antibiotics: Rocephin 10/13 >>  HPI/Subjective: The patient is alert pleasant and conversant.  She is mildly confused but not severely so.  She is able to me where she is and why she is here.  She tells me today this 1914 but then corrects herself.  She complains of chest pain and the nurse has alerted the cardiac team.  She denies shortness of breath abdominal pain fevers or chills.  She does admit to suprapubic cramping pains.  Objective: Blood pressure 113/57, pulse 88, temperature 98.4 F (36.9 C), temperature source Oral, resp. rate 24, height 5\' 3"  (1.6 m), weight 89.7 kg (197 lb 12 oz), SpO2 96.00%.  Intake/Output Summary (Last 24 hours) at 07/24/13  1107 Last data filed at 07/24/13 1100  Gross per 24 hour  Intake   1064 ml  Output    800 ml  Net    264 ml   Exam: General: No acute respiratory distress Lungs: Clear to auscultation bilaterally without wheezes or crackles Cardiovascular: Regular rate and rhythm  Abdomen: Mildly tender  to palpation in the suprapubic region, nondistended, soft, bowel sounds positive, no rebound, no ascites, no appreciable mass Extremities: No significant cyanosis, clubbing, or edema bilateral lower extremities  Data Reviewed: Basic Metabolic Panel:  Recent Labs Lab 07/19/13 2310 07/20/13 0600 07/21/13 0348 07/22/13 0215 07/23/13 0430  NA 141 140 131* 130* 137  K 3.8 3.7 4.4 4.2 4.3  CL 105 105 98 99 103  CO2 25 23 22 23 20   GLUCOSE 95 73 233* 246* 240*  BUN 21 22 20 18 17   CREATININE 1.34* 1.49* 1.44* 1.32* 1.29*  CALCIUM 9.0 8.9 8.3* 8.3* 8.8   Liver Function Tests:  Recent Labs Lab 07/19/13 2310  AST 30  ALT 20  ALKPHOS 67  BILITOT 0.3  PROT 6.6  ALBUMIN 3.2*   CBC:  Recent Labs Lab 07/20/13 0600  WBC 7.5  HGB 10.6*  HCT 33.6*  MCV 80.0  PLT 256   Cardiac Enzymes:  Recent Labs Lab 07/21/13 2027 07/22/13 0215 07/22/13 1215 07/22/13 1754 07/22/13 2310  TROPONINI <0.30 <0.30 <0.30 <0.30 <0.30   BNP (last 3 results)  Recent Labs  05/27/13 1050 07/19/13 2310  PROBNP 608.1* 1110.0*   CBG:  Recent Labs Lab 07/23/13 0751 07/23/13 1207 07/23/13 1612 07/23/13 2150 07/24/13 0807  GLUCAP 227* 193* 190* 208* 223*    Recent Results (from the past 240 hour(s))  MRSA PCR SCREENING     Status: None   Collection Time    07/20/13  4:16 AM      Result Value Range Status   MRSA by PCR NEGATIVE  NEGATIVE Final   Comment:            The GeneXpert MRSA Assay (FDA     approved for NASAL specimens     only), is one component of a     comprehensive MRSA colonization     surveillance program. It is not     intended to diagnose MRSA     infection nor to guide or     monitor treatment for     MRSA infections.  URINE CULTURE     Status: None   Collection Time    07/20/13  9:27 PM      Result Value Range Status   Specimen Description URINE, CLEAN CATCH   Final   Special Requests CX ADDED AT 2152 ON [id number]   Final   Culture  Setup Time     Final    Value: 07/20/2013 22:04     Performed at [facility]   Colony Count     Final   Value: >=100,000 COLONIES/ML     Performed at [facility]   Culture     Final   Value: Multiple bacterial morphotypes present, none predominant. Suggest appropriate recollection if clinically indicated.     Performed at [facility]   Report Status 07/23/2013 FINAL   Final  CULTURE, BLOOD (ROUTINE X 2)     Status: None   Collection Time    07/20/13  9:30 PM      Result Value Range Status   Specimen Description BLOOD LEFT ARM   Final  Special Requests BOTTLES DRAWN AEROBIC ONLY 10CC   Final   Culture  Setup Time     Final   Value: 07/21/2013 14:33     Performed at [facility]   Culture     Final   Value:        BLOOD CULTURE RECEIVED NO GROWTH TO DATE CULTURE WILL BE HELD FOR 5 DAYS BEFORE ISSUING A FINAL NEGATIVE REPORT     Performed at [facility]   Report Status PENDING   Incomplete  CULTURE, BLOOD (ROUTINE X 2)     Status: None   Collection Time    07/20/13  9:43 PM      Result Value Range Status   Specimen Description BLOOD LEFT HAND   Final   Special Requests BOTTLES DRAWN AEROBIC AND ANAEROBIC 5CC EACH   Final   Culture  Setup Time     Final   Value: 07/21/2013 14:33     Performed at [facility]   Culture     Final   Value:        BLOOD CULTURE RECEIVED NO GROWTH TO DATE CULTURE WILL BE HELD FOR 5 DAYS BEFORE ISSUING A FINAL NEGATIVE REPORT     Performed at [facility]   Report Status PENDING   Incomplete     Studies:  Recent x-ray studies have been reviewed in detail by the Attending Physician  Scheduled Meds:  Scheduled Meds: . amLODipine  5 mg Oral Daily  . antiseptic oral rinse  15 mL Mouth Rinse BID  . aspirin EC  81 mg Oral Daily  . atorvastatin  20 mg Oral q1800  . brimonidine  1 drop Both Eyes BID  . cefTRIAXone (ROCEPHIN)  IV  1 g Intravenous Q24H  . insulin aspart  0-15 Units Subcutaneous TID WC  . insulin  aspart  3 Units Subcutaneous TID WC  . insulin glargine  50 Units Subcutaneous QHS  . metoprolol tartrate  25 mg Oral BID  . pantoprazole  40 mg Oral Daily  . sertraline  100 mg Oral q morning - 10a    Time spent on care of this patient: 35 minutes   Gaetano Romberger T, MD  [facility] Office  [phone] Pager - Text Page per Loretha Stapler as per below:  On-Call/Text Page:      [email or web address]      password [email or web address]  If 7PM-7AM, please contact night-coverage [email or web address] Password [email or web address] 07/24/2013, 11:07 AM   LOS: 5 days

## 2013-07-24 NOTE — Progress Notes (Signed)
[facility]  Patient Status: Active (receiving services up to time of hospitalization)  [facility] is providing the following services: RN, PT and OT  If patient discharges after hours, please call [phone].   Becky Collins 07/24/2013, 3:35 PM

## 2013-07-24 NOTE — Progress Notes (Signed)
Complained of  Left sided chest pain, scale 10/10, sharp and vomited to undigested food in small amount, bp 200 systolic. Nitro increased to 100 mcg /hr,  , ekg done , morphine 2 mg iv given. Labuer PA made aware. DR. Hochrein came to see pt. And made aware about the chest pain, and explained to the son that the pain is not really cardiac .Pt claimed that pain is much lesser and went to sleep. Continue to monitor. Nitro gtt titrated down.

## 2013-07-24 NOTE — Progress Notes (Signed)
Inpatient Diabetes Program Recommendations  [facility]: New Consensus Statement on Inpatient Glycemic Control (2013)  Target Ranges:  Prepandial:   less than 140 mg/dL      Peak postprandial:   less than 180 mg/dL (1-2 hours)      Critically ill patients:  140 - 180 mg/dL   Consider increasing Lantus to 55 units since fasting CBG elevated 223. Thank you  Piedad Climes BSN, RN,CDE Inpatient Diabetes Coordinator [phone] (team pager)

## 2013-07-24 NOTE — Progress Notes (Signed)
SUBJECTIVE:  She had nausea and vomiting today.  She then had a BP spike and was treated with increased IV NTG and morphine.  Currently she has continued chest pain which is tenderness to palpation at the pacemaker site and lower on the left anterior ribs.   PHYSICAL EXAM Filed Vitals:   07/24/13 0000 07/24/13 0317 07/24/13 0400 07/24/13 0727  BP:  147/68  140/70  Pulse:   86 90  Temp: 98.4 F (36.9 C)  98.1 F (36.7 C) 98.4 F (36.9 C)  TempSrc: Oral  Oral Oral  Resp:  23 20 20   Height:      Weight:      SpO2: 93%  96% 98%   General:  No acute distress Lungs:  Bilateral basilar crackles.  Heart:  RRR Abdomen:  Positive bowel sounds, no rebound no guarding Extremities:  No edema Chest:  Pacer site OK. Neuro:  Nofocal  LABS: Lab Results  Component Value Date   TROPONINI <0.30 07/22/2013   Results for orders placed during the hospital encounter of 07/19/13 (from the past 24 hour(s))  GLUCOSE, CAPILLARY     Status: Abnormal   Collection Time    07/23/13 12:07 PM      Result Value Range   Glucose-Capillary 193 (*) 70 - 99 mg/dL  VITAMIN Z61     Status: Abnormal   Collection Time    07/23/13  3:00 PM      Result Value Range   Vitamin B-12 919 (*) 211 - 911 pg/mL  GLUCOSE, CAPILLARY     Status: Abnormal   Collection Time    07/23/13  4:12 PM      Result Value Range   Glucose-Capillary 190 (*) 70 - 99 mg/dL  GLUCOSE, CAPILLARY     Status: Abnormal   Collection Time    07/23/13  9:50 PM      Result Value Range   Glucose-Capillary 208 (*) 70 - 99 mg/dL  HEPARIN LEVEL (UNFRACTIONATED)     Status: None   Collection Time    07/24/13  3:55 AM      Result Value Range   Heparin Unfractionated 0.31  0.30 - 0.70 IU/mL  GLUCOSE, CAPILLARY     Status: Abnormal   Collection Time    07/24/13  8:07 AM      Result Value Range   Glucose-Capillary 223 (*) 70 - 99 mg/dL    Intake/Output Summary (Last 24 hours) at 07/24/13 1035 Last data filed at 07/24/13 0902  Gross per 24  hour  Intake   1028 ml  Output    800 ml  Net    228 ml    ASSESSMENT AND PLAN:  NSTEMI:   Her current pain and the pain over the last few days has been musculoskeletal and there has been no evidence of an ischemic etiology.  She would be a very poor candidate for surgical revascularization.  She would not be ideal for PCI and it is not clear that she needs this.  Eventually risk stratifying with Lexiscan Myoview would be helpful.  For now I will stop the IV heparin.  I will use the NTG for BP control but will likely stop this.  Chest pain management should try to lean toward non narcotic treatment.  I had a long discussion with her son about this today.   AMS:  No acute neurologic process evident. Question, hospital delirium.  Reducing the use of morphine   DM:  Continue current therapy. I  appreciate IM help.   HTN:  BP shot up today.  Coming down.  This will be addressed as above.  Continue current therapy.   PACEMAKER:  Status post [employer] L pacemaker last month.  Normal function.    CKD:   Creat stable.  See above.    Fayrene Fearing [facility] 07/24/2013 10:35 AM

## 2013-07-24 NOTE — Progress Notes (Signed)
IVT made aware to restart iv line.

## 2013-07-25 ENCOUNTER — Encounter (HOSPITAL_COMMUNITY): Payer: Self-pay | Admitting: Physician Assistant

## 2013-07-25 LAB — BASIC METABOLIC PANEL
BUN: 17 mg/dL (ref 6–23)
CO2: 21 mEq/L (ref 19–32)
Chloride: 105 mEq/L (ref 96–112)
Creatinine, Ser: 1.21 mg/dL — ABNORMAL HIGH (ref 0.50–1.10)
GFR calc non Af Amer: 41 mL/min — ABNORMAL LOW (ref >90)
Glucose, Bld: 163 mg/dL — ABNORMAL HIGH (ref 70–99)
Potassium: 4 mEq/L (ref 3.5–5.1)

## 2013-07-25 LAB — GLUCOSE, CAPILLARY
Glucose-Capillary: 183 mg/dL — ABNORMAL HIGH (ref 70–99)
Glucose-Capillary: 193 mg/dL — ABNORMAL HIGH (ref 70–99)
Glucose-Capillary: 119 mg/dL — ABNORMAL HIGH (ref 70–99)
Glucose-Capillary: 143 mg/dL — ABNORMAL HIGH (ref 70–99)

## 2013-07-25 MED ORDER — INSULIN GLARGINE 100 UNIT/ML ~~LOC~~ SOLN
54.0000 [IU] | Freq: Every day | SUBCUTANEOUS | Status: DC
  Administered 2013-07-25 – 2013-07-26 (×2): 54 [IU] via SUBCUTANEOUS
  Filled 2013-07-25 (×3): qty 0.54

## 2013-07-25 MED ORDER — ACETAMINOPHEN 500 MG PO TABS
1000.0000 mg | ORAL_TABLET | Freq: Three times a day (TID) | ORAL | Status: DC
  Administered 2013-07-25 – 2013-07-27 (×7): 1000 mg via ORAL
  Filled 2013-07-25 (×10): qty 2

## 2013-07-25 MED ORDER — BISACODYL 10 MG RE SUPP: 10.0000 mg | Freq: Every day | RECTAL | Status: DC | PRN

## 2013-07-25 MED ORDER — FLEET ENEMA 7-19 GM/118ML RE ENEM
1.0000 | ENEMA | Freq: Every day | RECTAL | Status: DC | PRN
  Filled 2013-07-25: qty 1

## 2013-07-25 MED ORDER — NITROGLYCERIN 2 % TD OINT
2.0000 [in_us] | TOPICAL_OINTMENT | Freq: Four times a day (QID) | TRANSDERMAL | Status: DC
  Administered 2013-07-25 – 2013-07-27 (×8): 2 [in_us] via TOPICAL
  Filled 2013-07-25: qty 30

## 2013-07-25 MED ORDER — POLYETHYLENE GLYCOL 3350 17 G PO PACK
17.0000 g | PACK | Freq: Two times a day (BID) | ORAL | Status: DC
  Administered 2013-07-25 – 2013-07-27 (×4): 17 g via ORAL
  Filled 2013-07-25 (×6): qty 1

## 2013-07-25 NOTE — Progress Notes (Signed)
[facility] CONSULT Note [place] TEAM 1 - Stepdown/ICU TEAM   Becky Collins VHQ:[id number] DOB: 1931/11/08 DOA: 07/19/2013 PCP: Selinda Flavin, MD  Brief narrative: 79 year old female with a history of severe CAD, diabetes type 2, hypertension, recent pacemaker who had initially presented to [facility] and then transferred to [facility] on 07/19/2013 to the [facility] Cardiology service for NSTEMI. Patient was medically managed with heparin drip, nitro drip, aspirin, beta blocker, and statins with plans for cardiac catheterization. Patient was noted to be confused after her transfer, with sx being worse at night.  She had a CT head which was negative for stroke but did show age-related atrophy, chronic microvascular ischemia and remote right parietal infarct. The patient's daughter reported that at her baseline her mental status is fine and she is functional with her ADLs with no recent memory changes. She however did report that she has a history of frequent UTIs, was on Septra daily for prophylaxis until August, and since then she has had 2 UTIs. Last UTI episode was in August with Citrobacter freundii. Patient's daughter also reported that she had been on Depakote for 1 (reported as generalized epileptic) seizure episode several years ago and Depakote was stopped in August this year. Patient's daughter reported that she has been fine and had no repeat seizures since then. Patient's daughter did mention that she had noticed transient right lower extremity weakness for 3 days.    Assessment/Plan:  NSTEMI (non-ST elevated myocardial infarction) managed by Cardiology/primary service   Acute encephalopathy idiopathic - patient's daughter states that the patient usually does not get confused when she has urinary tract infections - CT of the head negative for acute findings - B12 normal - TSH was normal in September - Ammonia level was normal in Aug - this may simply represent sundowning or it  could be her first episode of UTI associated delirium - patient admits to sleeping very poorly so sleep deprivation can also be adding to the problem - Dr Dolphus Jenny initiated low-dose nightly Seroquel  UTI Culture not helpful in identifying the offending pathogen - UA is c/w UTI - cont empiric Rocephin and follow clinically - - repeat UA tomorrow - of note pt is MRSA screen negative - most recent helpful culture data from Aug + for Citrobacter whc was sensitive to Rocephin  HTN  Reasonably controlled at this time  DM CBG still quite uncontrolled - A1c10.4 revealing poor control at home - Increase Lantus further today  History of seizure disorder Depakote was discontinued many months ago as patient has been seizure free  CKD stage III Baseline crt appears to be ~1.5-1.6 - renal function is stable/currently better than apparent baseline  Pacemaker  Code Status: FULL Family Communication: No family present at time of exam today  Antibiotics: Rocephin 10/13 >>  HPI/Subjective: The patient is alert pleasant and conversant.Per daughter at bedside, confusion is improving. Discussed high sugars - this dauther is not the one that checks her sugars and therefore is unbale to tell me how the sugars are at home.  Objective: Blood pressure 129/58, pulse 77, temperature 98 F (36.7 C), temperature source Oral, resp. rate 23, height 5\' 3"  (1.6 m), weight 89.7 kg (197 lb 12 oz), SpO2 100.00%.  Intake/Output Summary (Last 24 hours) at 07/25/13 1722 Last data filed at 07/25/13 1556  Gross per 24 hour  Intake    690 ml  Output    625 ml  Net     65 ml  Exam: General: No acute respiratory distress Lungs: Clear to auscultation bilaterally without wheezes or crackles Cardiovascular: Regular rate and rhythm  Abdomen: Mildly tender to palpation in the suprapubic region, nondistended, soft, bowel sounds positive, no rebound, no ascites, no appreciable mass Extremities: No significant cyanosis,  clubbing, or edema bilateral lower extremities  Data Reviewed: Basic Metabolic Panel:  Recent Labs Lab 07/20/13 0600 07/21/13 0348 07/22/13 0215 07/23/13 0430 07/25/13 0416  NA 140 131* 130* 137 139  K 3.7 4.4 4.2 4.3 4.0  CL 105 98 99 103 105  CO2 23 22 23 20 21   GLUCOSE 73 233* 246* 240* 163*  BUN 22 20 18 17 17   CREATININE 1.49* 1.44* 1.32* 1.29* 1.21*  CALCIUM 8.9 8.3* 8.3* 8.8 9.1   Liver Function Tests:  Recent Labs Lab 07/19/13 2310  AST 30  ALT 20  ALKPHOS 67  BILITOT 0.3  PROT 6.6  ALBUMIN 3.2*   CBC:  Recent Labs Lab 07/20/13 0600  WBC 7.5  HGB 10.6*  HCT 33.6*  MCV 80.0  PLT 256   Cardiac Enzymes:  Recent Labs Lab 07/21/13 2027 07/22/13 0215 07/22/13 1215 07/22/13 1754 07/22/13 2310  TROPONINI <0.30 <0.30 <0.30 <0.30 <0.30   BNP (last 3 results)  Recent Labs  05/27/13 1050 07/19/13 2310  PROBNP 608.1* 1110.0*   CBG:  Recent Labs Lab 07/24/13 1632 07/24/13 2157 07/25/13 0817 07/25/13 1213 07/25/13 1639  GLUCAP 146* 126* 183* 193* 119*    Recent Results (from the past 240 hour(s))  MRSA PCR SCREENING     Status: None   Collection Time    07/20/13  4:16 AM      Result Value Range Status   MRSA by PCR NEGATIVE  NEGATIVE Final   Comment:            The GeneXpert MRSA Assay (FDA     approved for NASAL specimens     only), is one component of a     comprehensive MRSA colonization     surveillance program. It is not     intended to diagnose MRSA     infection nor to guide or     monitor treatment for     MRSA infections.  URINE CULTURE     Status: None   Collection Time    07/20/13  9:27 PM      Result Value Range Status   Specimen Description URINE, CLEAN CATCH   Final   Special Requests CX ADDED AT 2152 ON [id number]   Final   Culture  Setup Time     Final   Value: 07/20/2013 22:04     Performed at [facility]   Colony Count     Final   Value: >=100,000 COLONIES/ML     Performed at [facility]    Culture     Final   Value: Multiple bacterial morphotypes present, none predominant. Suggest appropriate recollection if clinically indicated.     Performed at [facility]   Report Status 07/23/2013 FINAL   Final  CULTURE, BLOOD (ROUTINE X 2)     Status: None   Collection Time    07/20/13  9:30 PM      Result Value Range Status   Specimen Description BLOOD LEFT ARM   Final   Special Requests BOTTLES DRAWN AEROBIC ONLY 10CC   Final   Culture  Setup Time     Final   Value: 07/21/2013 14:33     Performed at [facility]   Culture     Final   Value:        BLOOD CULTURE RECEIVED NO GROWTH TO DATE CULTURE WILL BE HELD FOR 5 DAYS BEFORE ISSUING A FINAL NEGATIVE REPORT     Performed at [facility]   Report Status PENDING   Incomplete  CULTURE, BLOOD (ROUTINE X 2)     Status: None   Collection Time    07/20/13  9:43 PM      Result Value Range Status   Specimen Description BLOOD LEFT HAND   Final   Special Requests BOTTLES DRAWN AEROBIC AND ANAEROBIC 5CC EACH   Final   Culture  Setup Time     Final   Value: 07/21/2013 14:33     Performed at [facility]   Culture     Final   Value:        BLOOD CULTURE RECEIVED NO GROWTH TO DATE CULTURE WILL BE HELD FOR 5 DAYS BEFORE ISSUING A FINAL NEGATIVE REPORT     Performed at [facility]   Report Status PENDING   Incomplete  URINE CULTURE     Status: None   Collection Time    07/23/13  9:52 AM      Result Value Range Status   Specimen Description URINE, RANDOM   Final   Special Requests NONE   Final   Culture  Setup Time     Final   Value: 07/23/2013 15:20     Performed at [facility] Count     Final   Value: 9,000 COLONIES/ML     Performed at [facility]   Culture     Final   Value: INSIGNIFICANT GROWTH     Performed at [facility]   Report Status 07/24/2013 FINAL   Final     Studies:  Recent x-ray studies have been reviewed in detail by the Attending  Physician  Scheduled Meds:  Scheduled Meds: . acetaminophen  1,000 mg Oral TID  . amLODipine  5 mg Oral Daily  . antiseptic oral rinse  15 mL Mouth Rinse BID  . aspirin EC  81 mg Oral Daily  . atorvastatin  20 mg Oral q1800  . brimonidine  1 drop Both Eyes BID  . cefTRIAXone (ROCEPHIN)  IV  1 g Intravenous Q24H  . insulin aspart  0-15 Units Subcutaneous TID WC  . insulin aspart  6 Units Subcutaneous TID WC  . insulin glargine  50 Units Subcutaneous QHS  . metoprolol tartrate  25 mg Oral BID  . nitroGLYCERIN  2 inch Topical Q6H  . pantoprazole  40 mg Oral Daily  . polyethylene glycol  17 g Oral BID  . QUEtiapine  12.5 mg Oral QHS  . sertraline  100 mg Oral q morning - 10a    Time spent on care of this patient: 25 minutes   Calvert Cantor, MD  [facility] Office  [phone] Pager - Text Page per Loretha Stapler as per below:  On-Call/Text Page:      [email or web address]      password [email or web address]  If 7PM-7AM, please contact night-coverage [email or web address] Password [email or web address] 07/25/2013, 5:22 PM   LOS: 6 days

## 2013-07-25 NOTE — Progress Notes (Signed)
Patient Name: Becky Collins Date of Encounter: 07/25/2013  Principal Problem:   NSTEMI (non-ST elevated myocardial infarction) Active Problems:   HTN (hypertension)   DM (diabetes mellitus)   Ejection fraction   Mitral regurgitation   History of seizure disorder   CKD (chronic kidney disease), stage III   CAD (coronary artery disease)   Pacemaker   Abnormal mini-mental status exam   Acute encephalopathy    SUBJECTIVE: C/o ongoing pain upper left chest. C/o constipation, takes Miralax bid at home. C/o burning w/ urination.  OBJECTIVE Filed Vitals:   07/25/13 0000 07/25/13 0014 07/25/13 0353 07/25/13 0410  BP: 118/71  150/75   Pulse: 77     Temp:  98.6 F (37 C)  98.5 F (36.9 C)  TempSrc:  Oral  Oral  Resp: 23  22   Height:      Weight:      SpO2: 94%   94%    Intake/Output Summary (Last 24 hours) at 07/25/13 0736 Last data filed at 07/25/13 0600  Gross per 24 hour  Intake    750 ml  Output   1225 ml  Net   -475 ml   Filed Weights   07/19/13 2120 07/19/13 2200 07/20/13 0500  Weight: 190 lb 14.7 oz (86.6 kg) 190 lb 14.7 oz (86.6 kg) 197 lb 12 oz (89.7 kg)    PHYSICAL EXAM General: Well developed, well nourished, elderly female appears uncomfortable. Head: Normocephalic, atraumatic.  Neck: Supple without bruits, JVD not elevated Lungs:  Resp regular and unlabored, decreased BS bases . Still w/ chest wall tenderness. Heart: RRR, S1, S2, no S3, S4, or murmur; no rub. Abdomen: Soft, non-tender, non-distended, BS + x 4.  Extremities: No clubbing, cyanosis, no edema.  Neuro: Alert and oriented X 3. Moves all extremities spontaneously. Psych: appears anxious  LABS: Basic Metabolic Panel: Recent Labs  07/23/13 0430 07/25/13 0416  NA 137 139  K 4.3 4.0  CL 103 105  CO2 20 21  GLUCOSE 240* 163*  BUN 17 17  CREATININE 1.29* 1.21*  CALCIUM 8.8 9.1   Cardiac Enzymes: Recent Labs  07/22/13 1215 07/22/13 1754 07/22/13 2310  TROPONINI <0.30 <0.30  <0.30   BNP: Pro B Natriuretic peptide (BNP)  Date/Time Value Range Status  07/19/2013 11:10 PM 1110.0* 0 - 450 pg/mL Final  05/27/2013 10:50 AM 608.1* 0 - 450 pg/mL Final   Hemoglobin A1C: Recent Labs  07/23/13 0430  HGBA1C 10.4*   Fasting Lipid Panel:   Thyroid Function Tests:   06/23/2013    TSH 2.048    Anemia Panel: Recent Labs  07/23/13 1500  VITAMINB12 919*    TELE:        Radiology/Studies: No results found.   Current Medications:  . amLODipine  5 mg Oral Daily  . antiseptic oral rinse  15 mL Mouth Rinse BID  . aspirin EC  81 mg Oral Daily  . atorvastatin  20 mg Oral q1800  . brimonidine  1 drop Both Eyes BID  . cefTRIAXone (ROCEPHIN)  IV  1 g Intravenous Q24H  . insulin aspart  0-15 Units Subcutaneous TID WC  . insulin aspart  6 Units Subcutaneous TID WC  . insulin glargine  50 Units Subcutaneous QHS  . metoprolol tartrate  25 mg Oral BID  . pantoprazole  40 mg Oral Daily  . QUEtiapine  12.5 mg Oral QHS  . sertraline  100 mg Oral q morning - 10a   . sodium chloride  Stopped (07/20/13 1600)  . nitroGLYCERIN 20 mcg/min (07/24/13 1200)    ASSESSMENT AND PLAN: 79 yo f w/ hx PPM 06/2013 at [facility] for SOB, gen malaise and chest pain at [facility] site. Tx [facility]. Enzymes improved rapidly, pain c/w MS pain/chest wall pain.   Principal Problem:   NSTEMI (non-ST elevated myocardial infarction) - slight elevation in initial troponin at [facility] and here, all others negative. Medical therapy w/ ASA, statin, BB    Chest pain - will schedule Tylenol qid and see if helps. Told her no narcotics due to fall risk. MD advise on NSAIDs.  Active Problems:   HTN (hypertension) - SBP 174 10:30 yest am, o/w good control, follow on current Rx.    DM (diabetes mellitus) - CBG range 126-227 last 24 hr, A1c 10.4, encourage med/diet compliance, f/u primary MD    Constipation - restart home Miralax, Dulcolax supp +/- enema as needed.    Urinary burning - previous UA abnl, Cx unclear, repeat.  Recheck CBC in am.  Otherwise, continue current Rx. Lives w/ granddaughter. Begin ambulation tid and see how tolerated.   Otherwise, continue current Rx, d/c when medically stable.   Ejection fraction - see echo   Mitral regurgitation - see echo   History of seizure disorder   CKD (chronic kidney disease), stage III   CAD (coronary artery disease)   Pacemaker   Abnormal mini-mental status exam   Acute encephalopathy   Signed, Theodore Demark , PA-C 7:36 AM 07/25/2013  History and all data above reviewed.  Patient examined.  I agree with the findings as above.  She looks better this am.  She is not having chest pain.  She has had some nausea but she is eating.  No abdominal complaints. The patient exam reveals COR:RRR  ,  Lungs: Clear  ,  Abd: Positive bowel sounds, no rebound no guarding, Ext No edema  .  All available labs, radiology testing, previous records reviewed. Agree with documented assessment and plan. I am planning medical management of her CAD as there has been no further objective evidence of ischemia and the pain has been very atypical.  I will discontinue the IV NTG but start NTG paste. Continue other therapies for now.  She needs to ambulate today.   Fayrene Fearing Demetrio Leighty  8:20 AM  07/25/2013

## 2013-07-26 ENCOUNTER — Ambulatory Visit: Payer: Medicare Other | Admitting: Cardiology

## 2013-07-26 ENCOUNTER — Inpatient Hospital Stay (HOSPITAL_COMMUNITY): Payer: Medicare Other

## 2013-07-26 LAB — URINALYSIS, ROUTINE W REFLEX MICROSCOPIC
Glucose, UA: NEGATIVE mg/dL
Hgb urine dipstick: NEGATIVE
Ketones, ur: NEGATIVE mg/dL
Leukocytes, UA: NEGATIVE
Protein, ur: NEGATIVE mg/dL
pH: 5.5 (ref 5.0–8.0)

## 2013-07-26 LAB — CBC
HCT: 33.4 % — ABNORMAL LOW (ref 36.0–46.0)
Hemoglobin: 10.2 g/dL — ABNORMAL LOW (ref 12.0–15.0)
MCH: 24.5 pg — ABNORMAL LOW (ref 26.0–34.0)
MCHC: 30.5 g/dL (ref 30.0–36.0)
MCV: 80.3 fL (ref 78.0–100.0)
RBC: 4.16 MIL/uL (ref 3.87–5.11)
WBC: 7.1 10*3/uL (ref 4.0–10.5)

## 2013-07-26 LAB — GLUCOSE, CAPILLARY
Glucose-Capillary: 178 mg/dL — ABNORMAL HIGH (ref 70–99)
Glucose-Capillary: 131 mg/dL — ABNORMAL HIGH (ref 70–99)
Glucose-Capillary: 100 mg/dL — ABNORMAL HIGH (ref 70–99)

## 2013-07-26 NOTE — Progress Notes (Signed)
[facility] CONSULT Note [place] TEAM 1 - Stepdown/ICU TEAM   Becky Collins ZOX:[id number] DOB: Apr 26, 1932 DOA: 07/19/2013 PCP: Selinda Flavin, MD  Brief narrative: 79 year old female with a history of severe CAD, diabetes type 2, hypertension, recent pacemaker who had initially presented to [facility] and then transferred to [facility] on 07/19/2013 to the [facility] Cardiology service for NSTEMI. Patient was medically managed with heparin drip, nitro drip, aspirin, beta blocker, and statins with plans for cardiac catheterization. Patient was noted to be confused after her transfer, with sx being worse at night.  She had a CT head which was negative for stroke but did show age-related atrophy, chronic microvascular ischemia and remote right parietal infarct. The patient's daughter reported that at her baseline her mental status is fine and she is functional with her ADLs with no recent memory changes. She however did report that she has a history of frequent UTIs, was on Septra daily for prophylaxis until August, and since then she has had 2 UTIs. Last UTI episode was in August with Citrobacter freundii. Patient's daughter also reported that she had been on Depakote for 1 (reported as generalized epileptic) seizure episode several years ago and Depakote was stopped in August this year. Patient's daughter reported that she has been fine and had no repeat seizures since then. Patient's daughter did mention that she had noticed transient right lower extremity weakness for 3 days.   Assessment/Plan:  NSTEMI (non-ST elevated myocardial infarction) managed by Cardiology/primary service   Acute encephalopathy idiopathic - patient's daughter states that the patient usually does not get confused when she has urinary tract infections - CT of the head negative for acute findings - B12 normal - TSH was normal in September - Ammonia level was normal in Aug - this may simply represent sundowning or it  could be her first episode of UTI associated delirium - patient admits to sleeping very poorly so sleep deprivation can also be adding to the problem - low-dose nightly Seroquel to continue only while in hospital - this issue appears to be greatly improved   UTI Culture not helpful in identifying the offending pathogen - UA is c/w UTI - cont empiric Rocephin and follow clinically - repeat UA today - of note pt is MRSA screen negative - most recent helpful culture data from Aug + for Citrobacter which was sensitive to Rocephin - can transition to ceftin at time of d/c to complete full 10 days of tx   HTN  Reasonably controlled at this time  DM CBG much improved - no change in tx plan today - A1c10.4 revealing poor control at home   History of seizure disorder Depakote was discontinued many months ago as patient has been seizure free  CKD stage III Baseline crt appears to be ~1.5-1.6 - renal function is stable/currently better than apparent baseline  Pacemaker  Code Status: FULL Family Communication: spoke w/ pt and son at bedside   Antibiotics: Rocephin 10/13 >>  HPI/Subjective: The patient is alert and oriented.  She denies f/c, sob, n/v, or abdom pain. Her son confirms that her MS is now essentially back to baseline.  Objective: Blood pressure 140/59, pulse 79, temperature 98.7 F (37.1 C), temperature source Oral, resp. rate 15, height 5\' 3"  (1.6 m), weight 89.7 kg (197 lb 12 oz), SpO2 99.00%.  Intake/Output Summary (Last 24 hours) at 07/26/13 1307 Last data filed at 07/26/13 0900  Gross per 24 hour  Intake    580 ml  Output    600 ml  Net    -20 ml   Exam: General: No acute respiratory distress Lungs: Clear to auscultation bilaterally without wheezes or crackles Cardiovascular: Regular rate and rhythm - no appreciable M Abdomen: nontender, nondistended, soft, bowel sounds positive, no rebound, no ascites, no appreciable mass Extremities: No significant cyanosis,  clubbing, or edema bilateral lower extremities  Data Reviewed: Basic Metabolic Panel:  Recent Labs Lab 07/20/13 0600 07/21/13 0348 07/22/13 0215 07/23/13 0430 07/25/13 0416  NA 140 131* 130* 137 139  K 3.7 4.4 4.2 4.3 4.0  CL 105 98 99 103 105  CO2 23 22 23 20 21   GLUCOSE 73 233* 246* 240* 163*  BUN 22 20 18 17 17   CREATININE 1.49* 1.44* 1.32* 1.29* 1.21*  CALCIUM 8.9 8.3* 8.3* 8.8 9.1   Liver Function Tests:  Recent Labs Lab 07/19/13 2310  AST 30  ALT 20  ALKPHOS 67  BILITOT 0.3  PROT 6.6  ALBUMIN 3.2*   CBC:  Recent Labs Lab 07/20/13 0600 07/26/13 0442  WBC 7.5 7.1  HGB 10.6* 10.2*  HCT 33.6* 33.4*  MCV 80.0 80.3  PLT 256 255   Cardiac Enzymes:  Recent Labs Lab 07/21/13 2027 07/22/13 0215 07/22/13 1215 07/22/13 1754 07/22/13 2310  TROPONINI <0.30 <0.30 <0.30 <0.30 <0.30   BNP (last 3 results)  Recent Labs  05/27/13 1050 07/19/13 2310  PROBNP 608.1* 1110.0*   CBG:  Recent Labs Lab 07/25/13 1213 07/25/13 1639 07/25/13 2151 07/26/13 0819 07/26/13 1153  GLUCAP 193* 119* 143* 178* 131*    Recent Results (from the past 240 hour(s))  MRSA PCR SCREENING     Status: None   Collection Time    07/20/13  4:16 AM      Result Value Range Status   MRSA by PCR NEGATIVE  NEGATIVE Final   Comment:            The GeneXpert MRSA Assay (FDA     approved for NASAL specimens     only), is one component of a     comprehensive MRSA colonization     surveillance program. It is not     intended to diagnose MRSA     infection nor to guide or     monitor treatment for     MRSA infections.  URINE CULTURE     Status: None   Collection Time    07/20/13  9:27 PM      Result Value Range Status   Specimen Description URINE, CLEAN CATCH   Final   Special Requests CX ADDED AT 2152 ON 161096   Final   Culture  Setup Time     Final   Value: 07/20/2013 22:04     Performed at [facility]   Colony Count     Final   Value: >=100,000 COLONIES/ML      Performed at [facility]   Culture     Final   Value: Multiple bacterial morphotypes present, none predominant. Suggest appropriate recollection if clinically indicated.     Performed at [facility]   Report Status 07/23/2013 FINAL   Final  CULTURE, BLOOD (ROUTINE X 2)     Status: None   Collection Time    07/20/13  9:30 PM      Result Value Range Status   Specimen Description BLOOD LEFT ARM   Final   Special Requests BOTTLES DRAWN AEROBIC ONLY 10CC   Final   Culture  Setup  Time     Final   Value: 07/21/2013 14:33     Performed at [facility]   Culture     Final   Value:        BLOOD CULTURE RECEIVED NO GROWTH TO DATE CULTURE WILL BE HELD FOR 5 DAYS BEFORE ISSUING A FINAL NEGATIVE REPORT     Performed at [facility]   Report Status PENDING   Incomplete  CULTURE, BLOOD (ROUTINE X 2)     Status: None   Collection Time    07/20/13  9:43 PM      Result Value Range Status   Specimen Description BLOOD LEFT HAND   Final   Special Requests BOTTLES DRAWN AEROBIC AND ANAEROBIC 5CC EACH   Final   Culture  Setup Time     Final   Value: 07/21/2013 14:33     Performed at [facility]   Culture     Final   Value:        BLOOD CULTURE RECEIVED NO GROWTH TO DATE CULTURE WILL BE HELD FOR 5 DAYS BEFORE ISSUING A FINAL NEGATIVE REPORT     Performed at [facility]   Report Status PENDING   Incomplete  URINE CULTURE     Status: None   Collection Time    07/23/13  9:52 AM      Result Value Range Status   Specimen Description URINE, RANDOM   Final   Special Requests NONE   Final   Culture  Setup Time     Final   Value: 07/23/2013 15:20     Performed at [facility] Count     Final   Value: 9,000 COLONIES/ML     Performed at [facility]   Culture     Final   Value: INSIGNIFICANT GROWTH     Performed at [facility]   Report Status 07/24/2013 FINAL   Final     Studies:  Recent x-ray studies have been  reviewed in detail by the Attending Physician  Scheduled Meds:  Scheduled Meds: . acetaminophen  1,000 mg Oral TID  . amLODipine  5 mg Oral Daily  . antiseptic oral rinse  15 mL Mouth Rinse BID  . aspirin EC  81 mg Oral Daily  . atorvastatin  20 mg Oral q1800  . brimonidine  1 drop Both Eyes BID  . cefTRIAXone (ROCEPHIN)  IV  1 g Intravenous Q24H  . insulin aspart  0-15 Units Subcutaneous TID WC  . insulin aspart  6 Units Subcutaneous TID WC  . insulin glargine  54 Units Subcutaneous QHS  . metoprolol tartrate  25 mg Oral BID  . nitroGLYCERIN  2 inch Topical Q6H  . pantoprazole  40 mg Oral Daily  . polyethylene glycol  17 g Oral BID  . QUEtiapine  12.5 mg Oral QHS  . sertraline  100 mg Oral q morning - 10a    Time spent on care of this patient: 25 minutes   Ignatz Deis T, MD  [facility] Office  [phone] Pager - Text Page per Loretha Stapler as per below:  On-Call/Text Page:      [email or web address]      password [email or web address]  If 7PM-7AM, please contact night-coverage [email or web address] Password [email or web address] 07/26/2013, 1:07 PM   LOS: 7 days

## 2013-07-26 NOTE — Progress Notes (Signed)
Subjective:  Patient has remained stable overnight. Tolerating activity in room. VS stable.  Complains of right sided anterior chest pain. Not pleuritic. Has not walked in hall yet.  Objective:  Vital Signs in the last 24 hours: Temp:  [98 F (36.7 C)-98.7 F (37.1 C)] 98.7 F (37.1 C) (10/17 0435) Pulse Rate:  [82-85] 85 (10/17 0008) Resp:  [17-23] 17 (10/17 0440) BP: (129-146)/(58-86) 144/80 mmHg (10/17 0440) SpO2:  [95 %-100 %] 98 % (10/17 0008)  Intake/Output from previous day: 10/16 0701 - 10/17 0700 In: 832 [P.O.:720; I.V.:12; IV Piggyback:100] Out: 300 [Urine:300] Intake/Output from this shift:    . acetaminophen  1,000 mg Oral TID  . amLODipine  5 mg Oral Daily  . antiseptic oral rinse  15 mL Mouth Rinse BID  . aspirin EC  81 mg Oral Daily  . atorvastatin  20 mg Oral q1800  . brimonidine  1 drop Both Eyes BID  . cefTRIAXone (ROCEPHIN)  IV  1 g Intravenous Q24H  . insulin aspart  0-15 Units Subcutaneous TID WC  . insulin aspart  6 Units Subcutaneous TID WC  . insulin glargine  54 Units Subcutaneous QHS  . metoprolol tartrate  25 mg Oral BID  . nitroGLYCERIN  2 inch Topical Q6H  . pantoprazole  40 mg Oral Daily  . polyethylene glycol  17 g Oral BID  . QUEtiapine  12.5 mg Oral QHS  . sertraline  100 mg Oral q morning - 10a   . sodium chloride Stopped (07/20/13 1600)    Physical Exam: The patient appears to be in no distress.  Head and neck exam reveals that the pupils are equal and reactive.  The extraocular movements are full.  There is no scleral icterus.  Mouth and pharynx are benign.  No lymphadenopathy.  No carotid bruits.  The jugular venous pressure is normal.  Thyroid is not enlarged or tender.  Chest reveals decreased breath sounds on right. No recent chest xray in Epic. Heart reveals no abnormal lift or heave.  First and second heart sounds are normal.  There is no murmur gallop rub or click.  The abdomen is soft and nontender.  Bowel sounds  are normoactive.  There is no hepatosplenomegaly or mass.  There are no abdominal bruits.  Extremities reveal no phlebitis or edema.  Pedal pulses are good.  There is no cyanosis or clubbing.  Neurologic exam is normal strength and no lateralizing weakness.  No sensory deficits.  Integument reveals no rash  Lab Results:  Recent Labs  07/26/13 0442  WBC 7.1  HGB 10.2*  PLT 255    Recent Labs  07/25/13 0416  NA 139  K 4.0  CL 105  CO2 21  GLUCOSE 163*  BUN 17  CREATININE 1.21*   No results found for this basename: TROPONINI, CK, MB,  in the last 72 hours Hepatic Function Panel No results found for this basename: PROT, ALBUMIN, AST, ALT, ALKPHOS, BILITOT, BILIDIR, IBILI,  in the last 72 hours No results found for this basename: CHOL,  in the last 72 hours No results found for this basename: PROTIME,  in the last 72 hours  Imaging: No results found.  Cardiac Studies:  Assessment/Plan:  Principal Problem:  NSTEMI (non-ST elevated myocardial infarction)  Active Problems:  HTN (hypertension)  DM (diabetes mellitus)  Ejection fraction  Mitral regurgitation  History of seizure disorder  CKD (chronic kidney disease), stage III  CAD (coronary artery disease)  Pacemaker  Abnormal  mini-mental status exam  Acute encephalopathy  Plan:  Chest xray today. Physical therapy consult today.  Possibly home over weekend if stable.  LOS: 7 days    Cassell Clement 07/26/2013, 8:21 AM

## 2013-07-27 LAB — GLUCOSE, CAPILLARY

## 2013-07-27 LAB — CULTURE, BLOOD (ROUTINE X 2): Culture: NO GROWTH

## 2013-07-27 MED ORDER — CEFUROXIME AXETIL 500 MG PO TABS
500.0000 mg | ORAL_TABLET | Freq: Two times a day (BID) | ORAL | Status: DC
  Filled 2013-07-27 (×2): qty 1

## 2013-07-27 MED ORDER — INSULIN GLARGINE 100 UNIT/ML ~~LOC~~ SOLN: 54.0000 [IU] | Freq: Every day | SUBCUTANEOUS | Status: DC

## 2013-07-27 MED ORDER — CEFUROXIME AXETIL 500 MG PO TABS: 500.0000 mg | ORAL_TABLET | Freq: Two times a day (BID) | ORAL | Status: DC

## 2013-07-27 MED ORDER — INSULIN ASPART 100 UNIT/ML ~~LOC~~ SOLN: 6.0000 [IU] | Freq: Three times a day (TID) | SUBCUTANEOUS | Status: DC

## 2013-07-27 MED ORDER — ISOSORBIDE MONONITRATE ER 30 MG PO TB24: 30.0000 mg | ORAL_TABLET | Freq: Every day | ORAL | Status: AC

## 2013-07-27 MED ORDER — SIMVASTATIN 40 MG PO TABS: 40.0000 mg | ORAL_TABLET | Freq: Every day | ORAL | Status: DC

## 2013-07-27 NOTE — Discharge Summary (Signed)
CARDIOLOGY DISCHARGE SUMMARY   Patient ID: Becky Collins MRN: [id number] DOB/AGE: 01-02-1932 79 y.o.  Admit date: 07/19/2013 Discharge date: 07/27/2013  Primary Discharge Diagnosis:   NSTEMI (non-ST elevated myocardial infarction) - medical therapy for CAD  Secondary Discharge Diagnosis:    HTN (hypertension)   DM (diabetes mellitus), type 2, uncontrolled   Mitral regurgitation   History of seizure disorder   CKD (chronic kidney disease), stage III   CAD (coronary artery disease)   Pacemaker   Abnormal mini-mental status exam   Acute encephalopathy   UTI    Anemia  Consults: Internal medicine  Procedures: CT of the head without contrast, 2-D echocardiogram  Hospital Course: Becky Collins is a 79 y.o. female with a history of CAD, recent pacemaker and recent cath (medical therapy). She went to [facility] with shortness of breath and chest pain that radiated to her left shoulder. Her initial enzymes were slightly elevated so she was transferred to [facility] for further evaluation and treatment.  Her initial troponin was slightly elevated at 0.39 the followup troponins were negative. Her pain improved with with aspirin, heparin, nitrates and morphine. She had some problems with lethargy so the morphine dose was decreased. She developed some possible right lower extremity weakness.   With the mental status changes and possible unilateral weakness, she had a CT of the head. She cannot have an MRI due to her recent pacemaker implantation. The CT of the head showed no acute changes. Ativan and gabapentin were discontinued. She had blood and urine cultures performed. The urine culture showed multiple bacterial species, the blood cultures are negative so far. She had no fever and her white count was not elevated.  Her mental status returned to baseline. Her cath films were reviewed. Percutaneous intervention was considered, but her pain was not clearly angina (worse with  movement) and was responding to pain control medications. Because the pain was not clearly cardiac, and the percutaneous intervention would be complex with significant likelihood of restenosis, PCI was deferred. It can be reconsidered if she develops recurrent/unrelenting symptoms. Because of her mental status changes, narcotics are not recommended for treatment of her chest pain. She was put on scheduled Tylenol and improved.  An Internal medicine consult was called to further evaluate her mental status changes. Further testing was performed including a repeat urine culture, serum osmolality, urine sodium and urine creatinine. She also had a B12, a vitamin D level and an ammonia level drawn. These labs have been within normal limits. Her urine osmolality was slightly low. Recent TSH was normal. Empiric antibiotics for a UTI were initiated. She is to complete antibiotic therapy as an outpatient.  Her hemoglobin A1c was significantly elevated at 10.4. Her medications were adjusted and dietary compliance is encouraged. She is to follow up with her primary care physician for this.  She was anemic on admission and her blood counts were followed during her hospital stay. She had no significant change in her hemoglobin and hematocrit during her stay. Her MCV is within normal limits. She had no bleeding issues.  She was felt to have acute encephalopathy that was idiopathic. She may have had mental status changes from her UTI. There is possibly sundowning as well as sleep deprivation. She was on Seroquel while in the hospital but will not be discharged on this. If problems with sleep continue as an outpatient, she is to followup with her primary care physician.  Her condition gradually improved. She began  ambulating with staff. She lives with family who can help supervise the patient and help make sure she gets her medications.  On 07/27/2013, Becky Collins was seen by Dr. Eden Emms. She was feeling better and her  chest pain was felt to be controlled. Her mental status had improved. Dr. Eden Emms evaluated Becky Collins and reviewed all data. He considers her stable for discharge, to follow up as an outpatient.  Labs:  Lab Results  Component Value Date   WBC 7.1 07/26/2013   HGB 10.2* 07/26/2013   HCT 33.4* 07/26/2013   MCV 80.3 07/26/2013   PLT 255 07/26/2013     Recent Labs Lab 07/25/13 0416  NA 139  K 4.0  CL 105  CO2 21  BUN 17  CREATININE 1.21*  CALCIUM 9.1  GLUCOSE 163*   Lab Results  Component Value Date   TROPONINI <0.30 07/22/2013   Pro B Natriuretic peptide (BNP)  Date/Time Value Range Status  07/19/2013 11:10 PM 1110.0* 0 - 450 pg/mL Final  05/27/2013 10:50 AM 608.1* 0 - 450 pg/mL Final   Lab Results  Component Value Date   HGBA1C 10.4* 07/23/2013      Radiology: Dg Chest 2 View 07/26/2013   CLINICAL DATA:  Decreased breath sounds at the right base.  EXAM: CHEST  2 VIEW  COMPARISON:  Multiple priors  FINDINGS: Cardiac pacing device is unchanged. The cardiomediastinal silhouette is unchanged. No frank edema. Bibasilar opacities. Low lung volumes. No pneumothorax. Bilateral small pleural effusions.  IMPRESSION: Bibasilar opacities, likely representing atelectasis. Developing infection is not excluded. Small bilateral pleural effusions.   Electronically Signed   By: Jerene Dilling M.D.   On: 07/26/2013 09:47   Ct Head Wo Contrast 07/22/2013   CLINICAL DATA:  History of hypertension, diabetes, and heart disease. Confusion.  EXAM: CT HEAD WITHOUT CONTRAST  TECHNIQUE: Contiguous axial images were obtained from the base of the skull through the vertex without contrast.  COMPARISON:  07/20/2013.  FINDINGS: Stable atrophy and chronic microvascular ischemic change. No evidence for acute infarction, hemorrhage, mass lesion, hydrocephalus, or extra-axial fluid. Calvarium intact. Advanced vascular calcification. Minimal sinus fluid is stable. No mastoid inflammatory process.  IMPRESSION:  Stable atrophy and chronic microvascular ischemic change. No acute intracranial findings.   Electronically Signed   By: Davonna Belling M.D.   On: 07/22/2013 18:27   Ct Head Wo Contrast 07/20/2013   *RADIOLOGY REPORT*  Clinical Data: Confusion  CT HEAD WITHOUT CONTRAST  Technique:  Contiguous axial images were obtained from the base of the skull through the vertex without contrast.  Comparison: Prior MRI from 08/16/2011  Findings: Mild prominence of the CSF containing spaces was compatible with generalized atrophy. Scattered and confluent hypodensities within the periventricular deep white matter most compatible with chronic microvascular ischemic disease.  Remote right parietal infarct is noted, unchanged as compared to prior MRI.  No acute intracranial hemorrhage is identified.  There is no large vessel territory infarct.  No extra-axial fluid collection. No midline shift or mass lesion.  Calvarium is intact.  Orbital soft tissues are within normal limits.  Paranasal sinuses and mastoid air cells are clear.  IMPRESSION: 1. No acute intracranial process.  2. Unchanged remote right parietal infarct.  3. Mild age related atrophy and chronic microvascular ischemic disease, similar to prior.   Original Report Authenticated By: Rise Mu, M.D.   EKG:  Sinus rhythm, ventricular pacing, rate 91  Echo: 07/22/2013 Study Conclusions - Left ventricle: The cavity size was normal. Wall thickness was  increased in a pattern of moderate LVH. Systolic function was normal. The estimated ejection fraction was in the range of 50% to 55%. Moderate hypokinesis of the basalinferior myocardium. - Mitral valve: Moderate regurgitation. - Left atrium: The atrium was mildly dilated.  FOLLOW UP PLANS AND APPOINTMENTS No Known Allergies   Medication List    STOP taking these medications       gabapentin 100 MG capsule  Commonly known as:  NEURONTIN     glimepiride 2 MG tablet  Commonly known as:  AMARYL        TAKE these medications       amLODipine 5 MG tablet  Commonly known as:  NORVASC  Take 1 tablet (5 mg total) by mouth daily.     aspirin 81 MG tablet  Take 81 mg by mouth at bedtime.     brimonidine 0.2 % ophthalmic solution  Commonly known as:  ALPHAGAN  Place 1 drop into both eyes 2 (two) times daily.     calcium-vitamin D 500-200 MG-UNIT per tablet  Commonly known as:  OSCAL WITH D  Take 1 tablet by mouth.     cefUROXime 500 MG tablet  Commonly known as:  CEFTIN  Take 1 tablet (500 mg total) by mouth 2 (two) times daily with a meal.     insulin aspart 100 UNIT/ML injection  Commonly known as:  novoLOG  Inject 6 Units into the skin 3 (three) times daily with meals.     insulin glargine 100 UNIT/ML injection  Commonly known as:  LANTUS  Inject 0.54 mLs (54 Units total) into the skin at bedtime.     isosorbide mononitrate 30 MG 24 hr tablet  Commonly known as:  IMDUR  Take 1 tablet (30 mg total) by mouth daily.     LORazepam 0.5 MG tablet  Commonly known as:  ATIVAN  Take 1 tablet (0.5 mg total) by mouth daily as needed. For anxiety     metoprolol tartrate 25 MG tablet  Commonly known as:  LOPRESSOR  Take 1 tablet (25 mg total) by mouth 2 (two) times daily.     MULTIPLE VITAMIN PO  Take by mouth daily.     omeprazole 20 MG capsule  Commonly known as:  PRILOSEC  Take 20 mg by mouth every morning.     pantoprazole 40 MG tablet  Commonly known as:  PROTONIX  Take 40 mg by mouth daily.     sertraline 100 MG tablet  Commonly known as:  ZOLOFT  Take 100 mg by mouth every morning.     simvastatin 40 MG tablet  Commonly known as:  ZOCOR  Take 1 tablet (40 mg total) by mouth at bedtime.     Vitamin D-3 1000 UNITS Caps  Take by mouth daily.        Discharge Orders   Future Appointments Provider Department Dept Phone   09/16/2013 1:45 PM Marinus Maw, MD [facility] [phone]   Future Orders Complete By Expires   Diet - low sodium heart  healthy  As directed    Increase activity slowly  As directed      Follow-up Information   Schedule an appointment as soon as possible for a visit with Selinda Flavin, MD.   Specialty:  Family Medicine   Contact information:   [address] [place] [address] [phone]       Follow up with Willa Rough, MD. (The office will call.)    Specialty:  Cardiology   Contact information:   [address]       BRING ALL MEDICATIONS WITH YOU TO FOLLOW UP APPOINTMENTS  Time spent with patient to include physician time: 47 min Signed: Theodore Demark, PA-C 07/27/2013, 2:52 PM Co-Sign MD

## 2013-07-27 NOTE — Progress Notes (Signed)
[facility] CONSULT Note Becky Collins TEAM 1 - Stepdown/ICU TEAM   Becky Collins ZOX:[id number] DOB: 1932-09-09 DOA: 07/19/2013 PCP: Selinda Flavin, MD  Brief narrative: 79 year old female with a history of severe CAD, diabetes type 2, hypertension, recent pacemaker who had initially presented to [facility] and then transferred to [facility] on 07/19/2013 to the [facility] Cardiology service for NSTEMI. Patient was medically managed with heparin drip, nitro drip, aspirin, beta blocker, and statins with plans for cardiac catheterization. Patient was noted to be confused after her transfer, with sx being worse at night.  She had a CT head which was negative for stroke but did show age-related atrophy, chronic microvascular ischemia and remote right parietal infarct. The patient's daughter reported that at her baseline her mental status is fine and she is functional with her ADLs with no recent memory changes. She however did report that she has a history of frequent UTIs, was on Septra daily for prophylaxis until August, and since then she has had 2 UTIs. Last UTI episode was in August with Citrobacter freundii. Patient's daughter also reported that she had been on Depakote for 1 (reported as generalized epileptic) seizure episode several years ago and Depakote was stopped in August this year. Patient's daughter reported that she has been fine and had no repeat seizures since then. Patient's daughter did mention that she had noticed transient right lower extremity weakness for 3 days.   Assessment/Plan:  NSTEMI (non-ST elevated myocardial infarction) managed by Cardiology/primary service   Acute encephalopathy idiopathic - patient's daughter states that the patient usually does not get confused when she has urinary tract infections - CT of the head negative for acute findings - B12 normal - TSH was normal in September - Ammonia level was normal in Aug - this may simply represent sundowning or it  could be her first episode of UTI associated delirium - patient admits to sleeping very poorly so sleep deprivation can also be adding to the problem - low-dose nightly Seroquel to continue only while in hospital - this issue appears to have resolved at this time   UTI Culture not helpful in identifying the offending pathogen - UA is c/w UTI - empiric Rocephin dosed during hospital stay - repeat UA clean - of note pt is MRSA screen negative - most recent helpful culture data from Aug + for Citrobacter which was sensitive to Rocephin - transition to ceftin at time of d/c to complete full 10 days of tx   HTN  Reasonably controlled at this time  DM CBG much improved - no change in tx plan - A1c10.4 revealing poor control at home   History of seizure disorder Depakote was discontinued many months ago as patient has been seizure free  CKD stage III Baseline crt appears to be ~1.5-1.6 - renal function is stable/currently better than apparent baseline  Pacemaker  Code Status: FULL Family Communication: spoke w/ pt and son at bedside   Antibiotics: Rocephin 10/13 >> Ceftin at time of d/c   HPI/Subjective: The patient is alert and oriented.  Her son confirms that her MS is stable and now back to normal.  Objective: Blood pressure 128/85, pulse 76, temperature 97.2 F (36.2 C), temperature source Oral, resp. rate 17, height 5\' 3"  (1.6 m), weight 89.7 kg (197 lb 12 oz), SpO2 95.00%.  Intake/Output Summary (Last 24 hours) at 07/27/13 1042 Last data filed at 07/27/13 0700  Gross per 24 hour  Intake    660 ml  Output  1200 ml  Net   -540 ml   Exam: General: No acute respiratory distress Lungs: Clear to auscultation bilaterally without wheezes or crackles Cardiovascular: Regular rate and rhythm - no appreciable M Abdomen: nontender, nondistended, soft, bowel sounds positive, no rebound, no ascites, no appreciable mass Extremities: No significant cyanosis, clubbing, or edema bilateral  lower extremities  Data Reviewed: Basic Metabolic Panel:  Recent Labs Lab 07/21/13 0348 07/22/13 0215 07/23/13 0430 07/25/13 0416  NA 131* 130* 137 139  K 4.4 4.2 4.3 4.0  CL 98 99 103 105  CO2 22 23 20 21   GLUCOSE 233* 246* 240* 163*  BUN 20 18 17 17   CREATININE 1.44* 1.32* 1.29* 1.21*  CALCIUM 8.3* 8.3* 8.8 9.1   Liver Function Tests: No results found for this basename: AST, ALT, ALKPHOS, BILITOT, PROT, ALBUMIN,  in the last 168 hours CBC:  Recent Labs Lab 07/26/13 0442  WBC 7.1  HGB 10.2*  HCT 33.4*  MCV 80.3  PLT 255   Cardiac Enzymes:  Recent Labs Lab 07/21/13 2027 07/22/13 0215 07/22/13 1215 07/22/13 1754 07/22/13 2310  TROPONINI <0.30 <0.30 <0.30 <0.30 <0.30   BNP (last 3 results)  Recent Labs  05/27/13 1050 07/19/13 2310  PROBNP 608.1* 1110.0*   CBG:  Recent Labs Lab 07/26/13 0819 07/26/13 1153 07/26/13 1650 07/26/13 2129 07/27/13 0822  GLUCAP 178* 131* 100* 128* 162*    Recent Results (from the past 240 hour(s))  MRSA PCR SCREENING     Status: None   Collection Time    07/20/13  4:16 AM      Result Value Range Status   MRSA by PCR NEGATIVE  NEGATIVE Final   Comment:            The GeneXpert MRSA Assay (FDA     approved for NASAL specimens     only), is one component of a     comprehensive MRSA colonization     surveillance program. It is not     intended to diagnose MRSA     infection nor to guide or     monitor treatment for     MRSA infections.  URINE CULTURE     Status: None   Collection Time    07/20/13  9:27 PM      Result Value Range Status   Specimen Description URINE, CLEAN CATCH   Final   Special Requests CX ADDED AT 2152 ON [id number]   Final   Culture  Setup Time     Final   Value: 07/20/2013 22:04     Performed at [facility]   Colony Count     Final   Value: >=100,000 COLONIES/ML     Performed at [facility]   Culture     Final   Value: Multiple bacterial morphotypes present, none  predominant. Suggest appropriate recollection if clinically indicated.     Performed at [facility]   Report Status 07/23/2013 FINAL   Final  CULTURE, BLOOD (ROUTINE X 2)     Status: None   Collection Time    07/20/13  9:30 PM      Result Value Range Status   Specimen Description BLOOD LEFT ARM   Final   Special Requests BOTTLES DRAWN AEROBIC ONLY 10CC   Final   Culture  Setup Time     Final   Value: 07/21/2013 14:33     Performed at [facility]   Culture     Final  Value:        BLOOD CULTURE RECEIVED NO GROWTH TO DATE CULTURE WILL BE HELD FOR 5 DAYS BEFORE ISSUING A FINAL NEGATIVE REPORT     Performed at [facility]   Report Status PENDING   Incomplete  CULTURE, BLOOD (ROUTINE X 2)     Status: None   Collection Time    07/20/13  9:43 PM      Result Value Range Status   Specimen Description BLOOD LEFT HAND   Final   Special Requests BOTTLES DRAWN AEROBIC AND ANAEROBIC 5CC EACH   Final   Culture  Setup Time     Final   Value: 07/21/2013 14:33     Performed at [facility]   Culture     Final   Value:        BLOOD CULTURE RECEIVED NO GROWTH TO DATE CULTURE WILL BE HELD FOR 5 DAYS BEFORE ISSUING A FINAL NEGATIVE REPORT     Performed at [facility]   Report Status PENDING   Incomplete  URINE CULTURE     Status: None   Collection Time    07/23/13  9:52 AM      Result Value Range Status   Specimen Description URINE, RANDOM   Final   Special Requests NONE   Final   Culture  Setup Time     Final   Value: 07/23/2013 15:20     Performed at [facility] Count     Final   Value: 9,000 COLONIES/ML     Performed at [facility]   Culture     Final   Value: INSIGNIFICANT GROWTH     Performed at [facility]   Report Status 07/24/2013 FINAL   Final     Studies:  Recent x-ray studies have been reviewed in detail by the Attending Physician  Scheduled Meds:  Scheduled Meds: . acetaminophen  1,000 mg Oral  TID  . amLODipine  5 mg Oral Daily  . antiseptic oral rinse  15 mL Mouth Rinse BID  . aspirin EC  81 mg Oral Daily  . atorvastatin  20 mg Oral q1800  . brimonidine  1 drop Both Eyes BID  . cefTRIAXone (ROCEPHIN)  IV  1 g Intravenous Q24H  . insulin aspart  0-15 Units Subcutaneous TID WC  . insulin aspart  6 Units Subcutaneous TID WC  . insulin glargine  54 Units Subcutaneous QHS  . metoprolol tartrate  25 mg Oral BID  . nitroGLYCERIN  2 inch Topical Q6H  . pantoprazole  40 mg Oral Daily  . polyethylene glycol  17 g Oral BID  . QUEtiapine  12.5 mg Oral QHS  . sertraline  100 mg Oral q morning - 10a    Time spent on care of this patient: 15 minutes   Becky Collins T, MD  [facility] Office  [phone] Pager - Text Page per Loretha Stapler as per below:  On-Call/Text Page:      [email or web address]      password [email or web address]  If 7PM-7AM, please contact night-coverage [email or web address] Password [email or web address] 07/27/2013, 10:42 AM   LOS: 8 days

## 2013-07-27 NOTE — Progress Notes (Addendum)
D/c instructions reviewed with pt and her son. Copy of instructions reviewed. All questions answered. Copy of instructions given to pt/son, scripts sent electronically to pt's pharmacy per PA, pt informed. Pt d/c via wheelchair with son and belongings, escorted by unit staff.

## 2013-07-27 NOTE — Progress Notes (Signed)
Patient ID: Becky Collins, female   DOB: Sep 10, 1932, 79 y.o.   MRN: [id number]     Subjective:  No complaints wants to go home 2nd anniversary of husbands death and family coming to her house in [place] Objective:  Vital Signs in the last 24 hours: Temp:  [97.2 F (36.2 C)-98.9 F (37.2 C)] 97.2 F (36.2 C) (10/18 0800) Pulse Rate:  [76-79] 76 (10/17 1644) Resp:  [15-21] 17 (10/18 0606) BP: (121-149)/(49-85) 128/85 mmHg (10/18 0606) SpO2:  [95 %-99 %] 95 % (10/18 0009)  Intake/Output from previous day: 10/17 0701 - 10/18 0700 In: 900 [P.O.:900] Out: 1500 [Urine:1500] Intake/Output from this shift:    . acetaminophen  1,000 mg Oral TID  . amLODipine  5 mg Oral Daily  . antiseptic oral rinse  15 mL Mouth Rinse BID  . aspirin EC  81 mg Oral Daily  . atorvastatin  20 mg Oral q1800  . brimonidine  1 drop Both Eyes BID  . cefTRIAXone (ROCEPHIN)  IV  1 g Intravenous Q24H  . insulin aspart  0-15 Units Subcutaneous TID WC  . insulin aspart  6 Units Subcutaneous TID WC  . insulin glargine  54 Units Subcutaneous QHS  . metoprolol tartrate  25 mg Oral BID  . nitroGLYCERIN  2 inch Topical Q6H  . pantoprazole  40 mg Oral Daily  . polyethylene glycol  17 g Oral BID  . QUEtiapine  12.5 mg Oral QHS  . sertraline  100 mg Oral q morning - 10a   . sodium chloride Stopped (07/20/13 1600)    Physical Exam: The patient appears to be in no distress.  Head and neck exam reveals that the pupils are equal and reactive.  The extraocular movements are full.  There is no scleral icterus.  Mouth and pharynx are benign.  No lymphadenopathy.  No carotid bruits.  The jugular venous pressure is normal.  Thyroid is not enlarged or tender.  Chest reveals decreased breath sounds on right. No recent chest xray in Epic. Heart reveals no abnormal lift or heave.  First and second heart sounds are normal.  There is no murmur gallop rub or click.  The abdomen is soft and nontender.  Bowel sounds are normoactive.   There is no hepatosplenomegaly or mass.  There are no abdominal bruits.  Extremities reveal no phlebitis or edema.  Pedal pulses are good.  There is no cyanosis or clubbing.  Neurologic exam is normal strength and no lateralizing weakness.  No sensory deficits.  Integument reveals no rash  Lab Results:  Recent Labs  07/26/13 0442  WBC 7.1  HGB 10.2*  PLT 255    Recent Labs  07/25/13 0416  NA 139  K 4.0  CL 105  CO2 21  GLUCOSE 163*  BUN 17  CREATININE 1.21*   No results found for this basename: TROPONINI, CK, MB,  in the last 72 hours Hepatic Function Panel No results found for this basename: PROT, ALBUMIN, AST, ALT, ALKPHOS, BILITOT, BILIDIR, IBILI,  in the last 72 hours No results found for this basename: CHOL,  in the last 72 hours No results found for this basename: PROTIME,  in the last 72 hours  Imaging: Dg Chest 2 View  07/26/2013   CLINICAL DATA:  Decreased breath sounds at the right base.  EXAM: CHEST  2 VIEW  COMPARISON:  Multiple priors  FINDINGS: Cardiac pacing device is unchanged. The cardiomediastinal silhouette is unchanged. No frank edema. Bibasilar opacities. Low lung volumes.  No pneumothorax. Bilateral small pleural effusions.  IMPRESSION: Bibasilar opacities, likely representing atelectasis. Developing infection is not excluded. Small bilateral pleural effusions.   Electronically Signed   By: Jerene Dilling M.D.   On: 07/26/2013 09:47    Cardiac Studies:  Assessment/Plan:  CAD:  Recent cath with diffuse disease  See notes from [facility]  Recommended medical Rx  D/C nitro paste d/c amlodipine start imdur 15 mg Continue beta blocker F/U in [place] with ? JK   MS:  Appreciate IM seeing back to baseline has good family support  DM:  Continue home insulin doses f/u primary    Charlton Haws 07/27/2013, 9:49 AM

## 2013-07-30 ENCOUNTER — Inpatient Hospital Stay (HOSPITAL_COMMUNITY)
Admission: EM | Admit: 2013-07-30 | Discharge: 2013-08-09 | DRG: 246 | Disposition: A | Discharge status: Skilled Nursing Facility | Payer: Medicare Other | Attending: Cardiology | Admitting: Cardiology

## 2013-07-30 ENCOUNTER — Encounter (HOSPITAL_COMMUNITY): Payer: Self-pay | Admitting: Emergency Medicine

## 2013-07-30 ENCOUNTER — Emergency Department (HOSPITAL_COMMUNITY): Payer: Medicare Other

## 2013-07-30 DIAGNOSIS — Z8673 Personal history of transient ischemic attack (TIA), and cerebral infarction without residual deficits: Secondary | ICD-10-CM

## 2013-07-30 DIAGNOSIS — Z8249 Family history of ischemic heart disease and other diseases of the circulatory system: Secondary | ICD-10-CM

## 2013-07-30 DIAGNOSIS — F329 Major depressive disorder, single episode, unspecified: Secondary | ICD-10-CM | POA: Diagnosis present

## 2013-07-30 DIAGNOSIS — F99 Mental disorder, not otherwise specified: Secondary | ICD-10-CM | POA: Diagnosis present

## 2013-07-30 DIAGNOSIS — I340 Nonrheumatic mitral (valve) insufficiency: Secondary | ICD-10-CM

## 2013-07-30 DIAGNOSIS — IMO0002 Reserved for concepts with insufficient information to code with codable children: Secondary | ICD-10-CM

## 2013-07-30 DIAGNOSIS — R748 Abnormal levels of other serum enzymes: Secondary | ICD-10-CM | POA: Diagnosis present

## 2013-07-30 DIAGNOSIS — E1129 Type 2 diabetes mellitus with other diabetic kidney complication: Secondary | ICD-10-CM

## 2013-07-30 DIAGNOSIS — Z950 Presence of cardiac pacemaker: Secondary | ICD-10-CM

## 2013-07-30 DIAGNOSIS — R0602 Shortness of breath: Secondary | ICD-10-CM

## 2013-07-30 DIAGNOSIS — Z91199 Patient's noncompliance with other medical treatment and regimen due to unspecified reason: Secondary | ICD-10-CM

## 2013-07-30 DIAGNOSIS — Z7982 Long term (current) use of aspirin: Secondary | ICD-10-CM

## 2013-07-30 DIAGNOSIS — I951 Orthostatic hypotension: Secondary | ICD-10-CM | POA: Diagnosis present

## 2013-07-30 DIAGNOSIS — I442 Atrioventricular block, complete: Secondary | ICD-10-CM

## 2013-07-30 DIAGNOSIS — I6529 Occlusion and stenosis of unspecified carotid artery: Secondary | ICD-10-CM | POA: Diagnosis present

## 2013-07-30 DIAGNOSIS — N390 Urinary tract infection, site not specified: Secondary | ICD-10-CM

## 2013-07-30 DIAGNOSIS — R1310 Dysphagia, unspecified: Secondary | ICD-10-CM | POA: Diagnosis present

## 2013-07-30 DIAGNOSIS — I129 Hypertensive chronic kidney disease with stage 1 through stage 4 chronic kidney disease, or unspecified chronic kidney disease: Secondary | ICD-10-CM | POA: Diagnosis present

## 2013-07-30 DIAGNOSIS — R079 Chest pain, unspecified: Secondary | ICD-10-CM

## 2013-07-30 DIAGNOSIS — I10 Essential (primary) hypertension: Secondary | ICD-10-CM

## 2013-07-30 DIAGNOSIS — I5033 Acute on chronic diastolic (congestive) heart failure: Secondary | ICD-10-CM

## 2013-07-30 DIAGNOSIS — R5381 Other malaise: Secondary | ICD-10-CM | POA: Diagnosis present

## 2013-07-30 DIAGNOSIS — I214 Non-ST elevation (NSTEMI) myocardial infarction: Secondary | ICD-10-CM

## 2013-07-30 DIAGNOSIS — N183 Chronic kidney disease, stage 3 unspecified: Secondary | ICD-10-CM

## 2013-07-30 DIAGNOSIS — Z8669 Personal history of other diseases of the nervous system and sense organs: Secondary | ICD-10-CM

## 2013-07-30 DIAGNOSIS — R7989 Other specified abnormal findings of blood chemistry: Secondary | ICD-10-CM

## 2013-07-30 DIAGNOSIS — Z955 Presence of coronary angioplasty implant and graft: Secondary | ICD-10-CM

## 2013-07-30 DIAGNOSIS — Z9119 Patient's noncompliance with other medical treatment and regimen: Secondary | ICD-10-CM

## 2013-07-30 DIAGNOSIS — F0390 Unspecified dementia without behavioral disturbance: Secondary | ICD-10-CM | POA: Diagnosis present

## 2013-07-30 DIAGNOSIS — I70219 Atherosclerosis of native arteries of extremities with intermittent claudication, unspecified extremity: Secondary | ICD-10-CM

## 2013-07-30 DIAGNOSIS — J9811 Atelectasis: Secondary | ICD-10-CM

## 2013-07-30 DIAGNOSIS — Z794 Long term (current) use of insulin: Secondary | ICD-10-CM

## 2013-07-30 DIAGNOSIS — K219 Gastro-esophageal reflux disease without esophagitis: Secondary | ICD-10-CM | POA: Diagnosis present

## 2013-07-30 DIAGNOSIS — R9430 Abnormal result of cardiovascular function study, unspecified: Secondary | ICD-10-CM | POA: Diagnosis present

## 2013-07-30 DIAGNOSIS — D631 Anemia in chronic kidney disease: Secondary | ICD-10-CM | POA: Diagnosis present

## 2013-07-30 DIAGNOSIS — F3289 Other specified depressive episodes: Secondary | ICD-10-CM | POA: Diagnosis present

## 2013-07-30 DIAGNOSIS — I509 Heart failure, unspecified: Secondary | ICD-10-CM | POA: Diagnosis present

## 2013-07-30 DIAGNOSIS — Z7902 Long term (current) use of antithrombotics/antiplatelets: Secondary | ICD-10-CM

## 2013-07-30 DIAGNOSIS — E1165 Type 2 diabetes mellitus with hyperglycemia: Secondary | ICD-10-CM | POA: Diagnosis present

## 2013-07-30 DIAGNOSIS — Z79899 Other long term (current) drug therapy: Secondary | ICD-10-CM

## 2013-07-30 DIAGNOSIS — N058 Unspecified nephritic syndrome with other morphologic changes: Secondary | ICD-10-CM | POA: Diagnosis present

## 2013-07-30 DIAGNOSIS — K5900 Constipation, unspecified: Secondary | ICD-10-CM

## 2013-07-30 DIAGNOSIS — N179 Acute kidney failure, unspecified: Secondary | ICD-10-CM

## 2013-07-30 DIAGNOSIS — G40909 Epilepsy, unspecified, not intractable, without status epilepticus: Secondary | ICD-10-CM | POA: Diagnosis present

## 2013-07-30 DIAGNOSIS — R0789 Other chest pain: Secondary | ICD-10-CM

## 2013-07-30 DIAGNOSIS — I2510 Atherosclerotic heart disease of native coronary artery without angina pectoris: Principal | ICD-10-CM

## 2013-07-30 DIAGNOSIS — E785 Hyperlipidemia, unspecified: Secondary | ICD-10-CM

## 2013-07-30 DIAGNOSIS — I200 Unstable angina: Secondary | ICD-10-CM | POA: Diagnosis present

## 2013-07-30 DIAGNOSIS — J90 Pleural effusion, not elsewhere classified: Secondary | ICD-10-CM

## 2013-07-30 DIAGNOSIS — G9340 Encephalopathy, unspecified: Secondary | ICD-10-CM

## 2013-07-30 DIAGNOSIS — I059 Rheumatic mitral valve disease, unspecified: Secondary | ICD-10-CM | POA: Diagnosis present

## 2013-07-30 LAB — URINE CULTURE

## 2013-07-30 LAB — COMPREHENSIVE METABOLIC PANEL
ALT: 21 U/L (ref 0–35)
AST: 32 U/L (ref 0–37)
Albumin: 3.3 g/dL — ABNORMAL LOW (ref 3.5–5.2)
BUN: 29 mg/dL — ABNORMAL HIGH (ref 6–23)
CO2: 21 mEq/L (ref 19–32)
Calcium: 10.3 mg/dL (ref 8.4–10.5)
Chloride: 103 mEq/L (ref 96–112)
GFR calc non Af Amer: 34 mL/min — ABNORMAL LOW (ref >90)
Sodium: 137 mEq/L (ref 135–145)
Total Protein: 7.3 g/dL (ref 6.0–8.3)

## 2013-07-30 LAB — CBC WITH DIFFERENTIAL/PLATELET
Basophils Relative: 0 % (ref 0–1)
Eosinophils Relative: 4 % (ref 0–5)
HCT: 34.6 % — ABNORMAL LOW (ref 36.0–46.0)
Hemoglobin: 10.8 g/dL — ABNORMAL LOW (ref 12.0–15.0)
Lymphocytes Relative: 21 % (ref 12–46)
Monocytes Absolute: 1 10*3/uL (ref 0.1–1.0)
Monocytes Relative: 10 % (ref 3–12)
Neutro Abs: 6.4 10*3/uL (ref 1.7–7.7)
RBC: 4.34 MIL/uL (ref 3.87–5.11)
WBC: 9.9 10*3/uL (ref 4.0–10.5)

## 2013-07-30 LAB — URINALYSIS, ROUTINE W REFLEX MICROSCOPIC
Bilirubin Urine: NEGATIVE
Hgb urine dipstick: NEGATIVE
Nitrite: NEGATIVE
Protein, ur: NEGATIVE mg/dL
Specific Gravity, Urine: 1.011 (ref 1.005–1.030)
pH: 5 (ref 5.0–8.0)

## 2013-07-30 LAB — CG4 I-STAT (LACTIC ACID): Lactic Acid, Venous: 1.76 mmol/L (ref 0.5–2.2)

## 2013-07-30 LAB — URINE MICROSCOPIC-ADD ON

## 2013-07-30 LAB — TROPONIN I: Troponin I: 0.97 ng/mL (ref <0.30)

## 2013-07-30 LAB — PRO B NATRIURETIC PEPTIDE: Pro B Natriuretic peptide (BNP): 2555 pg/mL — ABNORMAL HIGH (ref 0–450)

## 2013-07-30 LAB — PROTIME-INR
INR: 1.26 (ref 0.00–1.49)
Prothrombin Time: 15.5 seconds — ABNORMAL HIGH (ref 11.6–15.2)

## 2013-07-30 MED ORDER — SIMVASTATIN 40 MG PO TABS
40.0000 mg | ORAL_TABLET | Freq: Every day | ORAL | Status: DC
  Administered 2013-07-30: 40 mg via ORAL
  Filled 2013-07-30 (×2): qty 1

## 2013-07-30 MED ORDER — PANTOPRAZOLE SODIUM 40 MG PO TBEC
40.0000 mg | DELAYED_RELEASE_TABLET | Freq: Every day | ORAL | Status: DC
  Administered 2013-07-30 – 2013-08-04 (×6): 40 mg via ORAL
  Filled 2013-07-30 (×6): qty 1

## 2013-07-30 MED ORDER — SERTRALINE HCL 100 MG PO TABS
100.0000 mg | ORAL_TABLET | Freq: Every morning | ORAL | Status: DC
  Administered 2013-07-31 – 2013-08-09 (×10): 100 mg via ORAL
  Filled 2013-07-30 (×10): qty 1

## 2013-07-30 MED ORDER — HEPARIN (PORCINE) IN NACL 100-0.45 UNIT/ML-% IJ SOLN
1000.0000 [IU]/h | INTRAMUSCULAR | Status: DC
  Administered 2013-07-30 – 2013-08-01 (×3): 1000 [IU]/h via INTRAVENOUS
  Filled 2013-07-30 (×4): qty 250

## 2013-07-30 MED ORDER — SODIUM CHLORIDE 0.9 % IJ SOLN: 3.0000 mL | INTRAMUSCULAR | Status: DC | PRN

## 2013-07-30 MED ORDER — AMPICILLIN 250 MG PO CAPS
250.0000 mg | ORAL_CAPSULE | Freq: Four times a day (QID) | ORAL | Status: DC
  Administered 2013-07-30 – 2013-08-09 (×37): 250 mg via ORAL
  Filled 2013-07-30 (×43): qty 1

## 2013-07-30 MED ORDER — FUROSEMIDE 10 MG/ML IJ SOLN
80.0000 mg | Freq: Once | INTRAMUSCULAR | Status: AC
  Administered 2013-07-30: 80 mg via INTRAVENOUS
  Filled 2013-07-30: qty 8

## 2013-07-30 MED ORDER — INSULIN ASPART 100 UNIT/ML ~~LOC~~ SOLN
0.0000 [IU] | Freq: Three times a day (TID) | SUBCUTANEOUS | Status: DC
  Administered 2013-07-31: 1 [IU] via SUBCUTANEOUS
  Administered 2013-08-01: 3 [IU] via SUBCUTANEOUS
  Administered 2013-08-01: 1 [IU] via SUBCUTANEOUS
  Administered 2013-08-01: 2 [IU] via SUBCUTANEOUS
  Administered 2013-08-02: 18:00:00 1 [IU] via SUBCUTANEOUS
  Administered 2013-08-02: 3 [IU] via SUBCUTANEOUS
  Administered 2013-08-03 (×2): 2 [IU] via SUBCUTANEOUS
  Administered 2013-08-03 – 2013-08-04 (×3): 3 [IU] via SUBCUTANEOUS

## 2013-07-30 MED ORDER — HEPARIN BOLUS VIA INFUSION
4000.0000 [IU] | Freq: Once | INTRAVENOUS | Status: AC
  Administered 2013-07-30: 4000 [IU] via INTRAVENOUS
  Filled 2013-07-30: qty 4000

## 2013-07-30 MED ORDER — ADULT MULTIVITAMIN W/MINERALS CH
1.0000 | ORAL_TABLET | Freq: Every day | ORAL | Status: DC
  Administered 2013-07-31 – 2013-08-09 (×10): 1 via ORAL
  Filled 2013-07-30 (×10): qty 1

## 2013-07-30 MED ORDER — AMLODIPINE BESYLATE 5 MG PO TABS
5.0000 mg | ORAL_TABLET | Freq: Every day | ORAL | Status: DC
  Administered 2013-07-30 – 2013-08-07 (×9): 5 mg via ORAL
  Filled 2013-07-30 (×10): qty 1

## 2013-07-30 MED ORDER — HEPARIN (PORCINE) IN NACL 100-0.45 UNIT/ML-% IJ SOLN: 12.0000 [IU]/kg/h | INTRAMUSCULAR | Status: DC

## 2013-07-30 MED ORDER — SODIUM CHLORIDE 0.9 % IJ SOLN
3.0000 mL | Freq: Two times a day (BID) | INTRAMUSCULAR | Status: DC
  Administered 2013-08-01 – 2013-08-09 (×11): 3 mL via INTRAVENOUS

## 2013-07-30 MED ORDER — SODIUM CHLORIDE 0.9 % IV SOLN
250.0000 mL | INTRAVENOUS | Status: DC | PRN
  Administered 2013-07-30: 250 mL via INTRAVENOUS

## 2013-07-30 MED ORDER — ASPIRIN 81 MG PO CHEW
324.0000 mg | CHEWABLE_TABLET | Freq: Once | ORAL | Status: AC
  Administered 2013-07-30: 324 mg via ORAL
  Filled 2013-07-30: qty 4

## 2013-07-30 MED ORDER — ASPIRIN 81 MG PO CHEW
81.0000 mg | CHEWABLE_TABLET | Freq: Every day | ORAL | Status: DC
  Administered 2013-07-30 – 2013-08-08 (×10): 81 mg via ORAL
  Filled 2013-07-30 (×10): qty 1

## 2013-07-30 MED ORDER — METOPROLOL TARTRATE 25 MG PO TABS
25.0000 mg | ORAL_TABLET | Freq: Two times a day (BID) | ORAL | Status: DC
  Administered 2013-07-30 – 2013-08-09 (×20): 25 mg via ORAL
  Filled 2013-07-30 (×21): qty 1

## 2013-07-30 MED ORDER — ACETAMINOPHEN 325 MG PO TABS
650.0000 mg | ORAL_TABLET | ORAL | Status: DC | PRN
  Administered 2013-07-31 – 2013-08-08 (×6): 650 mg via ORAL
  Filled 2013-07-30 (×6): qty 2

## 2013-07-30 MED ORDER — BRIMONIDINE TARTRATE 0.2 % OP SOLN
1.0000 [drp] | Freq: Two times a day (BID) | OPHTHALMIC | Status: DC
  Administered 2013-07-30 – 2013-08-09 (×20): 1 [drp] via OPHTHALMIC
  Filled 2013-07-30: qty 5

## 2013-07-30 MED ORDER — ISOSORBIDE MONONITRATE ER 30 MG PO TB24
30.0000 mg | ORAL_TABLET | Freq: Every day | ORAL | Status: DC
  Administered 2013-07-31 – 2013-08-02 (×3): 30 mg via ORAL
  Filled 2013-07-30 (×4): qty 1

## 2013-07-30 MED ORDER — ONDANSETRON HCL 4 MG/2ML IJ SOLN
4.0000 mg | Freq: Four times a day (QID) | INTRAMUSCULAR | Status: DC | PRN
  Administered 2013-08-02: 18:00:00 4 mg via INTRAVENOUS
  Filled 2013-07-30 (×2): qty 2

## 2013-07-30 MED ORDER — CALCIUM CARBONATE-VITAMIN D 500-200 MG-UNIT PO TABS
1.0000 | ORAL_TABLET | Freq: Every day | ORAL | Status: DC
  Administered 2013-07-31 – 2013-08-09 (×10): 1 via ORAL
  Filled 2013-07-30 (×12): qty 1

## 2013-07-30 MED ORDER — INSULIN GLARGINE 100 UNIT/ML ~~LOC~~ SOLN
54.0000 [IU] | Freq: Every day | SUBCUTANEOUS | Status: DC
  Administered 2013-07-30 – 2013-07-31 (×2): 54 [IU] via SUBCUTANEOUS
  Filled 2013-07-30 (×3): qty 0.54

## 2013-07-30 MED ORDER — VITAMIN D3 25 MCG (1000 UNIT) PO TABS
1000.0000 [IU] | ORAL_TABLET | Freq: Every day | ORAL | Status: DC
  Administered 2013-07-31 – 2013-08-09 (×10): 1000 [IU] via ORAL
  Filled 2013-07-30 (×10): qty 1

## 2013-07-30 NOTE — Consult Note (Signed)
ANTICOAGULATION CONSULT NOTE - Initial Consult  Pharmacy Consult for Heparin Indication: chest pain/ACS  No Known Allergies  Patient Measurements: Height: 5\' 3"  (160 cm) Weight: 180 lb (81.647 kg) IBW/kg (Calculated) : 52.4 Heparin Dosing Weight: 70kg  Vital Signs: Temp: 97.6 F (36.4 C) (10/21 1527) Temp src: Oral (10/21 1527) BP: 130/65 mmHg (10/21 1715) Pulse Rate: 89 (10/21 1715)  Labs:  Recent Labs  07/30/13 1557  HGB 10.8*  HCT 34.6*  PLT 329  CREATININE 1.44*  TROPONINI 0.97*    Estimated Creatinine Clearance: 32.1 ml/min (by C-G formula based on Cr of 1.44).   Medical History: Past Medical History  Diagnosis Date  . Hypertension     moderate left ventricular hypertrophy  . Diabetes mellitus     h/o negative exercise stress test/normal LVF assessed June 2011 by echo EF 60-65%  . Hyperlipidemia   . Carotid artery disease     with less than 60% stenosis on the right in 2006  . History of seizure disorder     on depakote  . History of recurrent UTIs   . Chronic edema   . Depression   . Acid reflux disease   . History of medication noncompliance     this has been ongoing for quite sometime  . Complete heart block     a. previously documented Mobitz I 11/12;  b. 06/2013 Found to have CHB--> MDT Adapta L dc PPM, ser #: [id number].  . Stroke     .  Marland Kitchen Moderate mitral regurgitation     a. 05/2013 Echo: EF 55-65%, no rwma, Mod MR.  . CKD (chronic kidney disease), stage III   . Orthostatic hypotension   . Paroxysmal atrial tachycardia   . Normal Ejection Fraction     a. 05/2013 Echo: EF 55-65%.  Marland Kitchen CAD (coronary artery disease)     a. 06/2013 Cath: LM 25, LAD 68m, Diag 25 ost, LCX 81m, 80d, OM1 small, sev diff prox dzs, LPL mod, nl, RCA 100ost 2/ L->R collats.    Medications:  No anticoagulants pta  Assessment: 81yof who was recently discharged from the hospital 07/27/13 after admission for NSTEMI. Decision was made for medical management. She now  returns to the ED with generalized weakness and hot/cold flashes. First troponin is positive at 0.97. She will begin IV heparin.  During previous admission she was consistently therapeutic on heparin at 1000 and1100 units/hr.   She does have CKD but sCr of 1.44 appears to be around her baseline.  Goal of Therapy:  Heparin level 0.3-0.7 units/ml Monitor platelets by anticoagulation protocol: Yes   Plan:  1) Heparin bolus 4000 units x 1 2) Heparin drip at 1000 units/hr 3) 8 hour heparin level 4) Daily heparin level and CBC  Fredrik Rigger 07/30/2013,5:58 PM

## 2013-07-30 NOTE — ED Notes (Signed)
MD at bedside. 

## 2013-07-30 NOTE — ED Notes (Signed)
Lab called, critical Troponin 0.97, notified MD

## 2013-07-30 NOTE — ED Notes (Signed)
CBG 104 

## 2013-07-30 NOTE — ED Notes (Signed)
Per EMS - pt c/o generalized weakness along with hot/cold flashes. Pt was recently here and dx with UTI, put on antibiotics. Pt also had recent pacemaker implanted, family unsure why it was implanted. Pt has hx of DM. CBG 165. BP 133/85 HR 88 96% on room air, 22 RR. EMS started a 20G in left AC.

## 2013-07-30 NOTE — H&P (Signed)
History and Physical  Patient ID: Becky Collins MRN: [id number], DOB: [birth year] Date of Encounter: 07/30/2013, 6:20 PM Primary Physician: Selinda Flavin, MD Primary Cardiologist: Myrtis Ser  Chief Complaint: CP Reason for Admission: CP  HPI: Ms. Shiffer is a 79 y/o F with history of CAD, HTN, pacemaker 06/2013, moderate MR 05/2013, CKD stage III, orthostatic hypotension, prior medication noncompliance who presented to [facility] for chest pain. She had recent cardiac cath 06/2013 in the setting of CHB requiring pacemaker which showed severe multivessel CAD likely chronic in nature, thus medical therapy was recommended with consideration for outpatient ischemic evaluation. She had recent admission 07/19/13 - 07/27/13 for NSTEMI (troponin 0.39 with f/u troponins negative. She developed RLE weakness and lethargy. CT head was nonacute. Could not have MRI due to pacemaker. Ativan and gabapentin were discontinued. Blood cultures remained negative and urine culture was performed with results actually returning today positive for enterococcus, sensitive to ampicillin/nitrofurantoin/vancomycin but resistant to levofloxacin/tetracycline. She had been treated empirically with Cefuroxime for UTI. B12, vit D, ammonia levels were normal. Her mental status returned to baseline and was ultimately possibly felt due to her UTI with possibly some sundowning +/- sleep deprivation. Cath films from 06/2013 were reviewed  - because the pain was not clearly cardiac (worse with movement) and that PCI would be complex with significant likelihood of restenosis, PCI was deferred. She was discharged 07/27/13 in stable condition.   She returns tonight with complaints of CP. She really has felt poorly since discharge. The patient is a very tangential historian and difficult to follow at times. She becomes tearful. Apparently she has had constant chest pain since discharge that has never gone away. It was originally R sided and now L  sided, not worse with inspiration, movement, palpation or exertion. Nothing makes it better. She went to her husband's grave on Saturday and spent the whole night crying. She has also had worsening SOB that is worse with exertion. She has had orthopnea and difficulty sleeping. She has had nausea and vomiting over the weekend which was resolved. She also complains of abdominal pain that worsens when trying to strain to urinate. She complains of dysuria. Today she apparently went out to "exercise" - her family states she apparently started talking out of her head and ran outside and complained of SOB. They state her mental status has continued to wax and wane at home. Initially during our conversation I thought her husband had died this weekend by the things she was saying but apparently he died two years ago.  In the ER, VSS. Initial troponin 0.97. CXR: Bibasilar opacities, increased on the left. Findings represent atelectasis versus infiltrate. Bilateral pleural effusions, similar on the left and decreased on the right. Afebrile, normal vitals. Labs significant for BUN/Cr 29/1.44 (cr last adm 1.21-1.44), pBNP 2555, troponin 0.97. Hgb 10.8 (stable), Lactic acid WNL. UA with trace leukos, few sq, hyaline casts. Afebrile, normal WBC.  Past Medical History  Diagnosis Date  . Hypertension     moderate left ventricular hypertrophy  . Diabetes mellitus     h/o negative exercise stress test/normal LVF assessed June 2011 by echo EF 60-65%  . Hyperlipidemia   . Carotid artery disease     with less than 60% stenosis on the right in 2006  . History of seizure disorder     on depakote  . History of recurrent UTIs   . Chronic edema   . Depression   . Acid reflux disease   .  History of medication noncompliance     this has been ongoing for quite sometime  . Complete heart block     a. previously documented Mobitz I 11/12;  b. 06/2013 Found to have CHB--> MDT Adapta L dc PPM, ser #: [id number].  . Stroke      .  Marland Kitchen Moderate mitral regurgitation     a. 05/2013 Echo: EF 55-65%, no rwma, Mod MR.  . CKD (chronic kidney disease), stage III   . Orthostatic hypotension   . Paroxysmal atrial tachycardia   . Normal Ejection Fraction     a. 05/2013 Echo: EF 55-65%.  Marland Kitchen CAD (coronary artery disease)     a. 06/2013 Cath: LM 25, LAD 64m, Diag 25 ost, LCX 69m, 80d, OM1 small, sev diff prox dzs, LPL mod, nl, RCA 100ost 2/ L->R collats.     Most Recent Cardiac Studies: 06/24/13 Cardiac Cath Procedure: Right Heart Cath, Left Heart Cath, Selective Coronary Angiography, LV angiography  Indication: Fatigue dyspnea, chest pain, CHB  Procedural Details: The right groin was prepped, draped, and anesthetized with 1% lidocaine. Using the modified Seldinger technique a 5 French sheath was placed in the right femoral artery and a 7 French sheath was placed in the right femoral vein. A Swan-Ganz catheter was used for the right heart catheterization. Standard protocol was followed for recording of right heart pressures and sampling of oxygen saturations. Fick cardiac output was calculated. Standard Judkins catheters were used for selective coronary angiography and left ventriculography. There were no immediate procedural complications. The patient was transferred to the post catheterization recovery area for further monitoring.  Procedural Findings:  Hemodynamics:  RA 9  RV 41/6  PA 37/12 Mean 20  PCWP 11  LV NA  AO 140/49  Oxygen saturations:  PA 51  AO 100  Cardiac Output (Fick) 3.62 Cardiac Index (Fick) 1.85  Coronary angiography:  Coronary dominance: Right  Left mainstem: Distal 25%.  Left anterior descending (LAD): Heavily calcified. Mid LAD 70% stenosis after mid diagonal. Mid diagonal large ostial 25%.  Left circumflex (LCx): AV groove 95% after OM1, OM1 small with high grade diffuse proximal stenosis. Distal 80% stenosis before later PL. PL is moderate sized and normal.  Right coronary artery (RCA): 100% ostial  stenosis. Left to right collateral flow predominantly from the LAD.  Left ventriculography: LV not injected.  Final Conclusions: Native 3 vessel CAD. This appears to be chronic. Right heart pressures are not elevated.  Recommendations: Discussed with Dr. Myrtis Ser and Dr. Ladona Ridgel. She is found to have complete heart block and she will need a pacemaker. She will have medical management for her CAD. However, she will need be further evaluated for possible revascularization if symptoms continue.   2D Echo 07/22/13 - Left ventricle: The cavity size was normal. Wall thickness was increased in a pattern of moderate LVH. Systolic function was normal. The estimated ejection fraction was in the range of 50% to 55%. Moderate hypokinesis of the basalinferior myocardium. - Mitral valve: Moderate regurgitation. - Left atrium: The atrium was mildly dilated.   Surgical History:  Past Surgical History  Procedure Laterality Date  . Cesarean section  1973  . Pacemaker insertion       Home Meds: Prior to Admission medications   Medication Sig Start Date End Date Taking? Authorizing Provider  amLODipine (NORVASC) 5 MG tablet Take 1 tablet (5 mg total) by mouth daily. 06/27/13  Yes Rollene Rotunda, MD  aspirin 81 MG tablet Take 81 mg by mouth at  bedtime.    Yes Historical Provider, MD  brimonidine (ALPHAGAN) 0.2 % ophthalmic solution Place 1 drop into both eyes 2 (two) times daily.   Yes Historical Provider, MD  calcium-vitamin D (OSCAL WITH D) 500-200 MG-UNIT per tablet Take 1 tablet by mouth daily with breakfast.    Yes Historical Provider, MD  cefUROXime (CEFTIN) 500 MG tablet Take 1 tablet (500 mg total) by mouth 2 (two) times daily with a meal. 07/27/13  Yes Rhonda G Barrett, PA-C  Cholecalciferol (VITAMIN D-3) 1000 UNITS CAPS Take 1,000 Units by mouth daily.    Yes Historical Provider, MD  insulin aspart (NOVOLOG) 100 UNIT/ML injection Inject 6 Units into the skin 3 (three) times daily with meals. 07/27/13   Yes Rhonda G Barrett, PA-C  insulin glargine (LANTUS) 100 UNIT/ML injection Inject 0.54 mLs (54 Units total) into the skin at bedtime. 07/27/13  Yes Rhonda G Barrett, PA-C  isosorbide mononitrate (IMDUR) 30 MG 24 hr tablet Take 1 tablet (30 mg total) by mouth daily. 07/27/13  Yes Rhonda G Barrett, PA-C  LORazepam (ATIVAN) 0.5 MG tablet Take 1 tablet (0.5 mg total) by mouth daily as needed. For anxiety 05/27/13  Yes Shanker Levora Dredge, MD  metoprolol tartrate (LOPRESSOR) 25 MG tablet Take 1 tablet (25 mg total) by mouth 2 (two) times daily. 07/05/13  Yes Brooke O Edmisten, PA-C  MULTIPLE VITAMIN PO Take 1 tablet by mouth daily.    Yes Historical Provider, MD  omeprazole (PRILOSEC) 20 MG capsule Take 20 mg by mouth every morning.    Yes Historical Provider, MD  sertraline (ZOLOFT) 100 MG tablet Take 100 mg by mouth every morning.   Yes Historical Provider, MD  simvastatin (ZOCOR) 40 MG tablet Take 1 tablet (40 mg total) by mouth at bedtime. 07/27/13  Yes Rhonda G Barrett, PA-C    Allergies: No Known Allergies  History   Social History  . Marital Status: Widowed    Spouse Name: N/A    Number of Children: N/A  . Years of Education: N/A   Occupational History  . Not on file.   Social History Main Topics  . Smoking status: Never Smoker   . Smokeless tobacco: Never Used  . Alcohol Use: No  . Drug Use: No  . Sexual Activity: Not on file   Other Topics Concern  . Not on file   Social History Narrative   Husband died mid 2011/08/16.     Family History  Problem Relation Age of Onset  . Heart attack Father   . Heart attack Mother   . Diabetes Sister   . Hypertension Sister   . Peripheral vascular disease Sister   . Diabetes Brother   . Heart disease Brother   . Hypertension Brother     Review of Systems: General: negative for chills, fever Cardiovascular: trace LEE Dermatological: negative for rash Respiratory: negative for cough or wheezing Urologic: negative for  hematuria Abdominal: negative for diarrhea, bright red blood per rectum, melena, or hematemesis Neurologic: negative for visual changes, syncope, or dizziness All other systems reviewed and are otherwise negative except as noted above.  Labs:   Lab Results  Component Value Date   WBC 9.9 07/30/2013   HGB 10.8* 07/30/2013   HCT 34.6* 07/30/2013   MCV 79.7 07/30/2013   PLT 329 07/30/2013    Recent Labs Lab 07/30/13 1557  NA 137  K 4.4  CL 103  CO2 21  BUN 29*  CREATININE 1.44*  CALCIUM 10.3  PROT  7.3  BILITOT 0.4  ALKPHOS 59  ALT 21  AST 32  GLUCOSE 119*    Recent Labs  07/30/13 1557  TROPONINI 0.97*   Radiology/Studies:  Dg Chest 2 View  07/30/2013   CLINICAL DATA:  Shortness of breath and chest pain.  EXAM: CHEST  2 VIEW  COMPARISON:  Multiple priors  FINDINGS: Cardiac pacing device is unchanged. The cardiomediastinal silhouette is unchanged. No frank edema. Bibasilar opacities are identified, increased on the left. Similar left pleural effusion. Decreased right pleural effusion No pleural effusion or pneumothorax. No acute osseous abnormality.  IMPRESSION: 1. Bibasilar opacities, increased on the left. Findings represent atelectasis versus infiltrate.  2. Bilateral pleural effusions, similar on the left and decreased on the right.   Electronically Signed   By: Jerene Dilling M.D.   On: 07/30/2013 16:32   Dg Chest 2 View  07/26/2013   CLINICAL DATA:  Decreased breath sounds at the right base.  EXAM: CHEST  2 VIEW  COMPARISON:  Multiple priors  FINDINGS: Cardiac pacing device is unchanged. The cardiomediastinal silhouette is unchanged. No frank edema. Bibasilar opacities. Low lung volumes. No pneumothorax. Bilateral small pleural effusions.  IMPRESSION: Bibasilar opacities, likely representing atelectasis. Developing infection is not excluded. Small bilateral pleural effusions.   Electronically Signed   By: Jerene Dilling M.D.   On: 07/26/2013 09:47   Ct Head  Wo Contrast  07/22/2013   CLINICAL DATA:  History of hypertension, diabetes, and heart disease. Confusion.  EXAM: CT HEAD WITHOUT CONTRAST  TECHNIQUE: Contiguous axial images were obtained from the base of the skull through the vertex without contrast.  COMPARISON:  07/20/2013.  FINDINGS: Stable atrophy and chronic microvascular ischemic change. No evidence for acute infarction, hemorrhage, mass lesion, hydrocephalus, or extra-axial fluid. Calvarium intact. Advanced vascular calcification. Minimal sinus fluid is stable. No mastoid inflammatory process.  IMPRESSION: Stable atrophy and chronic microvascular ischemic change. No acute intracranial findings.   Electronically Signed   By: Davonna Belling M.D.   On: 07/22/2013 18:27   Ct Head Wo Contrast  07/20/2013   *RADIOLOGY REPORT*  Clinical Data: Confusion  CT HEAD WITHOUT CONTRAST  Technique:  Contiguous axial images were obtained from the base of the skull through the vertex without contrast.  Comparison: Prior MRI from 08/16/2011  Findings: Mild prominence of the CSF containing spaces was compatible with generalized atrophy. Scattered and confluent hypodensities within the periventricular deep white matter most compatible with chronic microvascular ischemic disease.  Remote right parietal infarct is noted, unchanged as compared to prior MRI.  No acute intracranial hemorrhage is identified.  There is no large vessel territory infarct.  No extra-axial fluid collection. No midline shift or mass lesion.  Calvarium is intact.  Orbital soft tissues are within normal limits.  Paranasal sinuses and mastoid air cells are clear.  IMPRESSION: 1. No acute intracranial process.  2. Unchanged remote right parietal infarct.  3. Mild age related atrophy and chronic microvascular ischemic disease, similar to prior.   Original Report Authenticated By: Rise Mu, M.D.    EKG: atrial sensed v paced 85bpm TWI I avL V5-V6   Physical Exam: Blood pressure 130/65, pulse  89, temperature 97.6 F (36.4 C), temperature source Oral, resp. rate 21, height 5\' 3"  (1.6 m), weight 180 lb (81.647 kg), SpO2 100.00%. General: Well developed tearful AAF in no acute distress. Head: Normocephalic, atraumatic, sclera non-icteric, no xanthomas, nares are without discharge.  Neck: JVD mildly elevated. Lungs: Diminished BS at bases, crackles 1/2 way up.  Breathing is unlabored. Heart: RRR with S1 S2. No murmurs, rubs, or gallops appreciated. Abdomen: Soft, non-tender, non-distended with normoactive bowel sounds. No hepatomegaly. No rebound/guarding. No obvious abdominal masses. Msk:  Strength and tone appear normal for age. Extremities: No clubbing or cyanosis. Trace-1+ edema.  Distal pedal pulses are 2+ and equal bilaterally. Neuro: Alert and oriented X 3 at present but occasionally says things that don't make sense. No focal deficit. No facial asymmetry. Moves all extremities spontaneously. Psych:  Responds to questions appropriately with a tearful affect.    ASSESSMENT AND PLAN: 1. Chest pain/NSTEMI with known CAD 2. Acute on chronic diastolic CHF  3. CKD stage III 4. Abdominal pain/dysuria with recent UTI 5. HTN 6. Diabetes mellitus 7. CHB s/p pacemaker 8. Anemia, appearing chronic   Will admit, cycle enzymes, continue IV heparin for now. Continue ASA, BB, Imdur, statin, amlodipine. Check lipase. Dr. Myrtis Ser does not anticipate proceeding with PCI, see below for thoughts. He would like to continue current med regimen. If PCI is not pursued this adm, could consider adding Plavix to medical regimen. Will give 80mg  IV Lasix to diurese and reassess clinical status and Cr in AM. Will need to place foley to accurately measure I/O. Will change antibiotic to ampicillin - perhaps it was not sensitive to cephalosporins? This sensitivity was not reported in the prior culture. Avoid narcotics and sleep aids given h/o worsening mental status related to this. Would consider neurology  evaluation this admission given fluctuating dementia.   Signed, Ronie Spies PA-C 07/30/2013, 6:20 PM Patient seen and examined. I agree with the assessment and plan as detailed above. See also my additional thoughts below.   I spoke with two family members in room. Patient's mental status remains abnormal. She is SOB. We will try again to get her better.  Willa Rough, MD, [facility] 07/30/2013 7:43 PM

## 2013-07-30 NOTE — ED Provider Notes (Signed)
CSN: [id number]     Arrival date & time 07/30/13  1517 History   First MD Initiated Contact with Patient 07/30/13 1538     Chief Complaint  Patient presents with  . Urinary Tract Infection  . Weakness   (Consider location/radiation/quality/duration/timing/severity/associated sxs/prior Treatment) The history is provided by the patient and medical records. No language interpreter was used.    Becky Collins is a 79 year old female who presents the emergency department with chief complaint of pain and shortness of breath.  Patient was discharged on 07/27/2013 after admission for acute encephalopathy, acute myocardial infarction and urinary tract infection.  Patient states that she has been having worsening shortness of breath and fatigue since her discharge.  She complains of severe pain in the suprapubic region which is intermittent and worse with urination.  She also complains of bilateral flank pain.  Patient is audibly short of breath speaking in 3-4 worsened sentence is.  She states that she has a 3 pillow orthopnea.  She denies any chest pain however her shortness of breath is exertional and relieved with rest.  She states that she is unable to bathe for long periods of time or put on her clothing without having to stop and rest.  The patient states that this isn't the worse than it has ever been and that she could do more before her admission.  Patient had an echocardiogram during her visit here that showed ejection fraction of 50-55%.  She personally have been discharged on Ceftin for her urinary tract infection.  Past Medical History  Diagnosis Date  . Hypertension     moderate left ventricular hypertrophy  . Diabetes mellitus     h/o negative exercise stress test/normal LVF assessed June 2011 by echo EF 60-65%  . Hyperlipidemia   . Carotid artery disease     with less than 60% stenosis on the right in 2006  . History of seizure disorder     on depakote  . History of recurrent UTIs    . Chronic edema   . Depression   . Acid reflux disease   . History of medication noncompliance     this has been ongoing for quite sometime  . Complete heart block     a. previously documented Mobitz I 11/12;  b. 06/2013 Found to have CHB--> MDT Adapta L dc PPM, ser #: [id number].  . Stroke     .  Marland Kitchen Moderate mitral regurgitation     a. 05/2013 Echo: EF 55-65%, no rwma, Mod MR.  . CKD (chronic kidney disease), stage III   . Orthostatic hypotension   . Paroxysmal atrial tachycardia   . Normal Ejection Fraction     a. 05/2013 Echo: EF 55-65%.  Marland Kitchen CAD (coronary artery disease)     a. 06/2013 Cath: LM 25, LAD 83m, Diag 25 ost, LCX 7m, 80d, OM1 small, sev diff prox dzs, LPL mod, nl, RCA 100ost 2/ L->R collats.   Past Surgical History  Procedure Laterality Date  . Cesarean section  1973  . Pacemaker insertion     Family History  Problem Relation Age of Onset  . Heart attack Father   . Heart attack Mother   . Diabetes Sister   . Hypertension Sister   . Peripheral vascular disease Sister   . Diabetes Brother   . Heart disease Brother   . Hypertension Brother    History  Substance Use Topics  . Smoking status: Never Smoker   . Smokeless tobacco: Never Used  .  Alcohol Use: No   OB History   Grav Para Term Preterm Abortions TAB SAB Ect Mult Living                 Review of Systems  Constitutional: Positive for chills, activity change, appetite change and fatigue.  HENT: Negative for congestion.   Eyes: Negative for visual disturbance.  Respiratory: Positive for shortness of breath. Negative for cough, choking, chest tightness and wheezing.   Cardiovascular: Negative for chest pain and leg swelling.  Gastrointestinal: Positive for abdominal pain.  Endocrine: Negative for polyuria.  Genitourinary: Positive for dysuria, flank pain and pelvic pain.  Musculoskeletal: Negative for joint swelling and myalgias.  Skin: Negative for rash.  Neurological: Positive for weakness.   Psychiatric/Behavioral: Negative for confusion.  All other systems reviewed and are negative.    Allergies  Review of patient's allergies indicates no known allergies.  Home Medications   Current Outpatient Rx  Name  Route  Sig  Dispense  Refill  . amLODipine (NORVASC) 5 MG tablet   Oral   Take 1 tablet (5 mg total) by mouth daily.   30 tablet   6   . aspirin 81 MG tablet   Oral   Take 81 mg by mouth at bedtime.          . brimonidine (ALPHAGAN) 0.2 % ophthalmic solution   Both Eyes   Place 1 drop into both eyes 2 (two) times daily.         . calcium-vitamin D (OSCAL WITH D) 500-200 MG-UNIT per tablet   Oral   Take 1 tablet by mouth daily with breakfast.          . cefUROXime (CEFTIN) 500 MG tablet   Oral   Take 1 tablet (500 mg total) by mouth 2 (two) times daily with a meal.   12 tablet   0   . Cholecalciferol (VITAMIN D-3) 1000 UNITS CAPS   Oral   Take 1,000 Units by mouth daily.          . insulin aspart (NOVOLOG) 100 UNIT/ML injection   Subcutaneous   Inject 6 Units into the skin 3 (three) times daily with meals.   1 vial   1   . insulin glargine (LANTUS) 100 UNIT/ML injection   Subcutaneous   Inject 0.54 mLs (54 Units total) into the skin at bedtime.   10 mL   12   . isosorbide mononitrate (IMDUR) 30 MG 24 hr tablet   Oral   Take 1 tablet (30 mg total) by mouth daily.   30 tablet   11   . LORazepam (ATIVAN) 0.5 MG tablet   Oral   Take 1 tablet (0.5 mg total) by mouth daily as needed. For anxiety   15 tablet   0   . metoprolol tartrate (LOPRESSOR) 25 MG tablet   Oral   Take 1 tablet (25 mg total) by mouth 2 (two) times daily.   60 tablet   3   . MULTIPLE VITAMIN PO   Oral   Take 1 tablet by mouth daily.          Marland Kitchen omeprazole (PRILOSEC) 20 MG capsule   Oral   Take 20 mg by mouth every morning.          . sertraline (ZOLOFT) 100 MG tablet   Oral   Take 100 mg by mouth every morning.         . simvastatin (ZOCOR) 40  MG tablet  Oral   Take 1 tablet (40 mg total) by mouth at bedtime.   30 tablet   11    BP 135/55  Temp(Src) 97.6 F (36.4 C) (Oral)  Resp 26  Ht 5\' 3"  (1.6 m)  Wt 180 lb (81.647 kg)  BMI 31.89 kg/m2  SpO2 97% Physical Exam  Constitutional: She is oriented to person, place, and time. No distress.  HENT:  Head: Normocephalic and atraumatic.  Eyes: Conjunctivae are normal. No scleral icterus.  Neck: Normal range of motion.  Cardiovascular: Normal rate, regular rhythm and normal heart sounds.  Exam reveals no gallop and no friction rub.   No murmur heard. Pulmonary/Chest: Breath sounds normal. No respiratory distress.  Is speaking in 3-4 worsened symptoms were worsened symptoms.  Unable to take deep breaths.  No abnormal breath sounds no wheezes no Rales.  Abdominal: Soft. Bowel sounds are normal. She exhibits no distension (suprapubic tenderness) and no mass. There is tenderness. There is no guarding.  Positive CVA tenderness  Musculoskeletal: She exhibits no edema.  Neurological: She is alert and oriented to person, place, and time.  Skin: Skin is warm and dry. She is not diaphoretic.  Psychiatric: Her behavior is normal. Judgment normal.    ED Course  Procedures (including critical care time) Labs Review Labs Reviewed  CBC WITH DIFFERENTIAL  URINALYSIS, ROUTINE W REFLEX MICROSCOPIC  TROPONIN I  PRO B NATRIURETIC PEPTIDE  COMPREHENSIVE METABOLIC PANEL   Imaging Review No results found.  EKG Interpretation   None       Date: 07/30/2013  Rate: 85  Rhythm: ventricular paced rhythm  QRS Axis: normal  Intervals: normal  ST/T Wave abnormalities: normal  Conduction Disutrbances:none  Narrative Interpretation:   Old EKG Reviewed: unchanged   MDM  No diagnosis found. The patient returned to the emergency department 4 days after discharge for acute coronary syndrome, urinary tract infection and encephalopathy.  She appears very short of breath.  Do not suspect she  is having COPD issues she has a previous diagnosis is no wheezing on exam.  Concern for another acute myocardial infarction versus possible pulmonary embolism.  Her bowels or is moderate risk. Discussed the case with Dr. Rhunette Croft he will see the patient. We'll obtain labs, chest x-ray, EKG.  We may progress ECT angiolytic chest if other causes are ruled out.  4:58 PM Patient with BL pleural effusions. Opacities on the left likely represent atelectasis considering the clinical presentation as the patient is without fever or leukocytosis.    Filed Vitals:   07/30/13 1527 07/30/13 1645 07/30/13 1715  BP: 135/55 127/81 130/65  Pulse:  81 89  Temp: 97.6 F (36.4 C)    TempSrc: Oral    Resp: 26 15 21   Height: 5\' 3"  (1.6 m)    Weight: 180 lb (81.647 kg)    SpO2: 97% 98% 100%   5:56 PM Patient with elevated troponin. EKG unchanged. Patient has an elevation in her BNP. Elevated troponin may be secondary to chf, HCAP as well althought the patient has no wlwvation in the lactate. I have not yet ordered abx or CTangio. I have placed consult to cardiology to discuss the patient case further..   6:10 PM I spoke with Dr. Myrtis Ser who will admit the patient for NSTEMI. I discussed differential and further work up and Dr. Myrtis Ser who agrees that no further testing is necessary. He also states that her dyspnea is her baseline breathing. Patient appears stable for admission.   Arthor Captain, PA-C  08/02/13 1055 

## 2013-07-30 NOTE — ED Notes (Signed)
Pt reports she was supposed to be admitted on Saturday but didn't want to stay because she had too many other things going on. Pt reports she is still having a burning sensation when she urinates. sts she is still taking her antibiotics, not finished them yet. Denies CP/sob. Only pain located in lower abd, sts she had this pain on Saturday.

## 2013-07-30 NOTE — Progress Notes (Signed)
14 Fr Foley cath placed for aggressive diuresis.  Pt currently being treated with oral antibiotics for UTI prior to admission. Pericare provided prior to foley insertion. Sterile technique used. Di Kindle RN @ bedside. Pt and daughter Boyd Kerbs educated on need for foley and care of foley. Both voiced understanding. Pt tolerated well. Stat lock used to secure. Clear yellow urine noted in tubing. No complications observed.

## 2013-07-31 ENCOUNTER — Encounter: Payer: Self-pay | Admitting: Internal Medicine

## 2013-07-31 ENCOUNTER — Inpatient Hospital Stay (HOSPITAL_COMMUNITY): Payer: Medicare Other

## 2013-07-31 DIAGNOSIS — G9340 Encephalopathy, unspecified: Secondary | ICD-10-CM

## 2013-07-31 LAB — URINE CULTURE: Culture: NO GROWTH

## 2013-07-31 LAB — CBC
HCT: 34.5 % — ABNORMAL LOW (ref 36.0–46.0)
Hemoglobin: 11 g/dL — ABNORMAL LOW (ref 12.0–15.0)
MCH: 25.3 pg — ABNORMAL LOW (ref 26.0–34.0)
MCHC: 31.9 g/dL (ref 30.0–36.0)
RBC: 4.34 MIL/uL (ref 3.87–5.11)

## 2013-07-31 LAB — BASIC METABOLIC PANEL
BUN: 29 mg/dL — ABNORMAL HIGH (ref 6–23)
Calcium: 10.1 mg/dL (ref 8.4–10.5)
GFR calc non Af Amer: 30 mL/min — ABNORMAL LOW (ref >90)
Glucose, Bld: 141 mg/dL — ABNORMAL HIGH (ref 70–99)
Sodium: 140 mEq/L (ref 135–145)

## 2013-07-31 LAB — LIPASE, BLOOD: Lipase: 19 U/L (ref 11–59)

## 2013-07-31 LAB — GLUCOSE, CAPILLARY
Glucose-Capillary: 104 mg/dL — ABNORMAL HIGH (ref 70–99)
Glucose-Capillary: 107 mg/dL — ABNORMAL HIGH (ref 70–99)
Glucose-Capillary: 150 mg/dL — ABNORMAL HIGH (ref 70–99)
Glucose-Capillary: 190 mg/dL — ABNORMAL HIGH (ref 70–99)

## 2013-07-31 MED ORDER — MORPHINE SULFATE 2 MG/ML IJ SOLN
INTRAMUSCULAR | Status: AC
  Filled 2013-07-31: qty 1

## 2013-07-31 MED ORDER — FUROSEMIDE 10 MG/ML IJ SOLN
80.0000 mg | Freq: Every day | INTRAMUSCULAR | Status: DC
  Administered 2013-07-31 – 2013-08-01 (×2): 80 mg via INTRAVENOUS
  Filled 2013-07-31 (×2): qty 8

## 2013-07-31 MED ORDER — MORPHINE SULFATE 2 MG/ML IJ SOLN
2.0000 mg | Freq: Four times a day (QID) | INTRAMUSCULAR | Status: DC | PRN
  Administered 2013-07-31: 1 mg via INTRAVENOUS
  Administered 2013-08-03 (×2): 2 mg via INTRAVENOUS
  Filled 2013-07-31 (×2): qty 1

## 2013-07-31 NOTE — Consult Note (Signed)
NEURO HOSPITALIST CONSULT NOTE    Reason for Consult: AMS  HPI:                                                                                                                                          Becky Collins is an 79 y.o. female with history of stroke, PAF, S/P pacemaker, CKD. PAteitn was recently in the hospital and D/C on 07/27/2013.  At that time she had diagnosis of encephalopathy (b12, ammonia and TSH were normal), MI and UTI. CT at that time showed no acute CVA.  Per note on that admission, Patient's daughter also reported that she had been on Depakote for 1 (reported as generalized epileptic) seizure episode several years ago and Depakote was stopped in August this year. Patient's daughter reported that she has been fine and had no repeat seizures since then. Patient was started on low dose Seroquel at night but not discharged on this medication.  At time of discharge on 07/27/13 she was reported to be back to baseline.   She was brought back to the hospital 07-30-13 for SOB, bilateral flank pain. Cardiology has seen patient and is considering proceeding with PCI to the circumflex however optimally they would prefer CABG but with AMS they are hesitant to proceed with CABG. Neurology was consulted to further evaluate her confusion and mental decline. In consultation with the patient some information was obtained but grandaughter at bedside is not very informative and patient only gives limited information.  She lives at home with her daughter and family, she does not drive and has not driven for 5 years (reaon not clearly known), she can bath and dress herself and at times cook her own meal (most meals are cooked for her).  She has not done the billing for some time.  Per granddaughter she has been in a lot of pain over last few weeks and when she was brought home from the hospital she remained in pain.  She has not been sleeping well either. The night she was brought home  from the hospital she was talking about her dead husband a lot and was bringing pillows into his old room (her bedroom now).  Patient recalls the event and states she missed him a lot.    As far as her old seizures, she states she had bilateral arm and leg shaking but never lost consciousness. She was on "250 mg Depakote" BID which kept her from having seizures.  Currently she is awake and alert, able to tell me she is at [facility] but believes it is 9134 and August.  She follows all commands but when asked to count or recall objects she quickly states "I do not know" and gives little effort to give correct answer.  She also states she has been depressed the last few weeks and "has a lot on her mind".   Labs on admission shows a UA with no leukocytes or nitrites (culture pending), BUN 29 and Cr 1.44, normal liver function. Last Ct head 07/22/13 showed Stable atrophy and chronic microvascular ischemic change.   Past Medical History  Diagnosis Date  . Hypertension     moderate left ventricular hypertrophy  . Diabetes mellitus     h/o negative exercise stress test/normal LVF assessed June 2011 by echo EF 60-65%  . Hyperlipidemia   . Carotid artery disease     with less than 60% stenosis on the right in 2006  . History of seizure disorder     on depakote  . History of recurrent UTIs   . Chronic edema   . Depression   . Acid reflux disease   . History of medication noncompliance     this has been ongoing for quite sometime  . Complete heart block     a. previously documented Mobitz I 11/12;  b. 06/2013 Found to have CHB--> MDT Adapta L dc PPM, ser #: [id number].  . Stroke     .  Marland Kitchen Moderate mitral regurgitation     a. 05/2013 Echo: EF 55-65%, no rwma, Mod MR.  . CKD (chronic kidney disease), stage III   . Orthostatic hypotension   . Paroxysmal atrial tachycardia   . Normal Ejection Fraction     a. 05/2013 Echo: EF 55-65%.  Marland Kitchen CAD (coronary artery disease)     a. 06/2013 Cath: LM 25,  LAD 7m, Diag 25 ost, LCX 96m, 80d, OM1 small, sev diff prox dzs, LPL mod, nl, RCA 100ost 2/ L->R collats.    Past Surgical History  Procedure Laterality Date  . Cesarean section  1973  . Pacemaker insertion      Family History  Problem Relation Age of Onset  . Heart attack Father   . Heart attack Mother   . Diabetes Sister   . Hypertension Sister   . Peripheral vascular disease Sister   . Diabetes Brother   . Heart disease Brother   . Hypertension Brother     Social History:  reports that she has never smoked. She has never used smokeless tobacco. She reports that she does not drink alcohol or use illicit drugs.  No Known Allergies  MEDICATIONS:                                                                                                                     Prior to Admission:  Prescriptions prior to admission  Medication Sig Dispense Refill  . amLODipine (NORVASC) 5 MG tablet Take 1 tablet (5 mg total) by mouth daily.  30 tablet  6  . aspirin 81 MG tablet Take 81 mg by mouth at bedtime.       . brimonidine (ALPHAGAN) 0.2 % ophthalmic solution Place 1 drop into both eyes 2 (two) times daily.      Marland Kitchen  calcium-vitamin D (OSCAL WITH D) 500-200 MG-UNIT per tablet Take 1 tablet by mouth daily with breakfast.       . cefUROXime (CEFTIN) 500 MG tablet Take 1 tablet (500 mg total) by mouth 2 (two) times daily with a meal.  12 tablet  0  . Cholecalciferol (VITAMIN D-3) 1000 UNITS CAPS Take 1,000 Units by mouth daily.       . insulin aspart (NOVOLOG) 100 UNIT/ML injection Inject 6 Units into the skin 3 (three) times daily with meals.  1 vial  1  . insulin glargine (LANTUS) 100 UNIT/ML injection Inject 0.54 mLs (54 Units total) into the skin at bedtime.  10 mL  12  . isosorbide mononitrate (IMDUR) 30 MG 24 hr tablet Take 1 tablet (30 mg total) by mouth daily.  30 tablet  11  . LORazepam (ATIVAN) 0.5 MG tablet Take 1 tablet (0.5 mg total) by mouth daily as needed. For anxiety  15 tablet  0   . metoprolol tartrate (LOPRESSOR) 25 MG tablet Take 1 tablet (25 mg total) by mouth 2 (two) times daily.  60 tablet  3  . MULTIPLE VITAMIN PO Take 1 tablet by mouth daily.       Marland Kitchen omeprazole (PRILOSEC) 20 MG capsule Take 20 mg by mouth every morning.       . sertraline (ZOLOFT) 100 MG tablet Take 100 mg by mouth every morning.      . simvastatin (ZOCOR) 40 MG tablet Take 1 tablet (40 mg total) by mouth at bedtime.  30 tablet  11   Scheduled: . amLODipine  5 mg Oral Daily  . ampicillin  250 mg Oral Q6H  . aspirin  81 mg Oral QHS  . brimonidine  1 drop Both Eyes BID  . calcium-vitamin D  1 tablet Oral Q breakfast  . cholecalciferol  1,000 Units Oral Daily  . furosemide  80 mg Intravenous Daily  . insulin aspart  0-9 Units Subcutaneous TID WC  . insulin glargine  54 Units Subcutaneous QHS  . isosorbide mononitrate  30 mg Oral Daily  . metoprolol tartrate  25 mg Oral BID  . multivitamin with minerals  1 tablet Oral Daily  . pantoprazole  40 mg Oral Daily  . sertraline  100 mg Oral q morning - 10a  . sodium chloride  3 mL Intravenous Q12H     ROS:                                                                                                                                       History obtained from the patient and grandaughter  General ROS: negative for - chills, fatigue, fever, night sweats, weight gain or weight loss Psychological ROS: negative for - behavioral disorder, hallucinations, memory difficulties, mood swings or suicidal ideation Ophthalmic ROS: negative for - blurry vision, double vision, eye pain or loss of vision ENT ROS: negative for -  epistaxis, nasal discharge, oral lesions, sore throat, tinnitus or vertigo Allergy and Immunology ROS: negative for - hives or itchy/watery eyes Hematological and Lymphatic ROS: negative for - bleeding problems, bruising or swollen lymph nodes Endocrine ROS: negative for - galactorrhea, hair pattern changes, polydipsia/polyuria or  temperature intolerance Respiratory ROS: negative for - cough, hemoptysis, shortness of breath or wheezing Cardiovascular ROS: negative for - chest pain, dyspnea on exertion, edema or irregular heartbeat Gastrointestinal ROS: negative for - abdominal pain, diarrhea, hematemesis, nausea/vomiting or stool incontinence Genito-Urinary ROS: negative for - dysuria, hematuria, incontinence or urinary frequency/urgency Musculoskeletal ROS: negative for - joint swelling or muscular weakness Neurological ROS: as noted in HPI Dermatological ROS: negative for rash and skin lesion changes   Blood pressure 159/49, pulse 72, temperature 97.8 F (36.6 C), temperature source Oral, resp. rate 25, height 5\' 6"  (1.676 m), weight 86.7 kg (191 lb 2.2 oz), SpO2 97.00%.   Neurologic Examination:                                                                                                      Mental Status: Alert,  alert, able to tell me she is at [facility] but believes it is 9134 and August.  She follows all commands but when asked to count or recall objects she quickly states "I do not know" and gives little effort to give correct answer.  She also states she has been depressed the last few weeks and "has a lot on her mind".  Speech fluent without evidence of aphasia.  Able to follow 3 step commands without difficulty. Cranial Nerves: II: Discs flat bilaterally; Visual fields grossly normal, pupils equal, round, reactive to light and accommodation III,IV, VI: ptosis not present, extra-ocular motions intact bilaterally V,VII: smile slightly asymmetric on the left, facial light touch sensation normal bilaterally VIII: hearing normal bilaterally IX,X: gag reflex present XI: bilateral shoulder shrug XII: midline tongue extension without atrophy or fasciculations  Motor: Right : Upper extremity   5/5    Left:     Upper extremity   5/5  Lower extremity   5/5     Lower extremity   5/5 Tone and  bulk:normal tone throughout; no atrophy noted Sensory: Pinprick and light touch intact throughout, bilaterally Deep Tendon Reflexes:  Right: Upper Extremity   Left: Upper extremity   biceps (C-5 to C-6) 2/4   biceps (C-5 to C-6) 2/4 tricep (C7) 2/4    triceps (C7) 2/4 Brachioradialis (C6) 2/4  Brachioradialis (C6) 2/4  Lower Extremity Lower Extremity  quadriceps (L-2 to L-4) 0/4   quadriceps (L-2 to L-4) 0/4 Achilles (S1) 0/4   Achilles (S1) 0/4  Plantars: Right: downgoing   Left: downgoing Cerebellar: normal finger-to-nose,  normal heel-to-shin test Gait: not tested CV: pulses palpable throughout    No results found for this basename: cbc, bmp, coags, chol, tri, ldl, hga1c    Results for orders placed during the hospital encounter of 07/30/13 (from the past 48 hour(s))  CBC WITH DIFFERENTIAL     Status: Abnormal   Collection Time  07/30/13  3:57 PM      Result Value Range   WBC 9.9  4.0 - 10.5 K/uL   RBC 4.34  3.87 - 5.11 MIL/uL   Hemoglobin 10.8 (*) 12.0 - 15.0 g/dL   HCT 60.4 (*) 54.0 - 98.1 %   MCV 79.7  78.0 - 100.0 fL   MCH 24.9 (*) 26.0 - 34.0 pg   MCHC 31.2  30.0 - 36.0 g/dL   RDW 19.1  47.8 - 29.5 %   Platelets 329  150 - 400 K/uL   Neutrophils Relative % 65  43 - 77 %   Neutro Abs 6.4  1.7 - 7.7 K/uL   Lymphocytes Relative 21  12 - 46 %   Lymphs Abs 2.1  0.7 - 4.0 K/uL   Monocytes Relative 10  3 - 12 %   Monocytes Absolute 1.0  0.1 - 1.0 K/uL   Eosinophils Relative 4  0 - 5 %   Eosinophils Absolute 0.4  0.0 - 0.7 K/uL   Basophils Relative 0  0 - 1 %   Basophils Absolute 0.0  0.0 - 0.1 K/uL  TROPONIN I     Status: Abnormal   Collection Time    07/30/13  3:57 PM      Result Value Range   Troponin I 0.97 (*) <0.30 ng/mL   Comment:            Due to the release kinetics of cTnI,     a negative result within the first hours     of the onset of symptoms does not rule out     myocardial infarction with certainty.     If myocardial infarction is still  suspected,     repeat the test at appropriate intervals.     REPEATED TO VERIFY     CRITICAL RESULT CALLED TO, READ BACK BY AND VERIFIED WITH:     C GOFF,RN [id number] 07/30/13 WBOND  PRO B NATRIURETIC PEPTIDE     Status: Abnormal   Collection Time    07/30/13  3:57 PM      Result Value Range   Pro B Natriuretic peptide (BNP) 2555.0 (*) 0 - 450 pg/mL  COMPREHENSIVE METABOLIC PANEL     Status: Abnormal   Collection Time    07/30/13  3:57 PM      Result Value Range   Sodium 137  135 - 145 mEq/L   Potassium 4.4  3.5 - 5.1 mEq/L   Chloride 103  96 - 112 mEq/L   CO2 21  19 - 32 mEq/L   Glucose, Bld 119 (*) 70 - 99 mg/dL   BUN 29 (*) 6 - 23 mg/dL   Creatinine, Ser 6.21 (*) 0.50 - 1.10 mg/dL   Calcium 30.8  8.4 - 65.7 mg/dL   Total Protein 7.3  6.0 - 8.3 g/dL   Albumin 3.3 (*) 3.5 - 5.2 g/dL   AST 32  0 - 37 U/L   ALT 21  0 - 35 U/L   Alkaline Phosphatase 59  39 - 117 U/L   Total Bilirubin 0.4  0.3 - 1.2 mg/dL   GFR calc non Af Amer 34 (*) >90 mL/min   GFR calc Af Amer 39 (*) >90 mL/min   Comment: (NOTE)     The eGFR has been calculated using the CKD EPI equation.     This calculation has not been validated in all clinical situations.     eGFR's persistently <90 mL/min signify possible  Chronic Kidney     Disease.  URINALYSIS, ROUTINE W REFLEX MICROSCOPIC     Status: Abnormal   Collection Time    07/30/13  4:37 PM      Result Value Range   Color, Urine YELLOW  YELLOW   APPearance CLEAR  CLEAR   Specific Gravity, Urine 1.011  1.005 - 1.030   pH 5.0  5.0 - 8.0   Glucose, UA NEGATIVE  NEGATIVE mg/dL   Hgb urine dipstick NEGATIVE  NEGATIVE   Bilirubin Urine NEGATIVE  NEGATIVE   Ketones, ur NEGATIVE  NEGATIVE mg/dL   Protein, ur NEGATIVE  NEGATIVE mg/dL   Urobilinogen, UA 0.2  0.0 - 1.0 mg/dL   Nitrite NEGATIVE  NEGATIVE   Leukocytes, UA TRACE (*) NEGATIVE  URINE MICROSCOPIC-ADD ON     Status: Abnormal   Collection Time    07/30/13  4:37 PM      Result Value Range   Squamous  Epithelial / LPF FEW (*) RARE   WBC, UA 3-6  <3 WBC/hpf   RBC / HPF 0-2  <3 RBC/hpf   Bacteria, UA RARE  RARE   Casts HYALINE CASTS (*) NEGATIVE  GLUCOSE, CAPILLARY     Status: Abnormal   Collection Time    07/30/13  5:24 PM      Result Value Range   Glucose-Capillary 104 (*) 70 - 99 mg/dL   Comment 1 Orig Pt Id entered as [id number]    CG4 I-STAT (LACTIC ACID)     Status: None   Collection Time    07/30/13  5:39 PM      Result Value Range   Lactic Acid, Venous 1.76  0.5 - 2.2 mmol/L  PROTIME-INR     Status: Abnormal   Collection Time    07/30/13  6:18 PM      Result Value Range   Prothrombin Time 15.5 (*) 11.6 - 15.2 seconds   INR 1.26  0.00 - 1.49  GLUCOSE, CAPILLARY     Status: None   Collection Time    07/30/13  9:26 PM      Result Value Range   Glucose-Capillary 89  70 - 99 mg/dL  LIPASE, BLOOD     Status: None   Collection Time    07/30/13 10:00 PM      Result Value Range   Lipase 19  11 - 59 U/L  TROPONIN I     Status: Abnormal   Collection Time    07/30/13 11:25 PM      Result Value Range   Troponin I 0.64 (*) <0.30 ng/mL   Comment:            Due to the release kinetics of cTnI,     a negative result within the first hours     of the onset of symptoms does not rule out     myocardial infarction with certainty.     If myocardial infarction is still suspected,     repeat the test at appropriate intervals.     REPEATED TO VERIFY     CRITICAL VALUE NOTED.  VALUE IS CONSISTENT WITH PREVIOUSLY REPORTED AND CALLED VALUE.  TROPONIN I     Status: Abnormal   Collection Time    07/31/13  2:43 AM      Result Value Range   Troponin I 0.95 (*) <0.30 ng/mL   Comment:            Due to the release kinetics of cTnI,  a negative result within the first hours     of the onset of symptoms does not rule out     myocardial infarction with certainty.     If myocardial infarction is still suspected,     repeat the test at appropriate intervals.     REPEATED TO VERIFY      CRITICAL VALUE NOTED.  VALUE IS CONSISTENT WITH PREVIOUSLY REPORTED AND CALLED VALUE.  HEPARIN LEVEL (UNFRACTIONATED)     Status: None   Collection Time    07/31/13  2:45 AM      Result Value Range   Heparin Unfractionated 0.57  0.30 - 0.70 IU/mL   Comment:            IF HEPARIN RESULTS ARE BELOW     EXPECTED VALUES, AND PATIENT     DOSAGE HAS BEEN CONFIRMED,     SUGGEST FOLLOW UP TESTING     OF ANTITHROMBIN III LEVELS.  CBC     Status: Abnormal   Collection Time    07/31/13  2:45 AM      Result Value Range   WBC 10.0  4.0 - 10.5 K/uL   RBC 4.34  3.87 - 5.11 MIL/uL   Hemoglobin 11.0 (*) 12.0 - 15.0 g/dL   HCT 47.8 (*) 29.5 - 62.1 %   MCV 79.5  78.0 - 100.0 fL   MCH 25.3 (*) 26.0 - 34.0 pg   MCHC 31.9  30.0 - 36.0 g/dL   RDW 30.8  65.7 - 84.6 %   Platelets 313  150 - 400 K/uL  BASIC METABOLIC PANEL     Status: Abnormal   Collection Time    07/31/13  2:45 AM      Result Value Range   Sodium 140  135 - 145 mEq/L   Potassium 3.9  3.5 - 5.1 mEq/L   Chloride 106  96 - 112 mEq/L   CO2 21  19 - 32 mEq/L   Glucose, Bld 141 (*) 70 - 99 mg/dL   BUN 29 (*) 6 - 23 mg/dL   Creatinine, Ser 9.62 (*) 0.50 - 1.10 mg/dL   Calcium 95.2  8.4 - 84.1 mg/dL   GFR calc non Af Amer 30 (*) >90 mL/min   GFR calc Af Amer 35 (*) >90 mL/min   Comment: (NOTE)     The eGFR has been calculated using the CKD EPI equation.     This calculation has not been validated in all clinical situations.     eGFR's persistently <90 mL/min signify possible Chronic Kidney     Disease.  GLUCOSE, CAPILLARY     Status: Abnormal   Collection Time    07/31/13  8:02 AM      Result Value Range   Glucose-Capillary 107 (*) 70 - 99 mg/dL  TROPONIN I     Status: Abnormal   Collection Time    07/31/13  8:40 AM      Result Value Range   Troponin I 0.86 (*) <0.30 ng/mL   Comment:            Due to the release kinetics of cTnI,     a negative result within the first hours     of the onset of symptoms does not rule out      myocardial infarction with certainty.     If myocardial infarction is still suspected,     repeat the test at appropriate intervals.     REPEATED TO VERIFY  CRITICAL VALUE NOTED.  VALUE IS CONSISTENT WITH PREVIOUSLY REPORTED AND CALLED VALUE.  HEPARIN LEVEL (UNFRACTIONATED)     Status: None   Collection Time    07/31/13  9:25 AM      Result Value Range   Heparin Unfractionated 0.52  0.30 - 0.70 IU/mL   Comment:            IF HEPARIN RESULTS ARE BELOW     EXPECTED VALUES, AND PATIENT     DOSAGE HAS BEEN CONFIRMED,     SUGGEST FOLLOW UP TESTING     OF ANTITHROMBIN III LEVELS.  GLUCOSE, CAPILLARY     Status: Abnormal   Collection Time    07/31/13 12:13 PM      Result Value Range   Glucose-Capillary 150 (*) 70 - 99 mg/dL    Dg Chest 2 View  04/54/0981   CLINICAL DATA:  Shortness of breath and chest pain.  EXAM: CHEST  2 VIEW  COMPARISON:  Multiple priors  FINDINGS: Cardiac pacing device is unchanged. The cardiomediastinal silhouette is unchanged. No frank edema. Bibasilar opacities are identified, increased on the left. Similar left pleural effusion. Decreased right pleural effusion No pleural effusion or pneumothorax. No acute osseous abnormality.  IMPRESSION: 1. Bibasilar opacities, increased on the left. Findings represent atelectasis versus infiltrate.  2. Bilateral pleural effusions, similar on the left and decreased on the right.   Electronically Signed   By: Jerene Dilling M.D.   On: 07/30/2013 16:32   Assessment and plan discussed with with attending physician and they are in agreement.    Felicie Morn PA-C [facility] [phone]  07/31/2013, 2:17 PM   Patient seen and examined.  Clinical course and management discussed.  Necessary edits performed.  I agree with the above.  Assessment and plan of care developed and discussed below.    Assessment/Plan: 79 year old female with altered mental status since recent admission.  Patient per report of daughter has  physically had difficulties for some time.  Had difficulty ambulating for unknown reason and was receiving therapy at home.  Due to difficulty ambulating was doing little-not cooking, cleaning or grocery shopping.  With recent admission due to UTI had mental status changes as well that were felt to be secondary to UTI.  CT of the brain at that time has been reviewed and shows no acute changes.  Although some improved patient is not back to baseline mental status per family.   After conversation with patient and family it seems that patient may very well have had a mild dementia at baseline that was exacerbated by her recent infection.  It is not unusual for the brain to lag behind the numbers and for mental status clearing to be delayed, particularly in the elderly.  I suspect she will have further improvement but may still continue to have physical limitations since this predated her recent infection.    Recommendations: 1.  Repeat head CT without contrast 2.  EEG 3.  RPR, folate, sedimentation rate.  TSH, B12 already performed and unremarkable.    Thana Farr, MD [facility] [phone]  07/31/2013  5:42 PM

## 2013-07-31 NOTE — Progress Notes (Signed)
Patient ID: Becky Collins, female   DOB: 08-Apr-1932, 79 y.o.   MRN: [id number]    SUBJECTIVE:    The patient continues to have some confusion. She also continues to have vague chest discomfort. I have reviewed the records from the recent hospitalization. Her situation is quite complex. She has known significant coronary disease. Several months ago she had shortness of breath with complete heart block. With a pacemaker she seems somewhat improved for a short period of time. She then returned to the hospital again with chest discomfort. There was careful review. There was consideration of proceeding with PCI to the circumflex. Optimally she she could undergo CABG, but it was felt that her mental status was not improving adequately. She has not been seen in consultation by the surgeons. She now returns again with ongoing shortness of breath and chest discomfort. Her troponins are elevated. She is volume overloaded. The troponins are flat. I am not convinced yet that she's having true ischemic pain. I've spoken with one of her family members in the room. Everyone is trying to be helpful. However everyone is appropriately frustrated that we cannot seem to make progress. When she was in the hospital last time internal medicine helped with the assessment of her mental status. She never improved completely. NEUROLOGY help this admission.  Filed Vitals:   07/31/13 0438 07/31/13 0446 07/31/13 0759 07/31/13 0945  BP:  135/66 133/64   Pulse:  83 81 84  Temp: 98.5 F (36.9 C)  97.9 F (36.6 C)   TempSrc: Oral  Oral   Resp:  22 22   Height:      Weight:      SpO2:  99% 99%      Intake/Output Summary (Last 24 hours) at 07/31/13 1112 Last data filed at 07/31/13 1100  Gross per 24 hour  Intake 555.67 ml  Output   2075 ml  Net -1519.33 ml    LABS: Basic Metabolic Panel:  Recent Labs  40/98/11 1557 07/31/13 0245  NA 137 140  K 4.4 3.9  CL 103 106  CO2 21 21  GLUCOSE 119* 141*  BUN 29* 29*    CREATININE 1.44* 1.56*  CALCIUM 10.3 10.1   Liver Function Tests:  Recent Labs  07/30/13 1557  AST 32  ALT 21  ALKPHOS 59  BILITOT 0.4  PROT 7.3  ALBUMIN 3.3*    Recent Labs  07/30/13 2200  LIPASE 19   CBC:  Recent Labs  07/30/13 1557 07/31/13 0245  WBC 9.9 10.0  NEUTROABS 6.4  --   HGB 10.8* 11.0*  HCT 34.6* 34.5*  MCV 79.7 79.5  PLT 329 313   Cardiac Enzymes:  Recent Labs  07/30/13 2325 07/31/13 0243 07/31/13 0840  TROPONINI 0.64* 0.95* 0.86*   BNP: No components found with this basename: POCBNP,  D-Dimer: No results found for this basename: DDIMER,  in the last 72 hours Hemoglobin A1C: No results found for this basename: HGBA1C,  in the last 72 hours Fasting Lipid Panel: No results found for this basename: CHOL, HDL, LDLCALC, TRIG, CHOLHDL, LDLDIRECT,  in the last 72 hours Thyroid Function Tests: No results found for this basename: TSH, T4TOTAL, FREET3, T3FREE, THYROIDAB,  in the last 72 hours  RADIOLOGY: Dg Chest 2 View  07/30/2013   CLINICAL DATA:  Shortness of breath and chest pain.  EXAM: CHEST  2 VIEW  COMPARISON:  Multiple priors  FINDINGS: Cardiac pacing device is unchanged. The cardiomediastinal silhouette is unchanged. No frank edema.  Bibasilar opacities are identified, increased on the left. Similar left pleural effusion. Decreased right pleural effusion No pleural effusion or pneumothorax. No acute osseous abnormality.  IMPRESSION: 1. Bibasilar opacities, increased on the left. Findings represent atelectasis versus infiltrate.  2. Bilateral pleural effusions, similar on the left and decreased on the right.   Electronically Signed   By: Jerene Dilling M.D.   On: 07/30/2013 16:32   Dg Chest 2 View  07/26/2013   CLINICAL DATA:  Decreased breath sounds at the right base.  EXAM: CHEST  2 VIEW  COMPARISON:  Multiple priors  FINDINGS: Cardiac pacing device is unchanged. The cardiomediastinal silhouette is unchanged. No frank edema. Bibasilar  opacities. Low lung volumes. No pneumothorax. Bilateral small pleural effusions.  IMPRESSION: Bibasilar opacities, likely representing atelectasis. Developing infection is not excluded. Small bilateral pleural effusions.   Electronically Signed   By: Jerene Dilling M.D.   On: 07/26/2013 09:47   Ct Head Wo Contrast  07/22/2013   CLINICAL DATA:  History of hypertension, diabetes, and heart disease. Confusion.  EXAM: CT HEAD WITHOUT CONTRAST  TECHNIQUE: Contiguous axial images were obtained from the base of the skull through the vertex without contrast.  COMPARISON:  07/20/2013.  FINDINGS: Stable atrophy and chronic microvascular ischemic change. No evidence for acute infarction, hemorrhage, mass lesion, hydrocephalus, or extra-axial fluid. Calvarium intact. Advanced vascular calcification. Minimal sinus fluid is stable. No mastoid inflammatory process.  IMPRESSION: Stable atrophy and chronic microvascular ischemic change. No acute intracranial findings.   Electronically Signed   By: Davonna Belling M.D.   On: 07/22/2013 18:27   Ct Head Wo Contrast  07/20/2013   *RADIOLOGY REPORT*  Clinical Data: Confusion  CT HEAD WITHOUT CONTRAST  Technique:  Contiguous axial images were obtained from the base of the skull through the vertex without contrast.  Comparison: Prior MRI from 08/16/2011  Findings: Mild prominence of the CSF containing spaces was compatible with generalized atrophy. Scattered and confluent hypodensities within the periventricular deep white matter most compatible with chronic microvascular ischemic disease.  Remote right parietal infarct is noted, unchanged as compared to prior MRI.  No acute intracranial hemorrhage is identified.  There is no large vessel territory infarct.  No extra-axial fluid collection. No midline shift or mass lesion.  Calvarium is intact.  Orbital soft tissues are within normal limits.  Paranasal sinuses and mastoid air cells are clear.  IMPRESSION: 1. No acute intracranial  process.  2. Unchanged remote right parietal infarct.  3. Mild age related atrophy and chronic microvascular ischemic disease, similar to prior.   Original Report Authenticated By: Rise Mu, M.D.    PHYSICAL EXAM    The patient responds to questions but she is not fully oriented. Family members in the room. There is no jugulovenous distention. Lungs reveal scattered rhonchi. Cardiac exam reveals S1 and S2. The abdomen is soft. There is 1+ peripheral edema.   TELEMETRY:    I've reviewed telemetry today July 31, 2013. There is a paced rhythm.   ASSESSMENT AND PLAN:    DM (diabetes mellitus), type 2,      Patient is receiving appropriate medications. We have not documented hypoglycemia as the basis of any of her problems.    Carotid artery disease     There is moderate carotid disease.    Ejection fraction     Her most recent echo showed that her left ventricular function is good.    Mitral regurgitation    We know that she has moderate mitral  regurgitation.    History of seizure disorder    The patient had been on Depakote for her seizures in the past. This drug was stopped many months ago. We have not seen any clear-cut seizure activity.    CKD (chronic kidney disease), stage III    We will watch renal function very carefully as we try to diuresis her.    UTI (urinary tract infection)     In reviewing her labs, the most recent organism she grew in her urine is sensitive to ampicillin. She had been on Keflex. This was not mentioned in the sensitivity report. She has been switched to ampicillin.    Shortness of breath     Etiology of her shortness of breath remains unclear. At first she seemed somewhat better when her pacemaker was placed. There is some volume overload. We will try to aggressively diuresis her watching her renal function. I'm not sure if this will help her shortness of breath or not.    CAD (coronary artery disease)     She has significant coronary  disease. Each time we get closer proceeding with an intervention we decide not to based on her mental status. This is an ongoing issue.    Pacemaker      Her pacemaker is working adequately.    Abnormal mini-mental status exam     We are consulting neurology today to see if we can get further help concerning her mental status. This continues to be a significant problem.    Elevated troponin     Patient has elevated troponins are flat. This may be from CHF, but this is not clear. So far I am still not convinced of an acute ischemic event.    Chest pain     Etiology of her chest pain is very difficult. She also has several different pains during the day.  I am checking 1 troponin again tomorrow to see the trend. I've ordered total CPK to be sure we're not missing a muscle issue. I have also stop simvastatin to see if this is playing any role with her aches and pains.  Before the patient can go home this admission we have to all be in agreement that we have optimize the assessment of her mental status, we have optimized her volume and renal status, and we have made final decisions about the approach to her coronary disease.  As part of today's evaluation I spent greater than 35 minutes with her overall care. I've actually spent a great deal longer.  Willa Rough 07/31/2013 11:12 AM

## 2013-07-31 NOTE — Progress Notes (Signed)
ANTICOAGULATION CONSULT NOTE - Follow Up Consult  Pharmacy Consult for Heparin  Indication: chest pain/ACS  No Known Allergies  Patient Measurements: Height: 5\' 6"  (167.6 cm) Weight: 191 lb 2.2 oz (86.7 kg) IBW/kg (Calculated) : 59.3 Heparin Dosing Weight: ~70 kg  Vital Signs: Temp: 98.2 F (36.8 C) (10/22 0005) Temp src: Oral (10/22 0005) BP: 124/66 mmHg (10/22 0300) Pulse Rate: 75 (10/22 0300)  Labs:  Recent Labs  07/30/13 1557 07/30/13 1818 07/30/13 2325 07/31/13 0245  HGB 10.8*  --   --  11.0*  HCT 34.6*  --   --  34.5*  PLT 329  --   --  313  LABPROT  --  15.5*  --   --   INR  --  1.26  --   --   HEPARINUNFRC  --   --   --  0.57  CREATININE 1.44*  --   --   --   TROPONINI 0.97*  --  0.64*  --     Estimated Creatinine Clearance: 35.2 ml/min (by C-G formula based on Cr of 1.44).   Medications:  Heparin at 1000 units/hr  Assessment: 79 y/o F on heparin for CP/ACS. First HL is 0.57, Hgb 11, Plts 313, Scr 1.44, no overt bleeding noted.   Goal of Therapy:  Heparin level 0.3-0.7 units/ml Monitor platelets by anticoagulation protocol: Yes   Plan:  -Continue heparin infusion at 1000 units/hr -8 hour HL at 1000 -Daily CBC/HL -Monitor for bleeding -F/U cardiology plans  Thank you for allowing me to take part in this patient's care,  Abran Duke, PharmD Clinical Pharmacist Phone: [phone] Pager: [phone] 07/31/2013 3:20 AM

## 2013-07-31 NOTE — Care Management Note (Addendum)
    Page 1 of 2   08/05/2013     2:55:05 PM   CARE MANAGEMENT NOTE 08/05/2013  Patient:  Becky Collins, Becky Collins   Account Number:  [id number]  Date Initiated:  07/31/2013  Documentation initiated by:  Junius Creamer  Subjective/Objective Assessment:   adm w mi     Action/Plan:   lives w da, hx of ahc   Anticipated DC Date:     Anticipated DC Plan:  HOME W HOME HEALTH SERVICES      DC Planning Services  CM consult      [facility] Choice  Resumption Of Svcs/PTA Provider   Choice offered to / List presented to:          [facility] arranged  HH-1 RN  HH-2 PT  HH-3 OT      [facility] agency  [facility]   Status of service:   Medicare Important Message given?   (If response is "NO", the following Medicare IM given date fields will be blank) Date Medicare IM given:   Date Additional Medicare IM given:    Discharge Disposition:  HOME W HOME HEALTH SERVICES  Per UR Regulation:  Reviewed for med. necessity/level of care/duration of stay  If discussed at Long Length of Stay Meetings, dates discussed:   08/06/2013    Comments:  08-05-13 1452 Tomi Bamber[place], [place] [phone] Pt continues to be diuresed woth IV lasix. PT recommends SNF. CM did call CSW to assess needs. CM will continue to monitor.

## 2013-07-31 NOTE — Progress Notes (Signed)
[facility]  Patient Status: Active (receiving services up to time of hospitalization)  [facility] is providing the following services: RN, PT and OT  If patient discharges after hours, please call [phone].   Becky Collins 07/31/2013, 5:01 PM

## 2013-07-31 NOTE — Progress Notes (Signed)
ANTICOAGULATION CONSULT NOTE   Pharmacy Consult for Heparin  Indication: chest pain/ACS  No Known Allergies  Patient Measurements: Height: 5\' 6"  (167.6 cm) Weight: 191 lb 2.2 oz (86.7 kg) IBW/kg (Calculated) : 59.3 Heparin Dosing Weight: ~70 kg  Vital Signs: Temp: 97.9 F (36.6 C) (10/22 0759) Temp src: Oral (10/22 0759) BP: 133/64 mmHg (10/22 0759) Pulse Rate: 84 (10/22 0945)  Labs:  Recent Labs  07/30/13 1557 07/30/13 1818 07/30/13 2325 07/31/13 0243 07/31/13 0245 07/31/13 0840 07/31/13 0925  HGB 10.8*  --   --   --  11.0*  --   --   HCT 34.6*  --   --   --  34.5*  --   --   PLT 329  --   --   --  313  --   --   LABPROT  --  15.5*  --   --   --   --   --   INR  --  1.26  --   --   --   --   --   HEPARINUNFRC  --   --   --   --  0.57  --  0.52  CREATININE 1.44*  --   --   --  1.56*  --   --   TROPONINI 0.97*  --  0.64* 0.95*  --  0.86*  --     Estimated Creatinine Clearance: 32.5 ml/min (by C-G formula based on Cr of 1.56).   Medications:  Heparin at 1000 units/hr  Assessment: 79 y/o F on heparin for CP/ACS. HL is 0.5 x2, Hgb 11, Plts 313, Scr 1.44, no overt bleeding noted. No cath planned at this time.  Goal of Therapy:  Heparin level 0.3-0.7 units/ml Monitor platelets by anticoagulation protocol: Yes   Plan:  -Continue heparin infusion at 1000 units/hr -Daily CBC/HL -Monitor for bleeding -F/U cardiology plans  Thank you for allowing me to take part in this patient's care,  Sheppard Coil PharmD., BCPS Clinical Pharmacist Pager [phone] 07/31/2013 11:10 AM

## 2013-08-01 ENCOUNTER — Inpatient Hospital Stay (HOSPITAL_COMMUNITY): Payer: Medicare Other

## 2013-08-01 DIAGNOSIS — I2510 Atherosclerotic heart disease of native coronary artery without angina pectoris: Secondary | ICD-10-CM

## 2013-08-01 DIAGNOSIS — I70219 Atherosclerosis of native arteries of extremities with intermittent claudication, unspecified extremity: Secondary | ICD-10-CM

## 2013-08-01 DIAGNOSIS — J9819 Other pulmonary collapse: Secondary | ICD-10-CM

## 2013-08-01 LAB — BASIC METABOLIC PANEL
BUN: 32 mg/dL — ABNORMAL HIGH (ref 6–23)
BUN: 31 mg/dL — ABNORMAL HIGH (ref 6–23)
Calcium: 9.4 mg/dL (ref 8.4–10.5)
Calcium: 9.3 mg/dL (ref 8.4–10.5)
Creatinine, Ser: 1.75 mg/dL — ABNORMAL HIGH (ref 0.50–1.10)
GFR calc Af Amer: 32 mL/min — ABNORMAL LOW (ref >90)
GFR calc non Af Amer: 27 mL/min — ABNORMAL LOW (ref >90)
GFR calc non Af Amer: 27 mL/min — ABNORMAL LOW (ref >90)
Glucose, Bld: 160 mg/dL — ABNORMAL HIGH (ref 70–99)
Glucose, Bld: 206 mg/dL — ABNORMAL HIGH (ref 70–99)
Sodium: 133 mEq/L — ABNORMAL LOW (ref 135–145)
Sodium: 137 mEq/L (ref 135–145)

## 2013-08-01 LAB — FOLATE: Folate: >20 ng/mL

## 2013-08-01 LAB — CBC
HCT: 31.8 % — ABNORMAL LOW (ref 36.0–46.0)
Hemoglobin: 9.9 g/dL — ABNORMAL LOW (ref 12.0–15.0)
MCH: 24.8 pg — ABNORMAL LOW (ref 26.0–34.0)
MCHC: 31.1 g/dL (ref 30.0–36.0)
MCV: 79.7 fL (ref 78.0–100.0)
RDW: 15.9 % — ABNORMAL HIGH (ref 11.5–15.5)

## 2013-08-01 LAB — TROPONIN I: Troponin I: 0.59 ng/mL (ref <0.30)

## 2013-08-01 LAB — HEPARIN LEVEL (UNFRACTIONATED): Heparin Unfractionated: 0.34 IU/mL (ref 0.30–0.70)

## 2013-08-01 LAB — GLUCOSE, CAPILLARY
Glucose-Capillary: 140 mg/dL — ABNORMAL HIGH (ref 70–99)
Glucose-Capillary: 169 mg/dL — ABNORMAL HIGH (ref 70–99)
Glucose-Capillary: 231 mg/dL — ABNORMAL HIGH (ref 70–99)

## 2013-08-01 LAB — SEDIMENTATION RATE: Sed Rate: 72 mm/hr — ABNORMAL HIGH (ref 0–22)

## 2013-08-01 MED ORDER — ASPIRIN 81 MG PO CHEW
81.0000 mg | CHEWABLE_TABLET | ORAL | Status: AC
  Administered 2013-08-02: 81 mg via ORAL
  Filled 2013-08-01: qty 1

## 2013-08-01 MED ORDER — SODIUM CHLORIDE 0.9 % IV SOLN: 250.0000 mL | INTRAVENOUS | Status: DC | PRN

## 2013-08-01 MED ORDER — INSULIN GLARGINE 100 UNIT/ML ~~LOC~~ SOLN: 25.0000 [IU] | Freq: Every day | SUBCUTANEOUS | Status: DC

## 2013-08-01 MED ORDER — SODIUM CHLORIDE 0.9 % IJ SOLN: 3.0000 mL | INTRAMUSCULAR | Status: DC | PRN

## 2013-08-01 MED ORDER — INSULIN GLARGINE 100 UNIT/ML ~~LOC~~ SOLN
25.0000 [IU] | Freq: Every day | SUBCUTANEOUS | Status: DC
  Administered 2013-08-01: 25 [IU] via SUBCUTANEOUS
  Filled 2013-08-01 (×2): qty 0.25

## 2013-08-01 MED ORDER — SODIUM CHLORIDE 0.9 % IV SOLN
INTRAVENOUS | Status: DC
  Administered 2013-08-01 (×2): via INTRAVENOUS

## 2013-08-01 MED ORDER — SODIUM CHLORIDE 0.9 % IJ SOLN: 3.0000 mL | Freq: Two times a day (BID) | INTRAMUSCULAR | Status: DC

## 2013-08-01 NOTE — Progress Notes (Signed)
EEG Completed; Results Pending  

## 2013-08-01 NOTE — Progress Notes (Signed)
Subjective: Patient with a good night per patient and family.  Alert and oriented today.    Objective: Current vital signs: BP 132/64  Pulse 76  Temp(Src) 97.9 F (36.6 C) (Oral)  Resp 18  Ht 5\' 6"  (1.676 m)  Wt 85.3 kg (188 lb 0.8 oz)  BMI 30.37 kg/m2  SpO2 99% Vital signs in last 24 hours: Temp:  [97.7 F (36.5 C)-98.6 F (37 C)] 97.9 F (36.6 C) (10/23 0831) Pulse Rate:  [72-84] 76 (10/23 0831) Resp:  [18-25] 18 (10/22 1739) BP: (114-159)/(49-66) 132/64 mmHg (10/23 0831) SpO2:  [97 %-99 %] 99 % (10/23 0831) Weight:  [85.3 kg (188 lb 0.8 oz)] 85.3 kg (188 lb 0.8 oz) (10/23 4098)  Intake/Output from previous day: 10/22 0701 - 10/23 0700 In: 820 [P.O.:360; I.V.:460] Out: 1500 [Urine:1500] Intake/Output this shift:   Nutritional status: Carb Control  Neurologic Exam: Mental Status:  Alert and oriented.  Follows commands.  Speech fluent.  Cranial Nerves:  II: Discs flat bilaterally; Visual fields grossly normal, pupils equal, round, reactive to light and accommodation  III,IV, VI: ptosis not present, extra-ocular motions intact bilaterally  V,VII: smile slightly asymmetric on the left, facial light touch sensation normal bilaterally  VIII: hearing normal bilaterally  IX,X: gag reflex present  XI: bilateral shoulder shrug  XII: midline tongue extension without atrophy or fasciculations  Motor:  5/5 throughout Sensory: Pinprick and light touch intact throughout, bilaterally  Deep Tendon Reflexes:  2+ in the upper extremities and absent in the lower extremities  Plantars:  Right: downgoing  Left: downgoing  Cerebellar:  normal finger-to-nose, normal heel-to-shin test    Lab Results: Basic Metabolic Panel:  Recent Labs Lab 07/30/13 1557 07/31/13 0245 08/01/13 0615  NA 137 140 133*  K 4.4 3.9 4.2  CL 103 106 98  CO2 21 21 20   GLUCOSE 119* 141* 160*  BUN 29* 29* 32*  CREATININE 1.44* 1.56* 1.75*  CALCIUM 10.3 10.1 9.4    Liver Function Tests:  Recent  Labs Lab 07/30/13 1557  AST 32  ALT 21  ALKPHOS 59  BILITOT 0.4  PROT 7.3  ALBUMIN 3.3*    Recent Labs Lab 07/30/13 2200  LIPASE 19   No results found for this basename: AMMONIA,  in the last 168 hours  CBC:  Recent Labs Lab 07/26/13 0442 07/30/13 1557 07/31/13 0245 08/01/13 0615  WBC 7.1 9.9 10.0 8.9  NEUTROABS  --  6.4  --   --   HGB 10.2* 10.8* 11.0* 9.9*  HCT 33.4* 34.6* 34.5* 31.8*  MCV 80.3 79.7 79.5 79.7  PLT 255 329 313 326    Cardiac Enzymes:  Recent Labs Lab 07/30/13 1557 07/30/13 2325 07/31/13 0243 07/31/13 0840 08/01/13 0615  CKTOTAL  --   --   --   --  84  TROPONINI 0.97* 0.64* 0.95* 0.86* 0.59*    Lipid Panel: No results found for this basename: CHOL, TRIG, HDL, CHOLHDL, VLDL, LDLCALC,  in the last 168 hours  CBG:  Recent Labs Lab 07/31/13 0802 07/31/13 1213 07/31/13 1741 07/31/13 2203 08/01/13 0832  GLUCAP 107* 150* 119* 190* 140*    Microbiology: Results for orders placed during the hospital encounter of 07/30/13  URINE CULTURE     Status: None   Collection Time    07/30/13  5:17 PM      Result Value Range Status   Specimen Description URINE, CLEAN CATCH   Final   Special Requests NONE   Final   Culture  Setup Time     Final   Value: 07/30/2013 18:37     Performed at [facility] Count     Final   Value: NO GROWTH     Performed at [facility]   Culture     Final   Value: NO GROWTH     Performed at [facility]   Report Status 07/31/2013 FINAL   Final    Coagulation Studies:  Recent Labs  07/30/13 1818  LABPROT 15.5*  INR 1.26    Imaging: Dg Chest 2 View  07/30/2013   CLINICAL DATA:  Shortness of breath and chest pain.  EXAM: CHEST  2 VIEW  COMPARISON:  Multiple priors  FINDINGS: Cardiac pacing device is unchanged. The cardiomediastinal silhouette is unchanged. No frank edema. Bibasilar opacities are identified, increased on the left. Similar left pleural effusion. Decreased  right pleural effusion No pleural effusion or pneumothorax. No acute osseous abnormality.  IMPRESSION: 1. Bibasilar opacities, increased on the left. Findings represent atelectasis versus infiltrate.  2. Bilateral pleural effusions, similar on the left and decreased on the right.   Electronically Signed   By: Jerene Dilling M.D.   On: 07/30/2013 16:32   Ct Head Wo Contrast  07/31/2013   CLINICAL DATA:  Stroke.  EXAM: CT HEAD WITHOUT CONTRAST  TECHNIQUE: Contiguous axial images were obtained from the base of the skull through the vertex without intravenous contrast.  COMPARISON:  CT 07/22/2013  FINDINGS: Generalized atrophy. Negative for acute infarct. Negative for hemorrhage or mass.  Mucosal edema in the sphenoid sinus. No acute bony abnormality.  IMPRESSION: No acute intracranial abnormality.   Electronically Signed   By: Marlan Palau M.D.   On: 07/31/2013 21:49    Medications:  I have reviewed the patient's current medications. Scheduled: . amLODipine  5 mg Oral Daily  . ampicillin  250 mg Oral Q6H  . aspirin  81 mg Oral QHS  . brimonidine  1 drop Both Eyes BID  . calcium-vitamin D  1 tablet Oral Q breakfast  . cholecalciferol  1,000 Units Oral Daily  . furosemide  80 mg Intravenous Daily  . insulin aspart  0-9 Units Subcutaneous TID WC  . insulin glargine  54 Units Subcutaneous QHS  . isosorbide mononitrate  30 mg Oral Daily  . metoprolol tartrate  25 mg Oral BID  . multivitamin with minerals  1 tablet Oral Daily  . pantoprazole  40 mg Oral Daily  . sertraline  100 mg Oral q morning - 10a  . sodium chloride  3 mL Intravenous Q12H    Assessment/Plan: Patient seems to be doing well today.  Repeat CT of the head reviewed and shows no acute changes.  Lab work unremarkable other than ESR which is 85 and likely related to recent infection.    Recommendations: 1.  EEG pending     LOS: 2 days   Thana Farr, MD [facility] [phone] 08/01/2013  9:18  AM

## 2013-08-01 NOTE — Procedures (Signed)
ELECTROENCEPHALOGRAM REPORT   Patient: Becky Collins       Room #: 9W11 EEG No. ID: [id number] Age: 79 y.o.        Sex: female Referring Physician: Myrtis Ser Report Date:  08/01/2013        Interpreting Physician: Thana Farr D  History: [name] is an 79 y.o. female with altered mental status  Medications:  Scheduled: . amLODipine  5 mg Oral Daily  . ampicillin  250 mg Oral Q6H  . aspirin  81 mg Oral QHS  . brimonidine  1 drop Both Eyes BID  . calcium-vitamin D  1 tablet Oral Q breakfast  . cholecalciferol  1,000 Units Oral Daily  . insulin aspart  0-9 Units Subcutaneous TID WC  . insulin glargine  54 Units Subcutaneous QHS  . isosorbide mononitrate  30 mg Oral Daily  . metoprolol tartrate  25 mg Oral BID  . multivitamin with minerals  1 tablet Oral Daily  . pantoprazole  40 mg Oral Daily  . sertraline  100 mg Oral q morning - 10a  . sodium chloride  3 mL Intravenous Q12H    Conditions of Recording:  This is a 16 channel EEG carried out with the patient in the briefly awake, drowsy and asleep states.  Description:  The waking background activity is noted briefly and consists of a low voltage, symmetrical, fairly well organized, 8 Hz alpha activity, seen from the parieto-occipital and posterior temporal regions.  Low voltage fast activity, poorly organized, is seen anteriorly and is at times superimposed on more posterior regions.  A mixture of theta and alpha rhythms are seen from the central and temporal regions. The patient drowses with slowing to irregular, low voltage theta and beta activity.   The patient goes in to a light sleep with symmetrical sleep spindles, vertex central sharp transients and irregular slow activity.   Hyperventilation and intermittent photic stimulation were not performed.  IMPRESSION: This is a normal electroencephalogram    Thana Farr, MD [facility] [phone] 08/01/2013, 7:06 PM

## 2013-08-01 NOTE — Progress Notes (Signed)
Patient's MS is better this PM Alert, oriented  I spoke with neurology  (Dr Thad Ranger) She has not seen EEG yet.  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  Slowly recovering.  WIth this feel surgery would be very difficult  Patient is not that ambulatory either. Spent 30 min talking to patient and family  I have also reviewed cath from September with [facility].  Patient with 2 Cx lesions that could benefit from PTCA/Stent.  OM is small  LAD does not appear severe.  RCA is occluded.  WOuld tentatively plan for PCI tomorrow of L Cx  Hydrate tonight  Check labs in AM  Patient and familly understand benefits, risks.  OK to proceed.    Note family has concerns about her diabetic regimen  It was changed at last admission  With her living situation and their work they cannot do 3x per day insulin.  Will have diabetic coordinator evaluate.

## 2013-08-01 NOTE — Progress Notes (Signed)
ANTICOAGULATION CONSULT NOTE   Pharmacy Consult for Heparin  Indication: chest pain/ACS  No Known Allergies  Patient Measurements: Height: 5\' 6"  (167.6 cm) Weight: 188 lb 0.8 oz (85.3 kg) IBW/kg (Calculated) : 59.3 Heparin Dosing Weight: ~70 kg  Vital Signs: Temp: 97.9 F (36.6 C) (10/23 0831) Temp src: Oral (10/23 0831) BP: 132/64 mmHg (10/23 0831) Pulse Rate: 76 (10/23 0831)  Labs:  Recent Labs  07/30/13 1557 07/30/13 1818  07/31/13 0243 07/31/13 0245 07/31/13 0840 07/31/13 0925 08/01/13 0615  HGB 10.8*  --   --   --  11.0*  --   --  9.9*  HCT 34.6*  --   --   --  34.5*  --   --  31.8*  PLT 329  --   --   --  313  --   --  326  LABPROT  --  15.5*  --   --   --   --   --   --   INR  --  1.26  --   --   --   --   --   --   HEPARINUNFRC  --   --   --   --  0.57  --  0.52 0.34  CREATININE 1.44*  --   --   --  1.56*  --   --  1.75*  CKTOTAL  --   --   --   --   --   --   --  84  TROPONINI 0.97*  --   < > 0.95*  --  0.86*  --  0.59*  < > = values in this interval not displayed.  Estimated Creatinine Clearance: 28.7 ml/min (by C-G formula based on Cr of 1.75).   Medications:  Heparin at 1000 units/hr  Assessment: 79 y/o F on heparin for CP/ACS. HL is at goal. Hgb down slightly to 9.9 Plts 326, Scr 1.7, no overt bleeding noted. No cath planned at this time.  Goal of Therapy:  Heparin level 0.3-0.7 units/ml Monitor platelets by anticoagulation protocol: Yes   Plan:  -Continue heparin infusion at 1000 units/hr -Daily CBC/HL -Monitor for bleeding -F/U cardiology plans  Thank you for allowing me to take part in this patient's care,  Sheppard Coil PharmD., BCPS Clinical Pharmacist Pager [phone] 08/01/2013 9:35 AM

## 2013-08-01 NOTE — Progress Notes (Signed)
Subjective: Patient SOB early AM  Minimal chest tightness R and L   Objective: Filed Vitals:   07/31/13 2155 08/01/13 0400 08/01/13 0634 08/01/13 0657  BP: 126/51   123/66  Pulse:      Temp:  98.6 F (37 C)    TempSrc:  Oral    Resp:      Height:      Weight:   188 lb 0.8 oz (85.3 kg)   SpO2:       Weight change: 8 lb 0.8 oz (3.653 kg)  Intake/Output Summary (Last 24 hours) at 08/01/13 0800 Last data filed at 08/01/13 0600  Gross per 24 hour  Intake    800 ml  Output   1125 ml  Net   -325 ml    General: Alert, awake, oriented x3, in no acute distress Neck:  JVP is normal Heart: Regular rate and rhythm, without murmurs, rubs, gallops.  Lungs: Clear to auscultation.  No rales or wheezes. Exemities:  No edema.   Neuro: Grossly intact, nonfocal.  Tele:  SR  Paced  Lab Results: Results for orders placed during the hospital encounter of 07/30/13 (from the past 24 hour(s))  GLUCOSE, CAPILLARY     Status: Abnormal   Collection Time    07/31/13  8:02 AM      Result Value Range   Glucose-Capillary 107 (*) 70 - 99 mg/dL  TROPONIN I     Status: Abnormal   Collection Time    07/31/13  8:40 AM      Result Value Range   Troponin I 0.86 (*) <0.30 ng/mL  HEPARIN LEVEL (UNFRACTIONATED)     Status: None   Collection Time    07/31/13  9:25 AM      Result Value Range   Heparin Unfractionated 0.52  0.30 - 0.70 IU/mL  GLUCOSE, CAPILLARY     Status: Abnormal   Collection Time    07/31/13 12:13 PM      Result Value Range   Glucose-Capillary 150 (*) 70 - 99 mg/dL  GLUCOSE, CAPILLARY     Status: Abnormal   Collection Time    07/31/13  5:41 PM      Result Value Range   Glucose-Capillary 119 (*) 70 - 99 mg/dL  FOLATE     Status: None   Collection Time    07/31/13  7:00 PM      Result Value Range   Folate >20.0    RPR     Status: None   Collection Time    07/31/13  7:00 PM      Result Value Range   RPR NON REACTIVE  NON REACTIVE  SEDIMENTATION RATE     Status: Abnormal   Collection Time    07/31/13  8:30 PM      Result Value Range   Sed Rate 85 (*) 0 - 22 mm/hr  GLUCOSE, CAPILLARY     Status: Abnormal   Collection Time    07/31/13 10:03 PM      Result Value Range   Glucose-Capillary 190 (*) 70 - 99 mg/dL  CBC     Status: Abnormal   Collection Time    08/01/13  6:15 AM      Result Value Range   WBC 8.9  4.0 - 10.5 K/uL   RBC 3.99  3.87 - 5.11 MIL/uL   Hemoglobin 9.9 (*) 12.0 - 15.0 g/dL   HCT 95.6 (*) 21.3 - 08.6 %   MCV 79.7  78.0 - 100.0  fL   MCH 24.8 (*) 26.0 - 34.0 pg   MCHC 31.1  30.0 - 36.0 g/dL   RDW 14.7 (*) 82.9 - 56.2 %   Platelets 326  150 - 400 K/uL  HEPARIN LEVEL (UNFRACTIONATED)     Status: None   Collection Time    08/01/13  6:15 AM      Result Value Range   Heparin Unfractionated 0.34  0.30 - 0.70 IU/mL    Studies/Results: @RISRSLT24 @  Medications: Reviewed   @PROBHOSP @  1.  Confusion  Patient more alert today Daughter says it was last week (last admit) when things were bad Neuro has seen  CT and labs unremarkable although ESR ins increased at 85 (Seems high for recent infection) EEG today then would ask neuro to make conclusion.  2.  CAD  Keep on current medicines for now.  Will need to make decision re revasc (PCI or CABG)    LOS: 2 days   Dietrich Pates 08/01/2013, 8:00 AM

## 2013-08-02 ENCOUNTER — Encounter (HOSPITAL_COMMUNITY)
Admission: EM | Disposition: A | Discharge status: Skilled Nursing Facility | Payer: Medicare Other | Source: Home / Self Care | Attending: Cardiology

## 2013-08-02 DIAGNOSIS — I2510 Atherosclerotic heart disease of native coronary artery without angina pectoris: Secondary | ICD-10-CM

## 2013-08-02 HISTORY — PX: PERCUTANEOUS CORONARY STENT INTERVENTION (PCI-S): SHX5485

## 2013-08-02 HISTORY — PX: PERCUTANEOUS CORONARY ROTOBLATOR INTERVENTION (PCI-R): SHX5484

## 2013-08-02 LAB — HEPARIN LEVEL (UNFRACTIONATED): Heparin Unfractionated: 0.57 IU/mL (ref 0.30–0.70)

## 2013-08-02 LAB — BASIC METABOLIC PANEL
Calcium: 9.2 mg/dL (ref 8.4–10.5)
Chloride: 101 mEq/L (ref 96–112)
Creatinine, Ser: 1.59 mg/dL — ABNORMAL HIGH (ref 0.50–1.10)
GFR calc non Af Amer: 30 mL/min — ABNORMAL LOW (ref >90)
Glucose, Bld: 209 mg/dL — ABNORMAL HIGH (ref 70–99)
Sodium: 137 mEq/L (ref 135–145)

## 2013-08-02 LAB — CBC
MCH: 25.2 pg — ABNORMAL LOW (ref 26.0–34.0)
MCHC: 31.4 g/dL (ref 30.0–36.0)
MCV: 80.2 fL (ref 78.0–100.0)
Platelets: 325 10*3/uL (ref 150–400)

## 2013-08-02 LAB — GLUCOSE, CAPILLARY: Glucose-Capillary: 143 mg/dL — ABNORMAL HIGH (ref 70–99)

## 2013-08-02 SURGERY — PERCUTANEOUS CORONARY STENT INTERVENTION (PCI-S)
Anesthesia: LOCAL

## 2013-08-02 MED ORDER — ALUM & MAG HYDROXIDE-SIMETH 200-200-20 MG/5ML PO SUSP
30.0000 mL | Freq: Four times a day (QID) | ORAL | Status: DC | PRN
  Administered 2013-08-02: 20:00:00 30 mL via ORAL
  Filled 2013-08-02: qty 30

## 2013-08-02 MED ORDER — ASPIRIN 81 MG PO CHEW
CHEWABLE_TABLET | ORAL | Status: AC
  Filled 2013-08-02: qty 1

## 2013-08-02 MED ORDER — SODIUM CHLORIDE 0.9 % IV SOLN: 1.0000 mL/kg/h | INTRAVENOUS | Status: AC

## 2013-08-02 MED ORDER — HEPARIN SODIUM (PORCINE) 5000 UNIT/ML IJ SOLN
5000.0000 [IU] | Freq: Three times a day (TID) | INTRAMUSCULAR | Status: DC
  Administered 2013-08-02 – 2013-08-09 (×20): 5000 [IU] via SUBCUTANEOUS
  Filled 2013-08-02 (×23): qty 1

## 2013-08-02 MED ORDER — MIDAZOLAM HCL 2 MG/2ML IJ SOLN
INTRAMUSCULAR | Status: AC
  Filled 2013-08-02: qty 2

## 2013-08-02 MED ORDER — LIVING WELL WITH DIABETES BOOK
Freq: Once | Status: AC
Start: 2013-08-02 — End: 2013-08-02
  Administered 2013-08-02: 18:00:00
  Filled 2013-08-02: qty 1

## 2013-08-02 MED ORDER — CLOPIDOGREL BISULFATE 75 MG PO TABS
75.0000 mg | ORAL_TABLET | Freq: Every day | ORAL | Status: DC
  Administered 2013-08-03 – 2013-08-09 (×7): 75 mg via ORAL
  Filled 2013-08-02 (×7): qty 1

## 2013-08-02 MED ORDER — CLOPIDOGREL BISULFATE 300 MG PO TABS
ORAL_TABLET | ORAL | Status: AC
  Filled 2013-08-02: qty 1

## 2013-08-02 MED ORDER — HEPARIN SODIUM (PORCINE) 1000 UNIT/ML IJ SOLN
INTRAMUSCULAR | Status: AC
  Filled 2013-08-02: qty 1

## 2013-08-02 MED ORDER — ALUM & MAG HYDROXIDE-SIMETH 200-200-20 MG/5ML PO SUSP: 30.0000 mL | Freq: Four times a day (QID) | ORAL | Status: DC | PRN

## 2013-08-02 MED ORDER — ONDANSETRON HCL 4 MG/2ML IJ SOLN
INTRAMUSCULAR | Status: AC
  Filled 2013-08-02: qty 2

## 2013-08-02 MED ORDER — SODIUM CHLORIDE 0.9 % IV SOLN
0.2500 mg/kg/h | INTRAVENOUS | Status: DC
  Filled 2013-08-02: qty 250

## 2013-08-02 MED ORDER — FENTANYL CITRATE 0.05 MG/ML IJ SOLN
INTRAMUSCULAR | Status: AC
  Filled 2013-08-02: qty 2

## 2013-08-02 MED ORDER — VERAPAMIL HCL 2.5 MG/ML IV SOLN
INTRAVENOUS | Status: AC
  Filled 2013-08-02: qty 2

## 2013-08-02 MED ORDER — BIVALIRUDIN 250 MG IV SOLR
INTRAVENOUS | Status: AC
  Filled 2013-08-02: qty 250

## 2013-08-02 NOTE — Progress Notes (Signed)
Not coming back, belongings taken by daughter.

## 2013-08-02 NOTE — Progress Notes (Signed)
Spoke with patient and family about her diabetes.  Was diagnosed about 15 years ago.  Sees Dr. Selinda Flavin as her PCP.  Recently discharged from the hospital on Lantus 54 units at bedtime and Novolog 6 units TID with meals.  Daughter and granddaughter expressed concern about patient taking midday insulin due to care at that time of day.   Suggest that she could try Novolin 70/30 mixed insulin that would be given twice a day with breakfast and supper.  This does come in a pen form which the family has used for the Lantus.  Recommend Novolin 70/30 insulin pen: 40 units twice a day that would be close to the dosage of Lantus 54 units she is taking now.OR  patient could take Lantus 25 units twice a day without Novolog. Patient would need to be followed by PCP at discharge for management of insulin dosages and blood sugars.   Family had questions about heart healthy meal planning.  Consult for dietician ordered.  Staff RN to give patient and family DM [place] notes on meal planning and will give patient booklet on Living Well with Diabetes. Suggested to family that blood sugars be checked at home at least twice a day.  Will continue to follow while in hospital.   Smith Mince RN BSN CDE

## 2013-08-02 NOTE — H&P (View-Only) (Signed)
Patient's MS is better this PM Alert, oriented  I spoke with neurology  (Dr Reynolds) She has not seen EEG yet.  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  Slowly recovering.  WIth this feel surgery would be very difficult  Patient is not that ambulatory either. Spent 30 min talking to patient and family  I have also reviewed cath from September with M Cooper.  Patient with 2 Cx lesions that could benefit from PTCA/Stent.  OM is small  LAD does not appear severe.  RCA is occluded.  WOuld tentatively plan for PCI tomorrow of L Cx  Hydrate tonight  Check labs in AM  Patient and familly understand benefits, risks.  OK to proceed.    Note family has concerns about her diabetic regimen  It was changed at last admission  With her living situation and their work they cannot do 3x per day insulin.  Will have diabetic coordinator evaluate.   

## 2013-08-02 NOTE — Progress Notes (Signed)
Received consult for diabetes coordinator.  Spoke with nurse about patient.  Patient in procedure at this time.  Family not available.  Will try to follow up later today with family about patient's diabetes care at home.   Smith Mince RN BSN CDE

## 2013-08-02 NOTE — Progress Notes (Signed)
Site area: right groin  Site Prior to Removal:  Level 0  Pressure Applied For 20 MINUTES    Minutes Beginning at 1355  Manual:   yes  Patient Status During Pull:  stable  Post Pull Groin Site:  Level 0  Post Pull Instructions Given:  yes  Post Pull Pulses Present:  yes  Dressing Applied:  yes  Comments:

## 2013-08-02 NOTE — ED Provider Notes (Signed)
Shared service with midlevel provider. I have personally seen and examined the patient, providing direct face to face care, presenting with the chief complaint of dib. Recent admission for NSTEMI and had a cath and intervention within the past few months. EKG unchanged, but she has troponin elevation and ddx is nstemi, and type 2 troponin elevation from sepsis, chf, hypoxia, PE.  Physical exam findings include on the Cardiopulmonary side is pretty normal. Plan will be admit. I have reviewed the nursing documentation on past medical history, family history, and social history.  CRITICAL CARE Performed by: Derwood Kaplan   Total critical care time: 30 minutes  Critical care time was exclusive of separately billable procedures and treating other patients.  Critical care was necessary to treat or prevent imminent or life-threatening deterioration.  Critical care was time spent personally by me on the following activities: development of treatment plan with patient and/or surrogate as well as nursing, discussions with consultants, evaluation of patient's response to treatment, examination of patient, obtaining history from patient or surrogate, ordering and performing treatments and interventions, ordering and review of laboratory studies, ordering and review of radiographic studies, pulse oximetry and re-evaluation of patient's condition.      Derwood Kaplan, MD 08/02/13 [phone]

## 2013-08-02 NOTE — Progress Notes (Signed)
Subjective: Patient says she is tired  No CP  Breathing is fair.   Objective: Filed Vitals:   08/02/13 0500 08/02/13 0600 08/02/13 0700 08/02/13 0738  BP:    136/55  Pulse: 75 74 80 79  Temp:    97.7 F (36.5 C)  TempSrc:    Oral  Resp: 15 20 18 17   Height:      Weight:      SpO2: 98% 98% 99% 99%   Weight change: 1 lb 12.2 oz (0.8 kg)  Intake/Output Summary (Last 24 hours) at 08/02/13 0806 Last data filed at 08/02/13 0800  Gross per 24 hour  Intake 1374.33 ml  Output   1870 ml  Net -495.67 ml    General: Alert, awake, oriented x3, in no acute distress Neck:  JVP is normal Heart: Regular rate and rhythm, without murmurs, rubs, gallops.  Lungs: Clear to auscultation.  No rales or wheezes. Exemities:  No edema.   Neuro: Grossly intact, nonfocal.  Tele:  SR  Lab Results: Results for orders placed during the hospital encounter of 07/30/13 (from the past 24 hour(s))  GLUCOSE, CAPILLARY     Status: Abnormal   Collection Time    08/01/13  8:32 AM      Result Value Range   Glucose-Capillary 140 (*) 70 - 99 mg/dL  GLUCOSE, CAPILLARY     Status: Abnormal   Collection Time    08/01/13 11:21 AM      Result Value Range   Glucose-Capillary 230 (*) 70 - 99 mg/dL  GLUCOSE, CAPILLARY     Status: Abnormal   Collection Time    08/01/13  4:15 PM      Result Value Range   Glucose-Capillary 169 (*) 70 - 99 mg/dL  BASIC METABOLIC PANEL     Status: Abnormal   Collection Time    08/01/13  7:12 PM      Result Value Range   Sodium 137  135 - 145 mEq/L   Potassium 3.8  3.5 - 5.1 mEq/L   Chloride 100  96 - 112 mEq/L   CO2 22  19 - 32 mEq/L   Glucose, Bld 206 (*) 70 - 99 mg/dL   BUN 31 (*) 6 - 23 mg/dL   Creatinine, Ser 1.61 (*) 0.50 - 1.10 mg/dL   Calcium 9.3  8.4 - 09.6 mg/dL   GFR calc non Af Amer 27 (*) >90 mL/min   GFR calc Af Amer 32 (*) >90 mL/min  GLUCOSE, CAPILLARY     Status: Abnormal   Collection Time    08/01/13  9:35 PM      Result Value Range   Glucose-Capillary  231 (*) 70 - 99 mg/dL  CBC     Status: Abnormal   Collection Time    08/02/13  4:42 AM      Result Value Range   WBC 9.0  4.0 - 10.5 K/uL   RBC 4.25  3.87 - 5.11 MIL/uL   Hemoglobin 10.7 (*) 12.0 - 15.0 g/dL   HCT 04.5 (*) 40.9 - 81.1 %   MCV 80.2  78.0 - 100.0 fL   MCH 25.2 (*) 26.0 - 34.0 pg   MCHC 31.4  30.0 - 36.0 g/dL   RDW 91.4 (*) 78.2 - 95.6 %   Platelets 325  150 - 400 K/uL  BASIC METABOLIC PANEL     Status: Abnormal   Collection Time    08/02/13  4:42 AM      Result Value Range  Sodium 137  135 - 145 mEq/L   Potassium 3.9  3.5 - 5.1 mEq/L   Chloride 101  96 - 112 mEq/L   CO2 22  19 - 32 mEq/L   Glucose, Bld 209 (*) 70 - 99 mg/dL   BUN 28 (*) 6 - 23 mg/dL   Creatinine, Ser 2.95 (*) 0.50 - 1.10 mg/dL   Calcium 9.2  8.4 - 28.4 mg/dL   GFR calc non Af Amer 30 (*) >90 mL/min   GFR calc Af Amer 35 (*) >90 mL/min  HEPARIN LEVEL (UNFRACTIONATED)     Status: None   Collection Time    08/02/13  4:42 AM      Result Value Range   Heparin Unfractionated 0.57  0.30 - 0.70 IU/mL    Studies/Results: @RISRSLT24 @  Medications:  Results   @PROBHOSP @  1.  CAD  Plan for PCI today of LCx  2.  Renal  WIll need to follow  Continue hydration  3.  Neuro  MS improved  EEG negative  4.  DM  Follow CS  lantus cut in 1/2 last night.  LOS: 3 days   Dietrich Pates 08/02/2013, 8:06 AM

## 2013-08-02 NOTE — Plan of Care (Signed)
Problem: Food- and Nutrition-Related Knowledge Deficit (NB-1.1) Goal: Nutrition education Formal process to instruct or train a patient/client in a skill or to impart knowledge to help patients/clients voluntarily manage or modify food choices and eating behavior to maintain or improve health. Outcome: Completed/Met Date Met:  08/02/13  RD consulted for nutrition education regarding diabetes.   Pt's daughter and granddaughter with questions. Reviewed both DM and Heart Healthy diets. Focus on label reading. Encouraged follow up with RD at [facility] outpatient. Pt did not participate in the education and seemed disinterested.     Lab Results  Component Value Date    HGBA1C 10.4* 07/23/2013    RD provided "Carbohydrate Counting for People with Diabetes" handout from the Academy of [employer] and Dietetics. Discussed different food groups and their effects on blood sugar, emphasizing carbohydrate-containing foods. Provided list of carbohydrates and recommended serving sizes of common foods.  Discussed importance of controlled and consistent carbohydrate intake throughout the day. Provided examples of ways to balance meals/snacks and encouraged intake of high-fiber, whole grain complex carbohydrates. Teach back method used.  Expect fair compliance.  Body mass index is 30.65 kg/(m^2). Pt meets criteria for Obesity Class I based on current BMI.  Current diet order is CHO Modified Medium, patient is consuming approximately 100% of meals at this time. Labs and medications reviewed. No further nutrition interventions warranted at this time. RD contact information provided. If additional nutrition issues arise, please re-consult RD.  Kendell Bane RD, LDN, CNSC [phone] Pager [phone] After Hours Pager

## 2013-08-02 NOTE — Progress Notes (Signed)
ANTICOAGULATION CONSULT NOTE   Pharmacy Consult for Heparin  Indication: chest pain/ACS  No Known Allergies  Patient Measurements: Height: 5\' 6"  (167.6 cm) Weight: 189 lb 13.1 oz (86.1 kg) IBW/kg (Calculated) : 59.3 Heparin Dosing Weight: ~70 kg  Vital Signs: Temp: 97.7 F (36.5 C) (10/24 0738) Temp src: Oral (10/24 0738) BP: 136/55 mmHg (10/24 0738) Pulse Rate: 79 (10/24 0738)  Labs:  Recent Labs  07/30/13 1818  07/31/13 0243  07/31/13 0245 07/31/13 0840 07/31/13 0925 08/01/13 0615 08/01/13 1912 08/02/13 0442  HGB  --   --   --   --  11.0*  --   --  9.9*  --  10.7*  HCT  --   --   --   --  34.5*  --   --  31.8*  --  34.1*  PLT  --   --   --   --  313  --   --  326  --  325  LABPROT 15.5*  --   --   --   --   --   --   --   --   --   INR 1.26  --   --   --   --   --   --   --   --   --   HEPARINUNFRC  --   --   --   < > 0.57  --  0.52 0.34  --  0.57  CREATININE  --   --   --   --  1.56*  --   --  1.75* 1.70* 1.59*  CKTOTAL  --   --   --   --   --   --   --  84  --   --   TROPONINI  --   < > 0.95*  --   --  0.86*  --  0.59*  --   --   < > = values in this interval not displayed.  Estimated Creatinine Clearance: 31.7 ml/min (by C-G formula based on Cr of 1.59).   Medications:  Heparin at 1000 units/hr  Assessment: 79 y/o F on heparin for CP/ACS. HL remains at goal. Hgb stable. No bleeding noted. Noted tentative plan for PCI today.  Goal of Therapy:  Heparin level 0.3-0.7 units/ml Monitor platelets by anticoagulation protocol: Yes   Plan:  -Continue heparin infusion at 1000 units/hr -Daily CBC/HL -F/u post PCI  Thank you for allowing me to take part in this patient's care,  Christoper Fabian, PharmD, BCPS Clinical pharmacist, pager [phone] 08/02/2013 9:01 AM

## 2013-08-02 NOTE — CV Procedure (Addendum)
    CARDIAC CATH NOTE  Name: Becky Collins MRN: [id number] DOB: October 15, 1931  Procedure: Rotational atherectomy,PTCA and stenting of the left circumflex artery.  Indication: 79 year old white female recently hospitalized with a non-ST elevation myocardial infarction. Diagnostic cardiac catheterization demonstrated occlusion of the right coronary with left to right collaterals. The Left circumflex was a large vessel with 3 sequential high-grade lesions in the proximal, mid, and distal vessel. There was also severe disease in the proximal first obtuse marginal vessel. The patient was treated medically because of multiple medical problems. She now presents with class IV unstable angina despite medical therapy. We recommended percutaneous intervention of the left circumflex artery. This vessel had a 95% stenosis in the proximal vessel, 70% stenosis in the mid vessel, and 90% stenosis in the distal vessel. It was heavily calcified.  Procedural Details: The right groin was prepped, draped, and anesthetized with 1% lidocaine. Using the modified Seldinger technique, a 6 Fr sheath was introduced into the right femoral artery.  Weight-based bivalirudin was given for anticoagulation. Plavix 600 mg was given orally. Once a therapeutic ACT was achieved, a 6 [place] left Voda 3.5 guide catheter was inserted.  A pro-water coronary guidewire was used to cross the lesion. Using a 1.2 mm long balloon we exchanged this wire for a Rotafloppy wire. We then performed rotational atherectomy of the left circumflex using a 1.5 mm burr. The lesions were then predilated with a 2.5 mm compliant balloon.  The lesions in the mid and distal vessel were then stented with a 2.5 x 32 mm Promus stent. The lesion in the proximal vessel was then stented in an overlapping fashion with a 2.5 x 24 mm Promus stent. The stents were postdilated with a 2.75 mm noncompliant balloon.  Following PCI, there was 0% residual stenosis and TIMI-3 flow. Final  angiography confirmed an excellent result. The patient tolerated the procedure well. There were no immediate procedural complications. Femoral hemostasis was achieved with manual compression. The patient was transferred to the post catheterization recovery area for further monitoring. 85 cc of contrast was used.  Lesion Data: Vessel: Left circumflex with sequential lesions in the proximal, mid, and distal vessel. Percent stenosis (pre): 95%, 70%, and 90% sequentially. TIMI-flow (pre):  3 Stent:  2.5 x 32 mm and 2.5 x 24 mm Promus stents Percent stenosis (post): 0% TIMI-flow (post): 3  Conclusions: Successful intracoronary stenting of the left circumflex with use of rotational atherectomy for lesion preparation.  Recommendations: Continue dual antiplatelet therapy for one year. Patient will be carefully hydrated post cath with followup of her renal function in the a.m.  Theron Arista [facility] 08/02/2013, 11:30 AM

## 2013-08-02 NOTE — Progress Notes (Signed)
To the cath lab by bed. 

## 2013-08-02 NOTE — Interval H&P Note (Signed)
History and Physical Interval Note:  08/02/2013 9:48 AM  Becky Collins  has presented today for surgery, with the diagnosis of CAD  The various methods of treatment have been discussed with the patient and family. After consideration of risks, benefits and other options for treatment, the patient has consented to  Procedure(s): PERCUTANEOUS CORONARY STENT INTERVENTION (PCI-S) (N/A) as a surgical intervention .  The patient's history has been reviewed, patient examined, no change in status, stable for surgery.  I have reviewed the patient's chart and labs.  Questions were answered to the patient's satisfaction.   Cath Lab Visit (complete for each Cath Lab visit)  Clinical Evaluation Leading to the Procedure:   ACS: yes  Non-ACS:    Anginal Classification: CCS IV  Anti-ischemic medical therapy: Maximal Therapy (2 or more classes of medications)  Non-Invasive Test Results: No non-invasive testing performed  Prior CABG: No previous CABG        Theron Arista [facility] 08/02/2013 9:48 AM

## 2013-08-03 ENCOUNTER — Inpatient Hospital Stay (HOSPITAL_COMMUNITY): Payer: Medicare Other

## 2013-08-03 DIAGNOSIS — J90 Pleural effusion, not elsewhere classified: Secondary | ICD-10-CM

## 2013-08-03 DIAGNOSIS — I059 Rheumatic mitral valve disease, unspecified: Secondary | ICD-10-CM

## 2013-08-03 DIAGNOSIS — R079 Chest pain, unspecified: Secondary | ICD-10-CM

## 2013-08-03 DIAGNOSIS — I10 Essential (primary) hypertension: Secondary | ICD-10-CM

## 2013-08-03 DIAGNOSIS — E785 Hyperlipidemia, unspecified: Secondary | ICD-10-CM

## 2013-08-03 DIAGNOSIS — Z950 Presence of cardiac pacemaker: Secondary | ICD-10-CM

## 2013-08-03 DIAGNOSIS — I442 Atrioventricular block, complete: Secondary | ICD-10-CM

## 2013-08-03 DIAGNOSIS — R7989 Other specified abnormal findings of blood chemistry: Secondary | ICD-10-CM

## 2013-08-03 LAB — BASIC METABOLIC PANEL
BUN: 21 mg/dL (ref 6–23)
CO2: 21 mEq/L (ref 19–32)
Calcium: 9.1 mg/dL (ref 8.4–10.5)
Creatinine, Ser: 1.36 mg/dL — ABNORMAL HIGH (ref 0.50–1.10)
GFR calc Af Amer: 42 mL/min — ABNORMAL LOW (ref >90)
Glucose, Bld: 177 mg/dL — ABNORMAL HIGH (ref 70–99)
Potassium: 4.1 mEq/L (ref 3.5–5.1)

## 2013-08-03 LAB — CBC
Hemoglobin: 10 g/dL — ABNORMAL LOW (ref 12.0–15.0)
MCH: 24.6 pg — ABNORMAL LOW (ref 26.0–34.0)
MCV: 79.8 fL (ref 78.0–100.0)
RBC: 4.06 MIL/uL (ref 3.87–5.11)

## 2013-08-03 LAB — GLUCOSE, CAPILLARY
Glucose-Capillary: 185 mg/dL — ABNORMAL HIGH (ref 70–99)
Glucose-Capillary: 208 mg/dL — ABNORMAL HIGH (ref 70–99)
Glucose-Capillary: 154 mg/dL — ABNORMAL HIGH (ref 70–99)

## 2013-08-03 MED ORDER — FUROSEMIDE 10 MG/ML IJ SOLN
20.0000 mg | Freq: Once | INTRAMUSCULAR | Status: AC
  Administered 2013-08-03: 20 mg via INTRAVENOUS
  Filled 2013-08-03: qty 2

## 2013-08-03 MED ORDER — ISOSORBIDE MONONITRATE ER 60 MG PO TB24
60.0000 mg | ORAL_TABLET | Freq: Every day | ORAL | Status: DC
  Administered 2013-08-03 – 2013-08-07 (×5): 60 mg via ORAL
  Filled 2013-08-03 (×7): qty 1

## 2013-08-03 MED ORDER — NITROGLYCERIN 0.4 MG SL SUBL
SUBLINGUAL_TABLET | SUBLINGUAL | Status: AC
  Administered 2013-08-03: 06:00:00
  Filled 2013-08-03: qty 25

## 2013-08-03 NOTE — Progress Notes (Addendum)
Patient c/o 6 out of 10 chest discomfort and SOB, EKG done, no obvious changes, RA 98%, BP 117/67, HR 80, applied O2 @ 2L, SL Ntg given.  Will continue to monitor.

## 2013-08-03 NOTE — Progress Notes (Addendum)
CARDIAC REHAB PHASE I   PRE:  Rate/Rhythm: Paced 78  BP:  Supine:  Sitting: 131/70  Standing:    SaO2: 99 2lncc  MODE:  Ambulation: 50 ft with one standing rest break 02 sat 93%   POST:  Rate/Rhythm: 159/73  BP:  Supine:   Sitting: 159/73  Standing:    SaO2: 96 RA Education completed at bedside with pt and son.  Pt contributed little to the interaction however pt would respond when questioned directly.  Instructed on heart healthy lifestyle including diet with diabetic modifications, exercise guidelines with modifications du to pt's prior admission abilities, NTG and to alert 911 for any unrelieved cp.  Son verbalized understanding.  Handouts provided, questions answered. Son agreed for contact information be sent to Outpatient Cardiac Rehab program at [facility].  Pt assisted to ambulate 50 feet in hallway with 1 assist and use of RW.  Pt with visible shortness of breath but denies any chest discomfort.  Pt took one standing rest break o2 sat checked 93% on room air. O2 sat increased with rest.  Pt back to bed, call bell in place, son at bedside.  Will leave off O2 and continue to monitor.  Pt o2 sat 96 on RA while resting in bed.  [phone] Karlene Lineman RN, BSN

## 2013-08-03 NOTE — Progress Notes (Signed)
Still having discomfort 4 out of 10, BP 130/61, 96% on 2L, HR 81. Morphine 2mg  given. Will continue to monitor

## 2013-08-03 NOTE — Progress Notes (Signed)
Pt still c/o 4-5 chest discomfort, BP 125/60, 97% on 2L, HR 80, another SL NTG given.  Will continue to monitor.

## 2013-08-03 NOTE — Progress Notes (Signed)
SUBJECTIVE: [name] underwent intervention of the circumflex on 10/24 by Dr. [place]. She experienced chest pain early this morning, b/w 3-4 am. "It has been coming and going". At present, she says she is short of breath. She denies palpitations. She has been receiving IV fluids post-cath for CKD. She gives a poor history, and it was noted on Dr. Henrietta Collins note from 10/21 that the patient is in fact tangential and a poor historian.     Intake/Output Summary (Last 24 hours) at 08/03/13 0824 Last data filed at 08/03/13 0400  Gross per 24 hour  Intake  868.8 ml  Output   1600 ml  Net -731.2 ml    Current Facility-Administered Medications  Medication Dose Route Frequency Provider Last Rate Last Dose  . acetaminophen (TYLENOL) tablet 650 mg  650 mg Oral Q4H PRN Dayna N Dunn, PA-C   650 mg at 08/02/13 1956  . alum & mag hydroxide-simeth (MAALOX/MYLANTA) 200-200-20 MG/5ML suspension 30 mL  30 mL Oral Q6H PRN Peter M [place], MD   30 mL at 08/02/13 1957  . amLODipine (NORVASC) tablet 5 mg  5 mg Oral Daily Dayna N Dunn, PA-C   5 mg at 08/02/13 0919  . ampicillin (PRINCIPEN) capsule 250 mg  250 mg Oral Q6H Dayna N Dunn, PA-C   250 mg at 08/03/13 0543  . aspirin chewable tablet 81 mg  81 mg Oral QHS Dayna N Dunn, PA-C   81 mg at 08/02/13 1956  . brimonidine (ALPHAGAN) 0.2 % ophthalmic solution 1 drop  1 drop Both Eyes BID Dayna N Dunn, PA-C   1 drop at 08/02/13 2216  . calcium-vitamin D (OSCAL WITH D) 500-200 MG-UNIT per tablet 1 tablet  1 tablet Oral Q breakfast Dayna N Dunn, PA-C   1 tablet at 08/02/13 0920  . cholecalciferol (VITAMIN D) tablet 1,000 Units  1,000 Units Oral Daily Dayna N Dunn, PA-C   1,000 Units at 08/02/13 0920  . clopidogrel (PLAVIX) tablet 75 mg  75 mg Oral Q breakfast Peter M [place], MD      . heparin injection 5,000 Units  5,000 Units Subcutaneous Q8H Peter M [place], MD   5,000 Units at 08/03/13 0542  . insulin aspart (novoLOG) injection 0-9 Units  0-9 Units  Subcutaneous TID WC Dayna N Dunn, PA-C   1 Units at 08/02/13 1812  . isosorbide mononitrate (IMDUR) 24 hr tablet 30 mg  30 mg Oral Daily Dayna N Dunn, PA-C   30 mg at 08/02/13 0920  . metoprolol tartrate (LOPRESSOR) tablet 25 mg  25 mg Oral BID Dayna N Dunn, PA-C   25 mg at 08/02/13 2216  . morphine 2 MG/ML injection 2 mg  2 mg Intravenous Q6H PRN Luis Abed, MD   2 mg at 08/03/13 [phone]  . multivitamin with minerals tablet 1 tablet  1 tablet Oral Daily Dayna N Dunn, PA-C   1 tablet at 08/02/13 0920  . nitroGLYCERIN (NITROSTAT) 0.4 MG SL tablet           . ondansetron (ZOFRAN) injection 4 mg  4 mg Intravenous Q6H PRN Dayna N Dunn, PA-C   4 mg at 08/02/13 1759  . pantoprazole (PROTONIX) EC tablet 40 mg  40 mg Oral Daily Dayna N Dunn, PA-C   40 mg at 08/02/13 0919  . sertraline (ZOLOFT) tablet 100 mg  100 mg Oral q morning - 10a Dayna N Dunn, PA-C   100 mg at 08/02/13 0919  . sodium  chloride 0.9 % injection 3 mL  3 mL Intravenous Q12H Dayna N Dunn, PA-C   3 mL at 08/01/13 1224  . sodium chloride 0.9 % injection 3 mL  3 mL Intravenous PRN Dayna N Dunn, PA-C        Filed Vitals:   08/02/13 2344 08/03/13 0230 08/03/13 0400 08/03/13 0744  BP: 108/61  124/59 126/56  Pulse: 81   81  Temp: 98.5 F (36.9 C)  97.6 F (36.4 C) 97.7 F (36.5 C)  TempSrc: Oral  Oral Oral  Resp: 20   18  Height:      Weight:  194 lb 3.6 oz (88.1 kg)    SpO2: 95%   100%    PHYSICAL EXAM General: NAD Neck: No JVD, no thyromegaly or thyroid nodule.  Lungs: Diminished at bases bilaterally with dry crackles, with normal respiratory effort. CV: Nondisplaced PMI.  Heart regular S1/S2, no S3/S4, no murmur.  No peripheral edema.  No carotid bruit.  Normal pedal pulses.  Abdomen: Soft, nontender, no hepatosplenomegaly, no distention.  Neurologic: Alert and oriented x 3.  Psych: Normal affect. Extremities: No clubbing or cyanosis.   TELEMETRY: Reviewed telemetry pt in a paced rhythm.  LABS: Basic Metabolic  Panel:  Recent Labs  08/02/13 0442 08/03/13 0235  NA 137 137  K 3.9 4.1  CL 101 105  CO2 22 21  GLUCOSE 209* 177*  BUN 28* 21  CREATININE 1.59* 1.36*  CALCIUM 9.2 9.1   Liver Function Tests: No results found for this basename: AST, ALT, ALKPHOS, BILITOT, PROT, ALBUMIN,  in the last 72 hours No results found for this basename: LIPASE, AMYLASE,  in the last 72 hours CBC:  Recent Labs  08/02/13 0442 08/03/13 0235  WBC 9.0 7.8  HGB 10.7* 10.0*  HCT 34.1* 32.4*  MCV 80.2 79.8  PLT 325 298   Cardiac Enzymes:  Recent Labs  08/01/13 0615 08/02/13 2109 08/03/13 0235  CKTOTAL 84  --   --   TROPONINI 0.59* 1.22* 2.90*   BNP: No components found with this basename: POCBNP,  D-Dimer: No results found for this basename: DDIMER,  in the last 72 hours Hemoglobin A1C: No results found for this basename: HGBA1C,  in the last 72 hours Fasting Lipid Panel: No results found for this basename: CHOL, HDL, LDLCALC, TRIG, CHOLHDL, LDLDIRECT,  in the last 72 hours Thyroid Function Tests: No results found for this basename: TSH, T4TOTAL, FREET3, T3FREE, THYROIDAB,  in the last 72 hours Anemia Panel:  Recent Labs  07/31/13 1900  FOLATE >20.0    RADIOLOGY: Dg Chest 2 View  07/30/2013   CLINICAL DATA:  Shortness of breath and chest pain.  EXAM: CHEST  2 VIEW  COMPARISON:  Multiple priors  FINDINGS: Cardiac pacing device is unchanged. The cardiomediastinal silhouette is unchanged. No frank edema. Bibasilar opacities are identified, increased on the left. Similar left pleural effusion. Decreased right pleural effusion No pleural effusion or pneumothorax. No acute osseous abnormality.  IMPRESSION: 1. Bibasilar opacities, increased on the left. Findings represent atelectasis versus infiltrate.  2. Bilateral pleural effusions, similar on the left and decreased on the right.   Electronically Signed   By: Jerene Dilling M.D.   On: 07/30/2013 16:32   Dg Chest 2 View  07/26/2013    CLINICAL DATA:  Decreased breath sounds at the right base.  EXAM: CHEST  2 VIEW  COMPARISON:  Multiple priors  FINDINGS: Cardiac pacing device is unchanged. The cardiomediastinal silhouette is unchanged. No frank  edema. Bibasilar opacities. Low lung volumes. No pneumothorax. Bilateral small pleural effusions.  IMPRESSION: Bibasilar opacities, likely representing atelectasis. Developing infection is not excluded. Small bilateral pleural effusions.   Electronically Signed   By: Jerene Dilling M.D.   On: 07/26/2013 09:47   Ct Head Wo Contrast  07/31/2013   CLINICAL DATA:  Stroke.  EXAM: CT HEAD WITHOUT CONTRAST  TECHNIQUE: Contiguous axial images were obtained from the base of the skull through the vertex without intravenous contrast.  COMPARISON:  CT 07/22/2013  FINDINGS: Generalized atrophy. Negative for acute infarct. Negative for hemorrhage or mass.  Mucosal edema in the sphenoid sinus. No acute bony abnormality.  IMPRESSION: No acute intracranial abnormality.   Electronically Signed   By: Marlan Palau M.D.   On: 07/31/2013 21:49   Ct Head Wo Contrast  07/22/2013   CLINICAL DATA:  History of hypertension, diabetes, and heart disease. Confusion.  EXAM: CT HEAD WITHOUT CONTRAST  TECHNIQUE: Contiguous axial images were obtained from the base of the skull through the vertex without contrast.  COMPARISON:  07/20/2013.  FINDINGS: Stable atrophy and chronic microvascular ischemic change. No evidence for acute infarction, hemorrhage, mass lesion, hydrocephalus, or extra-axial fluid. Calvarium intact. Advanced vascular calcification. Minimal sinus fluid is stable. No mastoid inflammatory process.  IMPRESSION: Stable atrophy and chronic microvascular ischemic change. No acute intracranial findings.   Electronically Signed   By: Davonna Belling M.D.   On: 07/22/2013 18:27   Ct Head Wo Contrast  07/20/2013   *RADIOLOGY REPORT*  Clinical Data: Confusion  CT HEAD WITHOUT CONTRAST  Technique:  Contiguous axial  images were obtained from the base of the skull through the vertex without contrast.  Comparison: Prior MRI from 08/16/2011  Findings: Mild prominence of the CSF containing spaces was compatible with generalized atrophy. Scattered and confluent hypodensities within the periventricular deep white matter most compatible with chronic microvascular ischemic disease.  Remote right parietal infarct is noted, unchanged as compared to prior MRI.  No acute intracranial hemorrhage is identified.  There is no large vessel territory infarct.  No extra-axial fluid collection. No midline shift or mass lesion.  Calvarium is intact.  Orbital soft tissues are within normal limits.  Paranasal sinuses and mastoid air cells are clear.  IMPRESSION: 1. No acute intracranial process.  2. Unchanged remote right parietal infarct.  3. Mild age related atrophy and chronic microvascular ischemic disease, similar to prior.   Original Report Authenticated By: Rise Mu, M.D.      ASSESSMENT AND PLAN: 1. CAD s/p PCI of LCx: continue dual antiplatelet therapy for one year. Troponin mildly elevated but this is not unusual after complex PCI. I do not recommend heparin, nor do I recommend non-invasive imaging. Recent echo showed EF 50-55% with moderate MR and wall motion abnormalities. Given her chest discomfort, I will increase Imdur to 60 mg daily. 2. Shortness of breath: her BP has been stable (thus doubt diastolic decompensation), and her lung sounds are diminished at the bases with crackles which may represent the atelectasis seen on prior chest films. I will repeat a portable chest xray today to ensure that her pleural effusions and atelectasis vs infiltrate have not progressed, and to make certain she has no intravascular congestion, given that she has received IV fluids for CKD as well. Would not give Lasix until xray is reviewed. 3. CKD: GFR now 42 ml/min from 35 ml/min on 10/24 after IV fluids. As stated previously, would  not give Lasix for her SOB until chest film  is reviewed. 4. HTN: controlled on current therapy. 5. Diabetes mellitus: on insulin. 6. Anemia: Hgb 10 today from 10.7. Continue to monitor. 7. CHB s/p pacemaker  Dispo: will hold off on discharge today until aforementioned issues are addressed and reconciled.  Prentice Docker, M.D., F.A.C.C.

## 2013-08-04 DIAGNOSIS — R0789 Other chest pain: Secondary | ICD-10-CM

## 2013-08-04 DIAGNOSIS — I5033 Acute on chronic diastolic (congestive) heart failure: Secondary | ICD-10-CM

## 2013-08-04 DIAGNOSIS — E1129 Type 2 diabetes mellitus with other diabetic kidney complication: Secondary | ICD-10-CM

## 2013-08-04 DIAGNOSIS — I509 Heart failure, unspecified: Secondary | ICD-10-CM

## 2013-08-04 DIAGNOSIS — Z9861 Coronary angioplasty status: Secondary | ICD-10-CM

## 2013-08-04 DIAGNOSIS — R1310 Dysphagia, unspecified: Secondary | ICD-10-CM

## 2013-08-04 LAB — BASIC METABOLIC PANEL
BUN: 19 mg/dL (ref 6–23)
BUN: 19 mg/dL (ref 6–23)
CO2: 24 mEq/L (ref 19–32)
Calcium: 9.4 mg/dL (ref 8.4–10.5)
Calcium: 9.2 mg/dL (ref 8.4–10.5)
Creatinine, Ser: 1.46 mg/dL — ABNORMAL HIGH (ref 0.50–1.10)
GFR calc Af Amer: 38 mL/min — ABNORMAL LOW (ref >90)
GFR calc non Af Amer: 33 mL/min — ABNORMAL LOW (ref >90)
Glucose, Bld: 178 mg/dL — ABNORMAL HIGH (ref 70–99)
Glucose, Bld: 235 mg/dL — ABNORMAL HIGH (ref 70–99)
Potassium: 4.3 mEq/L (ref 3.5–5.1)
Sodium: 136 mEq/L (ref 135–145)
Sodium: 135 mEq/L (ref 135–145)

## 2013-08-04 LAB — GLUCOSE, CAPILLARY
Glucose-Capillary: 207 mg/dL — ABNORMAL HIGH (ref 70–99)
Glucose-Capillary: 203 mg/dL — ABNORMAL HIGH (ref 70–99)

## 2013-08-04 MED ORDER — POTASSIUM CHLORIDE CRYS ER 20 MEQ PO TBCR: 20.0000 meq | EXTENDED_RELEASE_TABLET | Freq: Once | ORAL | Status: DC

## 2013-08-04 MED ORDER — INSULIN ASPART 100 UNIT/ML ~~LOC~~ SOLN
0.0000 [IU] | Freq: Three times a day (TID) | SUBCUTANEOUS | Status: DC
  Administered 2013-08-04: 5 [IU] via SUBCUTANEOUS
  Administered 2013-08-05: 3 [IU] via SUBCUTANEOUS
  Administered 2013-08-05: 8 [IU] via SUBCUTANEOUS
  Administered 2013-08-05 – 2013-08-06 (×3): 5 [IU] via SUBCUTANEOUS
  Administered 2013-08-07: 2 [IU] via SUBCUTANEOUS
  Administered 2013-08-07: 3 [IU] via SUBCUTANEOUS
  Administered 2013-08-07: 2 [IU] via SUBCUTANEOUS
  Administered 2013-08-08 (×2): 3 [IU] via SUBCUTANEOUS
  Administered 2013-08-08: 2 [IU] via SUBCUTANEOUS
  Administered 2013-08-09: 8 [IU] via SUBCUTANEOUS

## 2013-08-04 MED ORDER — FUROSEMIDE 10 MG/ML IJ SOLN
80.0000 mg | Freq: Two times a day (BID) | INTRAMUSCULAR | Status: AC
  Administered 2013-08-04 (×2): 80 mg via INTRAVENOUS
  Filled 2013-08-04 (×2): qty 8

## 2013-08-04 MED ORDER — POTASSIUM CHLORIDE CRYS ER 20 MEQ PO TBCR
20.0000 meq | EXTENDED_RELEASE_TABLET | Freq: Two times a day (BID) | ORAL | Status: AC
  Administered 2013-08-04 (×2): 20 meq via ORAL
  Filled 2013-08-04 (×2): qty 1

## 2013-08-04 MED ORDER — ATORVASTATIN CALCIUM 80 MG PO TABS
80.0000 mg | ORAL_TABLET | Freq: Every day | ORAL | Status: DC
  Administered 2013-08-04 – 2013-08-08 (×5): 80 mg via ORAL
  Filled 2013-08-04 (×6): qty 1

## 2013-08-04 MED ORDER — GI COCKTAIL ~~LOC~~
30.0000 mL | Freq: Three times a day (TID) | ORAL | Status: DC | PRN
  Administered 2013-08-05 – 2013-08-08 (×5): 30 mL via ORAL
  Filled 2013-08-04 (×5): qty 30

## 2013-08-04 MED ORDER — PANTOPRAZOLE SODIUM 40 MG PO TBEC
40.0000 mg | DELAYED_RELEASE_TABLET | Freq: Two times a day (BID) | ORAL | Status: DC
  Administered 2013-08-04 – 2013-08-09 (×10): 40 mg via ORAL
  Filled 2013-08-04 (×10): qty 1

## 2013-08-04 MED ORDER — FUROSEMIDE 10 MG/ML IJ SOLN: 60.0000 mg | Freq: Once | INTRAMUSCULAR | Status: DC

## 2013-08-04 NOTE — Progress Notes (Addendum)
I have interviewed and examined the patient the patient. The clinical data including this note and recommendations are reviewed and affirmed. I believe more aggressive diuresis is needed to managed acute on chronic DHF. Ongoing chest pain is difficult to interpret after reviewing records which seem to indicate continuous pain for weeks.Will trend Troponin and check ECG.

## 2013-08-04 NOTE — Progress Notes (Signed)
At 2020 pt c/o chest pain to center of chest describing it as sharp and of a headache to the back of her head. VSS. Pt given 2mg  of IV morphine. Also noted that pt CXR from earlier in day with some worsening. MD on call made aware and orders received. Also informed him of the chest pain and steps taken. Will medicate with lasix and cont to monitor.

## 2013-08-04 NOTE — Progress Notes (Signed)
Patient Name: Becky Collins Date of Encounter: 08/04/2013     Principal Problem:   NSTEMI (non-ST elevated myocardial infarction) Active Problems:   CAD (coronary artery disease)   Acute on chronic diastolic CHF (congestive heart failure)   Atherosclerosis of native arteries of the extremities with intermittent claudication   Mitral regurgitation   CKD (chronic kidney disease), stage III   HTN (hypertension)   HLD (hyperlipidemia)   Carotid artery disease   Ejection fraction   History of seizure disorder   UTI (urinary tract infection)   Pacemaker   Abnormal mini-mental status exam   Chest wall pain   Dysphagia   Type 2 diabetes mellitus with renal manifestations   SUBJECTIVE: Still short of breath. R chest pain on inspiration, palpation. Difficulty swallowing, painful swallowing.    OBJECTIVE  Filed Vitals:   08/03/13 1638 08/03/13 2021 08/04/13 0445 08/04/13 0611  BP: 118/67 120/68 135/53   Pulse: 78 80 81   Temp: 98.5 F (36.9 C) 98.6 F (37 C) 98.2 F (36.8 C)   TempSrc: Oral Oral Oral   Resp: 18 20 20    Height:      Weight:    85.1 kg (187 lb 9.8 oz)  SpO2: 96% 99% 99%     Intake/Output Summary (Last 24 hours) at 08/04/13 0820 Last data filed at 08/04/13 0100  Gross per 24 hour  Intake    240 ml  Output    400 ml  Net   -160 ml   Weight change: -3 kg (-6 lb 9.8 oz)  PHYSICAL EXAM  General: Elderly, fatigued appearing female in no acute distress. Head: Normocephalic, atraumatic, sclera non-icteric, no xanthomas, nares are without discharge.  Neck: Supple without bruits or JVD. Lungs:  Tachypneic with mildly labored respirations, bibasilar rales appreciates, no wheezes or rhonchi Heart: RRR no s3, s4, or murmurs. Abdomen: Soft, non-tender, non-distended, BS + x 4.  Msk:  R sided chest wall tenderness to palpation, strength and tone appears normal for age. Extremities: R groin site non-tender, non-indurated, no bruits, bleeding or discharge. No  clubbing, cyanosis or edema. DP/PT/Radials 2+ and equal bilaterally. Neuro: Alert and oriented X 3. Moves all extremities spontaneously. Psych: Normal affect.  LABS:  Recent Labs     08/02/13  0442  08/03/13  0235  WBC  9.0  7.8  HGB  10.7*  10.0*  HCT  34.1*  32.4*  MCV  80.2  79.8  PLT  325  298   Recent Labs Lab 07/30/13 1557 07/30/13 2200  08/02/13 0442 08/03/13 0235 08/04/13 0444  NA 137  --   < > 137 137 136  K 4.4  --   < > 3.9 4.1 4.1  CL 103  --   < > 101 105 101  CO2 21  --   < > 22 21 20   BUN 29*  --   < > 28* 21 19  CREATININE 1.44*  --   < > 1.59* 1.36* 1.46*  CALCIUM 10.3  --   < > 9.2 9.1 9.4  PROT 7.3  --   --   --   --   --   BILITOT 0.4  --   --   --   --   --   ALKPHOS 59  --   --   --   --   --   ALT 21  --   --   --   --   --   AST  32  --   --   --   --   --   LIPASE  --  19  --   --   --   --   GLUCOSE 119*  --   < > 209* 177* 178*  < > = values in this interval not displayed.  Recent Labs     08/02/13  2109  08/03/13  0235  08/03/13  0815  TROPONINI  1.22*  2.90*  3.55*   TELE: A-sensed, V-paced rhythm, 70 bpm   Radiology/Studies:  Dg Chest 2 View  08/03/2013   CLINICAL DATA:  Status post cardiac surgery, chest pain  EXAM: CHEST  2 VIEW  COMPARISON:  07/30/2013  FINDINGS: Stable mild cardiomegaly. Two lead cardiac pacer again identified. Mild to moderate vascular congestion and mild interstitial pulmonary edema. Small bilateral pleural effusions.  IMPRESSION: Evidence of congestive heart failure with mild pulmonary edema slightly worse when compared to the prior study.   Electronically Signed   By: Esperanza Heir M.D.   On: 08/03/2013 12:50   Dg Chest 2 View  07/30/2013   CLINICAL DATA:  Shortness of breath and chest pain.  EXAM: CHEST  2 VIEW  COMPARISON:  Multiple priors  FINDINGS: Cardiac pacing device is unchanged. The cardiomediastinal silhouette is unchanged. No frank edema. Bibasilar opacities are identified, increased on the  left. Similar left pleural effusion. Decreased right pleural effusion No pleural effusion or pneumothorax. No acute osseous abnormality.  IMPRESSION: 1. Bibasilar opacities, increased on the left. Findings represent atelectasis versus infiltrate.  2. Bilateral pleural effusions, similar on the left and decreased on the right.   Electronically Signed   By: Jerene Dilling M.D.   On: 07/30/2013 16:32   Ct Head Wo Contrast  07/31/2013   CLINICAL DATA:  Stroke.  EXAM: CT HEAD WITHOUT CONTRAST  TECHNIQUE: Contiguous axial images were obtained from the base of the skull through the vertex without intravenous contrast.  COMPARISON:  CT 07/22/2013  FINDINGS: Generalized atrophy. Negative for acute infarct. Negative for hemorrhage or mass.  Mucosal edema in the sphenoid sinus. No acute bony abnormality.  IMPRESSION: No acute intracranial abnormality.   Electronically Signed   By: Marlan Palau M.D.   On: 07/31/2013 21:49   Current Medications:  . amLODipine  5 mg Oral Daily  . ampicillin  250 mg Oral Q6H  . aspirin  81 mg Oral QHS  . brimonidine  1 drop Both Eyes BID  . calcium-vitamin D  1 tablet Oral Q breakfast  . cholecalciferol  1,000 Units Oral Daily  . clopidogrel  75 mg Oral Q breakfast  . heparin  5,000 Units Subcutaneous Q8H  . insulin aspart  0-9 Units Subcutaneous TID WC  . isosorbide mononitrate  60 mg Oral Daily  . metoprolol tartrate  25 mg Oral BID  . multivitamin with minerals  1 tablet Oral Daily  . pantoprazole  40 mg Oral Daily  . sertraline  100 mg Oral q morning - 10a  . sodium chloride  3 mL Intravenous Q12H    ASSESSMENT AND PLAN:  79yo African American female with PMHx s/f 3V CAD (s/p DES x 2- LCx 08/02/13), HFpEF, CHB s/p Medtronic dual-chamber PPM, moderate MR (per 05/2013 echo), h/o seizure disorder, CKD (stage III), orthostatic hypotension, h/o medication noncompliance, uncontrolled DM2 with renal complications, HTN, HLD and chronic normocytic anemia admitted 10/22  for NSTEMI.   1. CAD/NSTEMI s/p DES x 2-LCx on 10/24 Atypical chest pain this AM. Suspected  dyspnea secondary to volume overload. Working with cardiac rehab w/o incident. Received education yesterday. Groin site healing well w/o evidence of complication. -- Continue DAPT- ASA/Plavix- BB, Imdur, NTG SL PRN -- Needs high-intensity statin per new guidelines (see #11) -- Consider replacing adding ACEi/ARB if renal function returns to baseline (Cr 1.2-1.4) for renal protection with underlying DM2  2. Acute on chronic diastolic CHF Tachypneic with bibasilar rales on exam. No JVD or peripheral edema. Not hypoxic at rest or with ambulation on RA. CXR last night did indicate pulmonary edema/CHF. Received pre-cath fluids for renal protection. Minimal output with Lasix 20mg  IV yesterday. With reduced GFR, suspect she needs higher dose to achieve diuretic effect (good UOP with Lasix 80 IV 10/23). BP well-controlled. EF 50-55% on echo earlier this month.  -- Give dose of Lasix 60 IV, monitor UOP and breathing with this. Avoid over-diuresis with baseline CKD.   3. Odynophagia with dysphagia Endorsed on interview this AM. She has dysphagia at home as well. Notes sharp pain when swallowing small bits of solid food.  -- Increase Protonix to BID frequency, resume on discharge (avoid Prilosec w/ Plavix)  -- GI cocktail PRN -- Consider swallow study/GI eval   4. Atypical R-sided chest wall pain Tenderness to palpation and on inspiration. Suspect musculoskeletal/neuropathic inflammatory etiologies. This discomfort has been noted on prior notes/admissions. -- Supportive care  5. Altered mental status, improving Suspected to be secondary to UTI with baseline of mild dementia. Improving daily. Seen by neuro earlier this admission. Noncontrast head CT w/o acute abnormalities, EEG normal. RPR NR. TSH WNL last admission.  6. Weakness, deconditioning Ambulating well with cardiac rehab. Several HH services planned, but  would obtain formal PT/OT consults as I suspect she would benefit from SNF. Chronic uncontrolled comorbidities, unable to have 24 hr supervision at home.   7. Uncontrolled DM2 with renal complications Hgb A1C 10% in 05/2013. Diabetes coordinator recommendations appreciated. Switch to Novolog 70/30- 40 units BID at breakfast and supper or Lantus 25 units BID w/o Novolog to facilitate adherence.   8. CHB s/p Medtronic dual-chamber PPM Appropriate A-sensing, V-pacing on telemetry.   9. CKD, stage III Secondary uncontrolled DM2, ? Longstanding HTN. Baseline Cr 1.2-1.4. BMET indicated Cr 1.46 yesterday, up from 1.36 the day prior.  -- Obtain BMET this AM and tomorrow AM to follow electrolytes and renal function with ongoing diuresis -- Avoid nephrotoxic meds, could consider ACEi/ARB for renal protection if BP tolerates. May have progressed past microalbuminuria at this point however.   10. Hypertension Well-controlled.  -- Continue current antihypertensives  11. Hyperlipidemia Discharged on simvastatin last admission. Stopped shortly after admission due to concerns of myopathy attributing to her atypical chest wall pain. CK normal 10/23. LFTs WNL 10/21. -- Start atorvastatin 80mg  PO qhs -- Check lipid panel tomorrow AM -- LFTs and repeat lipid panel in 6 weeks  12. UTI On ampicillin current based on sensitivity data. -- Would finish 14 day course with underlying DM2  13. Normocytic anemia Stable, suspect AOCD. Keep Hgb > 10 with underlying 3V CAD.   14. MR, moderate -- Follow.  Dispo PT/OT consult today to determine if SNF placement appropriate. Needs further diuresis. Tentative discharge 1-2 days.   Signed, R. Hurman Horn, PA-C 08/04/2013, 8:20 AM

## 2013-08-05 DIAGNOSIS — E1129 Type 2 diabetes mellitus with other diabetic kidney complication: Secondary | ICD-10-CM

## 2013-08-05 DIAGNOSIS — N390 Urinary tract infection, site not specified: Secondary | ICD-10-CM

## 2013-08-05 DIAGNOSIS — N058 Unspecified nephritic syndrome with other morphologic changes: Secondary | ICD-10-CM

## 2013-08-05 LAB — LIPID PANEL
Cholesterol: 137 mg/dL (ref 0–200)
HDL: 28 mg/dL — ABNORMAL LOW (ref >39)
LDL Cholesterol: 82 mg/dL (ref 0–99)
Triglycerides: 135 mg/dL (ref <150)
VLDL: 27 mg/dL (ref 0–40)

## 2013-08-05 LAB — GLUCOSE, CAPILLARY
Glucose-Capillary: 175 mg/dL — ABNORMAL HIGH (ref 70–99)
Glucose-Capillary: 227 mg/dL — ABNORMAL HIGH (ref 70–99)
Glucose-Capillary: 158 mg/dL — ABNORMAL HIGH (ref 70–99)
Glucose-Capillary: 100 mg/dL — ABNORMAL HIGH (ref 70–99)

## 2013-08-05 LAB — BASIC METABOLIC PANEL
CO2: 25 mEq/L (ref 19–32)
Calcium: 9.8 mg/dL (ref 8.4–10.5)
Chloride: 100 mEq/L (ref 96–112)
Creatinine, Ser: 1.77 mg/dL — ABNORMAL HIGH (ref 0.50–1.10)
GFR calc non Af Amer: 26 mL/min — ABNORMAL LOW (ref >90)
Sodium: 137 mEq/L (ref 135–145)

## 2013-08-05 MED ORDER — INSULIN ASPART PROT & ASPART (70-30 MIX) 100 UNIT/ML ~~LOC~~ SUSP
20.0000 [IU] | Freq: Two times a day (BID) | SUBCUTANEOUS | Status: DC
  Administered 2013-08-05 – 2013-08-06 (×3): 20 [IU] via SUBCUTANEOUS
  Filled 2013-08-05 (×2): qty 10

## 2013-08-05 MED ORDER — NITROGLYCERIN 0.4 MG SL SUBL
SUBLINGUAL_TABLET | SUBLINGUAL | Status: AC
  Filled 2013-08-05: qty 25

## 2013-08-05 MED ORDER — FUROSEMIDE 10 MG/ML IJ SOLN
40.0000 mg | Freq: Two times a day (BID) | INTRAMUSCULAR | Status: DC
  Administered 2013-08-05 – 2013-08-06 (×3): 40 mg via INTRAVENOUS
  Filled 2013-08-05 (×5): qty 4

## 2013-08-05 MED FILL — Sodium Chloride IV Soln 0.9%: INTRAVENOUS | Qty: 50 | Status: AC

## 2013-08-05 NOTE — Progress Notes (Signed)
TELEMETRY: Reviewed telemetry pt in paced rhythm: Filed Vitals:   08/04/13 1421 08/04/13 1525 08/04/13 2055 08/05/13 0520  BP: 128/55 117/56 119/58 127/70  Pulse: 75 77 88 80  Temp: 98.1 F (36.7 C) 98.3 F (36.8 C) 98.6 F (37 C) 98.5 F (36.9 C)  TempSrc: Oral Oral Oral Oral  Resp: 17 18 16 16   Height:      Weight:      SpO2:  100% 95% 100%   No intake or output data in the 24 hours ending 08/05/13 0740  SUBJECTIVE Complains of being SOB. Complains of right anterior chest pain.  LABS: Basic Metabolic Panel:  Recent Labs  40/98/11 1153 08/05/13 0042  NA 135 137  K 4.3 4.7  CL 101 100  CO2 24 25  GLUCOSE 235* 230*  BUN 19 23  CREATININE 1.52* 1.77*  CALCIUM 9.2 9.8   CBC:  Recent Labs  08/03/13 0235  WBC 7.8  HGB 10.0*  HCT 32.4*  MCV 79.8  PLT 298   Cardiac Enzymes:  Recent Labs  08/02/13 2109 08/03/13 0235 08/03/13 0815  TROPONINI 1.22* 2.90* 3.55*   Fasting Lipid Panel:  Recent Labs  08/05/13 0042  CHOL 137  HDL 28*  LDLCALC 82  TRIG 914  CHOLHDL 4.9    Radiology/Studies:  Dg Chest 2 View  08/03/2013   CLINICAL DATA:  Status post cardiac surgery, chest pain  EXAM: CHEST  2 VIEW  COMPARISON:  07/30/2013  FINDINGS: Stable mild cardiomegaly. Two lead cardiac pacer again identified. Mild to moderate vascular congestion and mild interstitial pulmonary edema. Small bilateral pleural effusions.  IMPRESSION: Evidence of congestive heart failure with mild pulmonary edema slightly worse when compared to the prior study.   Electronically Signed   By: Esperanza Heir M.D.   On: 08/03/2013 12:50   Dg Chest 2 View  07/30/2013   CLINICAL DATA:  Shortness of breath and chest pain.  EXAM: CHEST  2 VIEW  COMPARISON:  Multiple priors  FINDINGS: Cardiac pacing device is unchanged. The cardiomediastinal silhouette is unchanged. No frank edema. Bibasilar opacities are identified, increased on the left. Similar left pleural effusion. Decreased right pleural  effusion No pleural effusion or pneumothorax. No acute osseous abnormality.  IMPRESSION: 1. Bibasilar opacities, increased on the left. Findings represent atelectasis versus infiltrate.  2. Bilateral pleural effusions, similar on the left and decreased on the right.   Electronically Signed   By: Jerene Dilling M.D.   On: 07/30/2013 16:32   Dg Chest 2 View  07/26/2013   CLINICAL DATA:  Decreased breath sounds at the right base.  EXAM: CHEST  2 VIEW  COMPARISON:  Multiple priors  FINDINGS: Cardiac pacing device is unchanged. The cardiomediastinal silhouette is unchanged. No frank edema. Bibasilar opacities. Low lung volumes. No pneumothorax. Bilateral small pleural effusions.  IMPRESSION: Bibasilar opacities, likely representing atelectasis. Developing infection is not excluded. Small bilateral pleural effusions.   Electronically Signed   By: Jerene Dilling M.D.   On: 07/26/2013 09:47   Ct Head Wo Contrast  07/31/2013   CLINICAL DATA:  Stroke.  EXAM: CT HEAD WITHOUT CONTRAST  TECHNIQUE: Contiguous axial images were obtained from the base of the skull through the vertex without intravenous contrast.  COMPARISON:  CT 07/22/2013  FINDINGS: Generalized atrophy. Negative for acute infarct. Negative for hemorrhage or mass.  Mucosal edema in the sphenoid sinus. No acute bony abnormality.  IMPRESSION: No acute intracranial abnormality.   Electronically Signed   By: Marlan Palau  M.D.   On: 07/31/2013 21:49   Ct Head Wo Contrast  07/22/2013   CLINICAL DATA:  History of hypertension, diabetes, and heart disease. Confusion.  EXAM: CT HEAD WITHOUT CONTRAST  TECHNIQUE: Contiguous axial images were obtained from the base of the skull through the vertex without contrast.  COMPARISON:  07/20/2013.  FINDINGS: Stable atrophy and chronic microvascular ischemic change. No evidence for acute infarction, hemorrhage, mass lesion, hydrocephalus, or extra-axial fluid. Calvarium intact. Advanced vascular calcification.  Minimal sinus fluid is stable. No mastoid inflammatory process.  IMPRESSION: Stable atrophy and chronic microvascular ischemic change. No acute intracranial findings.   Electronically Signed   By: Davonna Belling M.D.   On: 07/22/2013 18:27   Ct Head Wo Contrast  07/20/2013   *RADIOLOGY REPORT*  Clinical Data: Confusion  CT HEAD WITHOUT CONTRAST  Technique:  Contiguous axial images were obtained from the base of the skull through the vertex without contrast.  Comparison: Prior MRI from 08/16/2011  Findings: Mild prominence of the CSF containing spaces was compatible with generalized atrophy. Scattered and confluent hypodensities within the periventricular deep white matter most compatible with chronic microvascular ischemic disease.  Remote right parietal infarct is noted, unchanged as compared to prior MRI.  No acute intracranial hemorrhage is identified.  There is no large vessel territory infarct.  No extra-axial fluid collection. No midline shift or mass lesion.  Calvarium is intact.  Orbital soft tissues are within normal limits.  Paranasal sinuses and mastoid air cells are clear.  IMPRESSION: 1. No acute intracranial process.  2. Unchanged remote right parietal infarct.  3. Mild age related atrophy and chronic microvascular ischemic disease, similar to prior.   Original Report Authenticated By: Rise Mu, M.D.   Ecg: Paced rhythm.   PHYSICAL EXAM General: Well developed, appears dyspneic Head: Normal Neck: Negative for carotid bruits. JVD not elevated. Lungs: Bibasilar rales. Heart: RRR S1 S2 without murmurs, rubs, or gallops.  Abdomen: Soft, non-tender, non-distended with normoactive bowel sounds. No hepatomegaly. No rebound/guarding. No obvious abdominal masses. Msk:  Strength and tone appears normal for age. Extremities: No clubbing, cyanosis or edema.  Distal pedal pulses are 2+ and equal bilaterally. Neuro: Alert and oriented X 3. Moves all extremities spontaneously. Psych:   Responds to questions appropriately with a normal affect.  ASSESSMENT AND PLAN: 1. NSTEMI/CAD s/p complex PCI of the LCX with rotational atherectomy and DES stents x 2. Continues to have atypical chest pain that appears to be musculoskeletal. Continue DAPT with ASA and Plavix. On metoprolol, amlodipine, and nitrates.  2. Acute on chronic diastolic CHF. Diuresed yesterday with IV lasix. Weight down 7 lbs. I/O incomplete. Still appears volume overloaded. Will need further diuresis despite elevated creatinine. Will give lasix 40 mg IV bid today. Repeat BMET in am. Not a candidate for ACEi/ ARB 3. Musculoskeletal chest pain. 4. Altered mental status.  5. UTI on ampicillin 6. DM with poor control. Basilar insulin not ordered. Only on SSI. Diabetes coordinator note appreciated on Fri. Will start Novolin 70/30 20 units bid and adjust as needed. 7. Acute on chronic kidney disease stage 3. Creatinine rising due to contrast study and diuresis. Still volume overloaded. Will continue diuresis today. 8. HTN controlled. 9. Deconditioning. PT/OT consults in progress. May need short term SNF.  Principal Problem:   NSTEMI (non-ST elevated myocardial infarction) Active Problems:   HTN (hypertension)   HLD (hyperlipidemia)   Carotid artery disease   Atherosclerosis of native arteries of the extremities with intermittent claudication   Ejection  fraction   Mitral regurgitation   History of seizure disorder   CKD (chronic kidney disease), stage III   UTI (urinary tract infection)   CAD (coronary artery disease)   Pacemaker   Abnormal mini-mental status exam   Chest wall pain   Dysphagia   Acute on chronic diastolic CHF (congestive heart failure)   Type 2 diabetes mellitus with renal manifestations   Odynophagia    Signed, Tevita Gomer [place] MD,FACC 08/05/2013 7:55 AM

## 2013-08-05 NOTE — Progress Notes (Signed)
Clinical Social Work Department BRIEF PSYCHOSOCIAL ASSESSMENT 08/05/2013  Patient:  Becky Collins, Becky Collins     Account Number:  [id number]     Admit date:  07/30/2013  Clinical Social Worker:  Harless Nakayama  Date/Time:  08/05/2013 03:30 PM  Referred by:  Physician  Date Referred:  08/05/2013 Referred for  SNF Placement   Other Referral:   Interview type:  Family Other interview type:   Spoke with pt daughter who was at bedside    PSYCHOSOCIAL DATA Living Status:  FAMILY Admitted from facility:   Level of care:   Primary support name:  Rosemaria Inabinet [phone] Primary support relationship to patient:  CHILD, ADULT Degree of support available:   Pt has supportive family    CURRENT CONCERNS Current Concerns  Post-Acute Placement   Other Concerns:    SOCIAL WORK ASSESSMENT / PLAN CSW spoke to pt and pt daughter who was at bedside. Pt daughter aware of PT recommendation for SNF. CSW explained SNF referral process. Pt daughter informed CSW that pt has been to [facility] before and would like to return. Pt daughter also requested CSW ask if pt can be placed with her previous roommate again. CSW informed pt daughter that this request would be passed on to facility.   Assessment/plan status:  Psychosocial Support/Ongoing Assessment of Needs Other assessment/ plan:   Information/referral to community resources:   SNF list denied    PATIENT'S/FAMILY'S RESPONSE TO PLAN OF CARE: Pt and pt daughter agreeable to ST rehab for SNF.       Kamarri Fischetti, LCSWA [phone]

## 2013-08-05 NOTE — Evaluation (Addendum)
Physical Therapy Evaluation Patient Details Name: Becky Collins MRN: [id number] DOB: 1932-06-22 Today's Date: 08/05/2013 Time: 1029-1050 PT Time Calculation (min): 21 min  PT Assessment / Plan / Recommendation History of Present Illness  Pt adm with NSTEMI. Underwent PTCA and stenting of the left circumflex artery.  Clinical Impression  Pt admitted with above. Pt currently with functional limitations due to the deficits listed below (see PT Problem List).  Pt will benefit from skilled PT to increase her independence and safety with mobility to allow discharge SNF prior to return home. Pt becomes exhausted with minimal activity and doubt she can manage daily activities at home. She has had multiple hospitalizations and a stay at SNF in the past several months.     PT Assessment  Patient needs continued PT services    Follow Up Recommendations  SNF    Does the patient have the potential to tolerate intense rehabilitation      Barriers to Discharge        Equipment Recommendations  None recommended by PT    Recommendations for Other Services     Frequency Min 3X/week    Precautions / Restrictions Precautions Precautions: Fall   Pertinent Vitals/Pain See flow sheet.      Mobility  Bed Mobility Bed Mobility: Supine to Sit;Sitting - Scoot to Edge of Bed Supine to Sit: 4: Min assist;HOB elevated Sitting - Scoot to [place] of Bed: 4: Min assist Details for Bed Mobility Assistance: Assist to bring trunk up and hips to EOB. Transfers Transfers: Sit to Stand;Stand to Sit Sit to Stand: 4: Min assist;With upper extremity assist;From bed Stand to Sit: 4: Min assist;With upper extremity assist;With armrests;To chair/3-in-1 Details for Transfer Assistance: Assist to bring hips up. Ambulation/Gait Ambulation/Gait Assistance: 4: Min assist Ambulation Distance (Feet): 10 Feet Assistive device: Rolling walker Ambulation/Gait Assistance Details: Verbal cues to stay closer to walker and  stand more erect.   Gait Pattern: Step-through pattern;Decreased step length - right;Decreased step length - left;Shuffle;Trunk flexed Gait velocity: very slow    Exercises     PT Diagnosis: Difficulty walking;Generalized weakness  PT Problem List: Decreased strength;Decreased activity tolerance;Decreased balance;Decreased mobility;Decreased knowledge of use of DME PT Treatment Interventions: DME instruction;Gait training;Functional mobility training;Therapeutic activities;Therapeutic exercise;Patient/family education;Balance training     PT Goals(Current goals can be found in the care plan section) Acute Rehab PT Goals Patient Stated Goal: Pt wants to get stronger. PT Goal Formulation: With patient Time For Goal Achievement: 08/19/13 Potential to Achieve Goals: Good  Visit Information  Last PT Received On: 08/05/13 Assistance Needed: +1 History of Present Illness: Pt adm with NSTEMI. Underwent PTCA and stenting of the left circumflex artery.       Prior Functioning  Home Living Family/patient expects to be discharged to:: Private residence Living Arrangements: Children;Other relatives (Niece) Available Help at Discharge: Family;Available 24 hours/day Type of Home: House Home Access: Level entry Home Layout: One level Home Equipment: Bedside commode;Walker - 2 wheels;Cane - single point;Wheelchair - manual Additional Comments: Pt was at SNF until mid September and has been back to hospital several times since then. Prior Function Level of Independence: Independent with assistive device(s) Comments: Amb with walker. Communication Communication: No difficulties Dominant Hand: Right    Cognition  Cognition Arousal/Alertness: Awake/alert Behavior During Therapy: WFL for tasks assessed/performed Overall Cognitive Status: Within Functional Limits for tasks assessed    Extremity/Trunk Assessment Upper Extremity Assessment Upper Extremity Assessment: Defer to OT  evaluation Lower Extremity Assessment Lower Extremity Assessment: Generalized weakness  Balance Balance Balance Assessed: Yes Static Standing Balance Static Standing - Balance Support: Bilateral upper extremity supported Static Standing - Level of Assistance: 4: Min assist  End of Session PT - End of Session Equipment Utilized During Treatment: Gait belt Activity Tolerance: Patient limited by fatigue Patient left: in chair;with call bell/phone within reach;with family/visitor present Nurse Communication: Mobility status  GP     Ayen Viviano 08/05/2013, 12:09 PM  [employer] PT [phone]

## 2013-08-05 NOTE — Progress Notes (Signed)
Patient crying and complained of chest pain.  Vital signs taken and EKG done.  While doing EKG, patient stated that her pain began after drinking the orange juice for breakfast.  EKG was similar to previous done. Patient was given GI cocktail and rested comfortably after.  Will continue to monitor patient.  Becky Collins

## 2013-08-05 NOTE — Progress Notes (Signed)
CARDIAC REHAB PHASE I   PRE:  Rate/Rhythm: 73 SR    BP: sitting 94/55    SaO2: 96 RA  MODE:  Ambulation: to door   POST:  Rate/Rhythm: 90 SR    BP: sitting 106/48     SaO2: 96 RA  Pt reluctant but willing to walk. Sts she feels bad today, SOB. Rest on side of bed before standing. Able to walk to door of room with RW and family's encouragement however then sts she felt she would pass out and that she had to go back. Sts she was SOB and dizziness when asked. Able to return to recliner. BP low but stable. No major change in vitals. Left in recliner with family present. Pt also c/o CP. [id number]   Elissa Lovett Bishop CES, ACSM 08/05/2013 3:26 PM

## 2013-08-05 NOTE — Progress Notes (Addendum)
Clinical Social Work Department CLINICAL SOCIAL WORK PLACEMENT NOTE 08/05/2013  Patient:  Becky Collins, Becky Collins  Account Number:  [id number] Admit date:  07/30/2013  Clinical Social Worker:  Harless Nakayama  Date/time:  08/05/2013 04:20 PM  Clinical Social Work is seeking post-discharge placement for this patient at the following level of care:   SKILLED NURSING   (*CSW will update this form in Epic as items are completed)   08/05/2013  Patient/family provided with [facility] Department of Clinical Social Work's list of facilities offering this level of care within the geographic area requested by the patient (or if unable, by the patient's family).  08/05/2013  Patient/family informed of their freedom to choose among providers that offer the needed level of care, that participate in Medicare, Medicaid or managed care program needed by the patient, have an available bed and are willing to accept the patient.  08/05/2013  Patient/family informed of [facility]' ownership interest in [facility], as well as of the fact that they are under no obligation to receive care at this facility.  PASARR submitted to EDS on  PASARR number received from EDS on  Existing  FL2 transmitted to all facilities in geographic area requested by pt/family on  08/05/2013 FL2 transmitted to all facilities within larger geographic area on   Patient informed that his/her managed care company has contracts with or will negotiate with  certain facilities, including the following:     Patient/family informed of bed offers received:  08/06/2013 Patient chooses bed at [facility] Physician recommends and patient chooses bed at    Patient to be transferred to [facility] on  08/09/2013 Patient to be transferred to facility by [facility]  The following physician request were entered in Epic:   Additional Comments:  Carey Johndrow, LCSWA [phone]

## 2013-08-05 NOTE — Progress Notes (Signed)
OT Cancellation Note  Patient Details Name: Becky Collins MRN: [id number] DOB: 01-Jun-1932   Cancelled Treatment:     C/M secondary to pt c/o chest pain (not rated). RN notified and aware & in pt room. Will check back as able.  Mariam Dollar [name] 08/05/2013, 8:16 AM

## 2013-08-06 ENCOUNTER — Inpatient Hospital Stay (HOSPITAL_COMMUNITY): Payer: Medicare Other

## 2013-08-06 LAB — BASIC METABOLIC PANEL
Calcium: 10.1 mg/dL (ref 8.4–10.5)
Chloride: 95 mEq/L — ABNORMAL LOW (ref 96–112)
Creatinine, Ser: 2.01 mg/dL — ABNORMAL HIGH (ref 0.50–1.10)
GFR calc Af Amer: 26 mL/min — ABNORMAL LOW (ref >90)

## 2013-08-06 MED ORDER — INSULIN ASPART PROT & ASPART (70-30 MIX) 100 UNIT/ML ~~LOC~~ SUSP
23.0000 [IU] | Freq: Two times a day (BID) | SUBCUTANEOUS | Status: DC
  Administered 2013-08-06 – 2013-08-09 (×6): 23 [IU] via SUBCUTANEOUS
  Filled 2013-08-06: qty 10

## 2013-08-06 NOTE — Progress Notes (Signed)
Chaplain offered emotional and spiritual support to pt. Upon introduction, pt said, "I've been here for week, where have you been?" Pt was very receptive to visit. Pt expressed her need for "strength," physical and emotional and spiritual. Pt believes, "it's all in God's hands." Pt said she is "tired" of the cycle of hospital - health center - hospital, but believes that "God has a reason for keeping me here."   Pt said she was very grateful for chaplain's presence, that "God had a reason" for it.  Chaplain explained that pt can ask her nurse anytime for a visit from the chaplain. Chaplain provided emotional and spiritual support, empathic listening, presence, and prayer.   Maurene Capes, [place] [phone]

## 2013-08-06 NOTE — Progress Notes (Signed)
Inpatient Diabetes Program Recommendations  [facility]: New Consensus Statement on Inpatient Glycemic Control (2013)  Target Ranges:  Prepandial:   less than 140 mg/dL      Peak postprandial:   less than 180 mg/dL (1-2 hours)      Critically ill patients:  140 - 180 mg/dL     Results for Becky, Collins (MRN [id number]) as of 08/06/2013 08:46  Ref. Range 08/05/2013 07:47 08/05/2013 11:36 08/05/2013 16:09 08/05/2013 21:01  Glucose-Capillary Latest Range: 70-99 mg/dL 409 (H) 811 (H) 914 (H) 100 (H)    Results for Becky, JEDLICKA (MRN [id number]) as of 08/06/2013 08:46  Ref. Range 08/06/2013 07:37  Glucose-Capillary Latest Range: 70-99 mg/dL 086 (H)    **Fasting glucose still mildly elevated  **MD- Please increase 70/30 insulin to 23 units bid with meals   Will follow. Ambrose Finland RN, MSN, CDE Diabetes Coordinator Inpatient Diabetes Program Team Pager: [phone] (8a-10p)

## 2013-08-06 NOTE — Progress Notes (Signed)
CSW (Clinical Child psychotherapist) informed by [facility] that they are able to offer a bed. CSW informed facility that pt may be ready for dc tomorrow.   Maxine Huynh, LCSWA [phone]

## 2013-08-06 NOTE — Evaluation (Signed)
Occupational Therapy Evaluation Patient Details Name: Becky Collins MRN: [id number] DOB: 01-25-1932 Today's Date: 08/06/2013 Time: 7829-5621 OT Time Calculation (min): 37 min  OT Assessment / Plan / Recommendation History of present illness Pt adm with NSTEMI. Underwent PTCA and stenting of the left circumflex artery.   Clinical Impression   Pt admitted as per above. She presents w/ overall deconditioning and weakness affecting her ability to perform ADL's and self care tasks (see OT problem list below). She should benefit from acute OT followed by SNF rehab to assist w/ ADL retraining in preparation for increased independence prior to retuning home.   OT Assessment  Patient needs continued OT Services    Follow Up Recommendations  SNF    Barriers to Discharge      Equipment Recommendations  None recommended by OT    Recommendations for Other Services    Frequency  Min 2X/week    Precautions / Restrictions Precautions Precautions: Fall Restrictions Weight Bearing Restrictions: No   Pertinent Vitals/Pain No c/o.    ADL  Eating/Feeding: Performed;Independent Where Assessed - Eating/Feeding: Bed level Grooming: Performed;Wash/dry hands;Wash/dry face;Teeth care;Set up Where Assessed - Grooming: Unsupported sitting Upper Body Bathing: Performed;Set up Where Assessed - Upper Body Bathing: Unsupported sitting Lower Body Bathing: Simulated;Minimal assistance Where Assessed - Lower Body Bathing: Supported sit to stand Upper Body Dressing: Performed;Set up Where Assessed - Upper Body Dressing: Unsupported sitting Lower Body Dressing: Simulated;Minimal assistance Where Assessed - Lower Body Dressing: Unsupported sit to stand Toilet Transfer: Simulated;Min guard (Pt transferred around bed to chair/recliner, declined 3:1) Toilet Transfer Method: Sit to Barista: Bedside commode Toileting - Clothing Manipulation and Hygiene: Simulated;Min guard (Pt managed  clothing/gowns prior to sitting in chair) Where Assessed - Toileting Clothing Manipulation and Hygiene: Standing;Sit to stand from 3-in-1 or toilet Tub/Shower Transfer Method: Not assessed Equipment Used: Rolling walker Transfers/Ambulation Related to ADLs: Pt was overall Min guard assist for functional mobility in room and transfers. She is deconditioned and benefitted from vc's for breathing techniques ADL Comments: Pt presents w/ overall deconditioning and appears anxious at times. Benefits from increased time for tasks as well as vc's for energy conservation and breathing techniques. She participated in ADL's this am.    OT Diagnosis: Generalized weakness  OT Problem List: Decreased strength;Decreased activity tolerance;Decreased knowledge of use of DME or AE;Decreased knowledge of precautions;Cardiopulmonary status limiting activity OT Treatment Interventions: Self-care/ADL training;Therapeutic exercise;Energy conservation;DME and/or AE instruction;Patient/family education;Therapeutic activities   OT Goals(Current goals can be found in the care plan section) Acute Rehab OT Goals Patient Stated Goal: Pt wants to get stronger and go home when able Time For Goal Achievement: 08/20/13 Potential to Achieve Goals: Good  Visit Information  Last OT Received On: 08/06/13 Assistance Needed: +1 History of Present Illness: Pt adm with NSTEMI. Underwent PTCA and stenting of the left circumflex artery.       Prior Functioning     Home Living Family/patient expects to be discharged to:: Private residence Living Arrangements: Children;Other relatives Available Help at Discharge: Family;Available 24 hours/day Type of Home: House Home Access: Level entry Home Layout: One level Home Equipment: Bedside commode;Walker - 2 wheels;Cane - single point;Wheelchair - manual;Grab bars - toilet;Grab bars - tub/shower Additional Comments: Pt was at SNF until mid September and has been back to hospital  several times since then. Prior Function Level of Independence: Needs assistance Gait / Transfers Assistance Needed: Uses RW ADL's / Homemaking Assistance Needed: Assist w3 tub/bathing; frequent rest breaks Comments: Amb  with walker. Communication Communication: No difficulties Dominant Hand: Right    Vision/Perception Vision - History Baseline Vision: Wears glasses all the time Patient Visual Report: No change from baseline   Cognition  Cognition Arousal/Alertness: Awake/alert Behavior During Therapy: WFL for tasks assessed/performed Overall Cognitive Status: Within Functional Limits for tasks assessed    Extremity/Trunk Assessment Upper Extremity Assessment Upper Extremity Assessment: Generalized weakness Lower Extremity Assessment Lower Extremity Assessment: Generalized weakness    Mobility Bed Mobility Bed Mobility: Supine to Sit;Sitting - Scoot to Edge of Bed Supine to Sit: HOB elevated;4: Min guard Sitting - Scoot to Edge of Bed: 4: Min guard;With rail Transfers Transfers: Sit to Stand;Stand to Sit Sit to Stand: 4: Min guard;From bed;From chair/3-in-1;With upper extremity assist Stand to Sit: With upper extremity assist;With armrests;To chair/3-in-1;4: Min guard        Balance Balance Balance Assessed: Yes Static Standing Balance Static Standing - Balance Support: Bilateral upper extremity supported Static Standing - Level of Assistance: 4: Min assist   End of Session OT - End of Session Equipment Utilized During Treatment: Rolling walker Activity Tolerance: Patient tolerated treatment well;Other (comment) (Fatigues easily, deconditioned) Patient left: in chair;with call bell/phone within reach Nurse Communication: Mobility status  GO     Alm Bustard 08/06/2013, 8:30 AM

## 2013-08-06 NOTE — Progress Notes (Signed)
CARDIAC REHAB PHASE I   PRE:  Rate/Rhythm: 86 Pacing  BP:  Supine:   Sitting: 129/67  Standing:    SaO2: 99 RA  MODE:  Ambulation: 56 ft   POST:  Rate/Rhythm: 111  BP:  Supine:   Sitting: 120/66  Standing:    SaO2: 97 RA 1430-1450 On arrival pt in recliner. Assisted X 2 used walker and gait belt to ambulate. Pt c/o of feeling bad and that she can not walk, she sat back down in recliner. We gave her encouragement and got her to try again. She was able to walk 56 feet with one standing rest stop. Pt totally exhausted by end of walk. She acts as though she feels really bad. Unsure if pt is deconditioned or feels really that bad. VS all stable. Pt back to recliner after walk with call light in reach.  Melina Copa RN 08/06/2013 2:48 PM

## 2013-08-06 NOTE — Progress Notes (Signed)
Patient ID: Becky Collins, female   DOB: 12/12/1931, 79 y.o.   MRN: [id number]    SUBJECTIVE:  Patient is stable today. Her mental status is better than I have seen it. She still says she has various difficulties with pain and shortness of breath.  Filed Vitals:   08/05/13 1419 08/05/13 2013 08/06/13 0100 08/06/13 0458  BP: 110/54 126/60 129/71 131/62  Pulse: 75 80 78 80  Temp: 97.9 F (36.6 C) 98.8 F (37.1 C)  97.8 F (36.6 C)  TempSrc:  Oral  Oral  Resp: 15 16  16   Height:      Weight:    176 lb 9.4 oz (80.1 kg)  SpO2: 96% 98% 96% 100%     Intake/Output Summary (Last 24 hours) at 08/06/13 1047 Last data filed at 08/06/13 0535  Gross per 24 hour  Intake    120 ml  Output    600 ml  Net   -480 ml    LABS: Basic Metabolic Panel:  Recent Labs  14/78/29 0042 08/06/13 0520  NA 137 135  K 4.7 4.2  CL 100 95*  CO2 25 22  GLUCOSE 230* 165*  BUN 23 32*  CREATININE 1.77* 2.01*  CALCIUM 9.8 10.1   Liver Function Tests: No results found for this basename: AST, ALT, ALKPHOS, BILITOT, PROT, ALBUMIN,  in the last 72 hours No results found for this basename: LIPASE, AMYLASE,  in the last 72 hours CBC: No results found for this basename: WBC, NEUTROABS, HGB, HCT, MCV, PLT,  in the last 72 hours Cardiac Enzymes: No results found for this basename: CKTOTAL, CKMB, CKMBINDEX, TROPONINI,  in the last 72 hours BNP: No components found with this basename: POCBNP,  D-Dimer: No results found for this basename: DDIMER,  in the last 72 hours Hemoglobin A1C: No results found for this basename: HGBA1C,  in the last 72 hours Fasting Lipid Panel:  Recent Labs  08/05/13 0042  CHOL 137  HDL 28*  LDLCALC 82  TRIG 562  CHOLHDL 4.9   Thyroid Function Tests: No results found for this basename: TSH, T4TOTAL, FREET3, T3FREE, THYROIDAB,  in the last 72 hours  RADIOLOGY: Dg Chest 2 View  08/03/2013   CLINICAL DATA:  Status post cardiac surgery, chest pain  EXAM: CHEST  2 VIEW   COMPARISON:  07/30/2013  FINDINGS: Stable mild cardiomegaly. Two lead cardiac pacer again identified. Mild to moderate vascular congestion and mild interstitial pulmonary edema. Small bilateral pleural effusions.  IMPRESSION: Evidence of congestive heart failure with mild pulmonary edema slightly worse when compared to the prior study.   Electronically Signed   By: Esperanza Heir M.D.   On: 08/03/2013 12:50   Dg Chest 2 View  07/30/2013   CLINICAL DATA:  Shortness of breath and chest pain.  EXAM: CHEST  2 VIEW  COMPARISON:  Multiple priors  FINDINGS: Cardiac pacing device is unchanged. The cardiomediastinal silhouette is unchanged. No frank edema. Bibasilar opacities are identified, increased on the left. Similar left pleural effusion. Decreased right pleural effusion No pleural effusion or pneumothorax. No acute osseous abnormality.  IMPRESSION: 1. Bibasilar opacities, increased on the left. Findings represent atelectasis versus infiltrate.  2. Bilateral pleural effusions, similar on the left and decreased on the right.   Electronically Signed   By: Jerene Dilling M.D.   On: 07/30/2013 16:32   Dg Chest 2 View  07/26/2013   CLINICAL DATA:  Decreased breath sounds at the right base.  EXAM: CHEST  2  VIEW  COMPARISON:  Multiple priors  FINDINGS: Cardiac pacing device is unchanged. The cardiomediastinal silhouette is unchanged. No frank edema. Bibasilar opacities. Low lung volumes. No pneumothorax. Bilateral small pleural effusions.  IMPRESSION: Bibasilar opacities, likely representing atelectasis. Developing infection is not excluded. Small bilateral pleural effusions.   Electronically Signed   By: Jerene Dilling M.D.   On: 07/26/2013 09:47   Ct Head Wo Contrast  07/31/2013   CLINICAL DATA:  Stroke.  EXAM: CT HEAD WITHOUT CONTRAST  TECHNIQUE: Contiguous axial images were obtained from the base of the skull through the vertex without intravenous contrast.  COMPARISON:  CT 07/22/2013  FINDINGS:  Generalized atrophy. Negative for acute infarct. Negative for hemorrhage or mass.  Mucosal edema in the sphenoid sinus. No acute bony abnormality.  IMPRESSION: No acute intracranial abnormality.   Electronically Signed   By: Marlan Palau M.D.   On: 07/31/2013 21:49   Ct Head Wo Contrast  07/22/2013   CLINICAL DATA:  History of hypertension, diabetes, and heart disease. Confusion.  EXAM: CT HEAD WITHOUT CONTRAST  TECHNIQUE: Contiguous axial images were obtained from the base of the skull through the vertex without contrast.  COMPARISON:  07/20/2013.  FINDINGS: Stable atrophy and chronic microvascular ischemic change. No evidence for acute infarction, hemorrhage, mass lesion, hydrocephalus, or extra-axial fluid. Calvarium intact. Advanced vascular calcification. Minimal sinus fluid is stable. No mastoid inflammatory process.  IMPRESSION: Stable atrophy and chronic microvascular ischemic change. No acute intracranial findings.   Electronically Signed   By: Davonna Belling M.D.   On: 07/22/2013 18:27   Ct Head Wo Contrast  07/20/2013   *RADIOLOGY REPORT*  Clinical Data: Confusion  CT HEAD WITHOUT CONTRAST  Technique:  Contiguous axial images were obtained from the base of the skull through the vertex without contrast.  Comparison: Prior MRI from 08/16/2011  Findings: Mild prominence of the CSF containing spaces was compatible with generalized atrophy. Scattered and confluent hypodensities within the periventricular deep white matter most compatible with chronic microvascular ischemic disease.  Remote right parietal infarct is noted, unchanged as compared to prior MRI.  No acute intracranial hemorrhage is identified.  There is no large vessel territory infarct.  No extra-axial fluid collection. No midline shift or mass lesion.  Calvarium is intact.  Orbital soft tissues are within normal limits.  Paranasal sinuses and mastoid air cells are clear.  IMPRESSION: 1. No acute intracranial process.  2. Unchanged remote  right parietal infarct.  3. Mild age related atrophy and chronic microvascular ischemic disease, similar to prior.   Original Report Authenticated By: Rise Mu, M.D.    PHYSICAL EXAM    Patient is lying flat in bed. She says she still has shortness of breath. The assessment is quite difficult. There is no edema. There is still question of some rales. These may be chronic changes and not represent volume. Chest x-ray will be done.   TELEMETRY:    I reviewed telemetry today August 06, 2013. The rhythm is paced.   ASSESSMENT AND PLAN:     NSTEMI (non-ST elevated myocardial infarction)      Decisions were made very carefully about proceeding with the intervention last week. Ultimately this was done. The procedure went very well. The patient has had a troponin rise after the procedure. Unfortunately she continues to have chest discomfort. All along her discomfort has been very difficult to assess. Currently it is felt that her chest discomfort is not cardiac.    HTN (hypertension)   HLD (hyperlipidemia)  Carotid artery disease   Atherosclerosis of native arteries of the extremities with intermittent claudication   Ejection fraction   Mitral regurgitation   History of seizure disorde    CKD (chronic kidney disease), stage III     Patient now has worsening renal function. This has occurred while we have been trying to diuresis her. In addition she did have a dye load but it was last week. I have stopped her Lasix as of this morning. I've ordered chemistry lab for tomorrow.    UTI (urinary tract infection)       This is being treated.    CAD (coronary artery disease)   Pacemaker    Abnormal mini-mental status exam     Patient was seen by neurology. It was felt that she had some changes related to her urinary tract infection and hospitalization. There may be also some dementia. It is hoped that her mental status will continue to improve over time.    Chest wall pain    This  continues to be a problem. I had considered trying nonsteroidal anti-inflammatory meds despite her coronary disease. However with her renal dysfunction this cannot be done. There is question that she gets some relief from GI cocktails. She is receiving Protonix twice a day     Dysphagia    Acute on chronic diastolic CHF (congestive heart failure)     Initially with presentation there was some heart failure. She was also felt to be wet several days ago. Unfortunately with some diuresis her creatinine is climbing. As of today I put her diuretics on hold. As she gets further out from her catheterization dye we will have to consider whether diuresis can be attempted again. I have ordered a followup chest x-ray for today to get a new baseline with her current volume status.    Type 2 diabetes mellitus with renal manifestations     I've made the changes in the insulin as recommended by the diabetes team. I appreciate the help.    Odynophagia   Deconditioning  I appreciate the help from OT and PT.  Nursing home placement   This is being assessed by social work and the family. The patient deftly needs to go to a nursing home from this hospital.  The overall plan will be to try to completely stabilize the patient's volume status and renal status. When these are stabilized she can be discharged to a nursing home. We will not plan to repeat cardiac catheterization for her current chest pain.   Willa Rough 08/06/2013 10:47 AM

## 2013-08-06 NOTE — Progress Notes (Signed)
CSW (Clinical Child psychotherapist) received call from [facility] (which is pt first choice for SNF) who informed CSW that pt has outstanding bill that will need to be paid first. Facility will be calling pt daughter to speak about this. CSW called pt daughter to inform that facility will be giving her a call and also to inform of other bed offers. Pt daughter to call CSW back later today after she has had a chance to speak with [facility].  Antrice Pal, LCSWA [phone]

## 2013-08-07 DIAGNOSIS — N179 Acute kidney failure, unspecified: Secondary | ICD-10-CM

## 2013-08-07 LAB — BASIC METABOLIC PANEL
BUN: 38 mg/dL — ABNORMAL HIGH (ref 6–23)
CO2: 23 mEq/L (ref 19–32)
Chloride: 94 mEq/L — ABNORMAL LOW (ref 96–112)
Creatinine, Ser: 1.99 mg/dL — ABNORMAL HIGH (ref 0.50–1.10)
Potassium: 4.2 mEq/L (ref 3.5–5.1)
Sodium: 131 mEq/L — ABNORMAL LOW (ref 135–145)

## 2013-08-07 LAB — GLUCOSE, CAPILLARY
Glucose-Capillary: 143 mg/dL — ABNORMAL HIGH (ref 70–99)
Glucose-Capillary: 192 mg/dL — ABNORMAL HIGH (ref 70–99)
Glucose-Capillary: 141 mg/dL — ABNORMAL HIGH (ref 70–99)

## 2013-08-07 NOTE — Progress Notes (Signed)
Patient ID: Becky Collins, female   DOB: 1932/09/04, 79 y.o.   MRN: [id number]    SUBJECTIVE: "I feel better."   Patient says she does not have as much chest pain, only mild.  She is not short of breath in bed though she appears to be a bit tachypneic.  Creatinine may have plateaued.   Marland Kitchen amLODipine  5 mg Oral Daily  . ampicillin  250 mg Oral Q6H  . aspirin  81 mg Oral QHS  . atorvastatin  80 mg Oral q1800  . brimonidine  1 drop Both Eyes BID  . calcium-vitamin D  1 tablet Oral Q breakfast  . cholecalciferol  1,000 Units Oral Daily  . clopidogrel  75 mg Oral Q breakfast  . heparin  5,000 Units Subcutaneous Q8H  . insulin aspart  0-15 Units Subcutaneous TID WC  . insulin aspart protamine- aspart  23 Units Subcutaneous BID WC  . isosorbide mononitrate  60 mg Oral Daily  . metoprolol tartrate  25 mg Oral BID  . multivitamin with minerals  1 tablet Oral Daily  . pantoprazole  40 mg Oral BID AC  . sertraline  100 mg Oral q morning - 10a  . sodium chloride  3 mL Intravenous Q12H      Filed Vitals:   08/06/13 0458 08/06/13 2037 08/07/13 0432 08/07/13 0500  BP: 131/62 109/54 131/67   Pulse: 80 83 79   Temp: 97.8 F (36.6 C) 97.9 F (36.6 C) 97.9 F (36.6 C)   TempSrc: Oral Axillary Oral   Resp: 16  18   Height:      Weight: 80.1 kg (176 lb 9.4 oz)   79.9 kg (176 lb 2.4 oz)  SpO2: 100% 100% 97%    No intake or output data in the 24 hours ending 08/07/13 0729  LABS: Basic Metabolic Panel:  Recent Labs  40/98/11 0520 08/07/13 0534  NA 135 131*  K 4.2 4.2  CL 95* 94*  CO2 22 23  GLUCOSE 165* 168*  BUN 32* 38*  CREATININE 2.01* 1.99*  CALCIUM 10.1 10.1   Liver Function Tests: No results found for this basename: AST, ALT, ALKPHOS, BILITOT, PROT, ALBUMIN,  in the last 72 hours No results found for this basename: LIPASE, AMYLASE,  in the last 72 hours CBC: No results found for this basename: WBC, NEUTROABS, HGB, HCT, MCV, PLT,  in the last 72 hours Cardiac Enzymes: No  results found for this basename: CKTOTAL, CKMB, CKMBINDEX, TROPONINI,  in the last 72 hours BNP: No components found with this basename: POCBNP,  D-Dimer: No results found for this basename: DDIMER,  in the last 72 hours Hemoglobin A1C: No results found for this basename: HGBA1C,  in the last 72 hours Fasting Lipid Panel:  Recent Labs  08/05/13 0042  CHOL 137  HDL 28*  LDLCALC 82  TRIG 914  CHOLHDL 4.9   Thyroid Function Tests: No results found for this basename: TSH, T4TOTAL, FREET3, T3FREE, THYROIDAB,  in the last 72 hours Anemia Panel: No results found for this basename: VITAMINB12, FOLATE, FERRITIN, TIBC, IRON, RETICCTPCT,  in the last 72 hours  RADIOLOGY: Dg Chest 2 View  08/06/2013   CLINICAL DATA:  History of coronary artery disease, hypertension, and diabetes mellitus with recent stent and pacemaker placement.  EXAM: CHEST  2 VIEW  COMPARISON:  Comparison is made to study of August 03, 2013.  FINDINGS: The lungs are adequately inflated. There are small bilateral pleural effusions layering posteriorly. The cardiac  silhouette is mildly enlarged. The pulmonary vascularity is not engorged. The permanent pacemaker appears to be in good position. There is no pneumothorax or pneumomediastinum. The observed portions of the bony thorax appear normal for age.  IMPRESSION: 1. There small bilateral pleural effusions layering posteriorly. There is no overt evidence of pulmonary edema. Low-grade compensated CHF may be present however.  2. There is no evidence of pneumonia.   Electronically Signed   By: David  [place]   On: 08/06/2013 13:10   Dg Chest 2 View  08/03/2013   CLINICAL DATA:  Status post cardiac surgery, chest pain  EXAM: CHEST  2 VIEW  COMPARISON:  07/30/2013  FINDINGS: Stable mild cardiomegaly. Two lead cardiac pacer again identified. Mild to moderate vascular congestion and mild interstitial pulmonary edema. Small bilateral pleural effusions.  IMPRESSION: Evidence of congestive  heart failure with mild pulmonary edema slightly worse when compared to the prior study.   Electronically Signed   By: Esperanza Heir M.D.   On: 08/03/2013 12:50    PHYSICAL EXAM General: NAD Neck: No JVD, no thyromegaly or thyroid nodule.  Lungs: Clear to auscultation bilaterally with normal respiratory effort. CV: Nondisplaced PMI.  Heart regular S1/S2, no S3/S4, no murmur.  No peripheral edema.   Abdomen: Soft, nontender, no hepatosplenomegaly, no distention.  Neurologic: Alert and oriented x 3.  Psych: Normal affect. Extremities: No clubbing or cyanosis.   TELEMETRY: Reviewed telemetry pt in paced rhythm  ASSESSMENT AND PLAN: 79 yo with history of PCM for CHB, CAD, diastolic CHF now s/p PCI to LCx with acute/chronic diastolic CHF this admission and AKI.  1. CAD: Stable.  PCI to LCx this admission.  She has some chronic CP that is probably not cardiac.  This is improved today.  Continue ASA/Plavix and statin.  2. Diastolic CHF: Acute on chronic.  Diuretics held with creatinine rise.  Suspect she still has some volume overload but not marked.  I think creatinine has peaked today.  Most likely will restart po Lasix tomorrow.  3. Renal: AKI on CKD.  Possible component of toxic contrast induced nephropathy + diuresis.  Creatinine appears to have plateaued, hopefully will start coming down tomorrow.  4. If creatinine improves tomorrow, possible discharge to SNF tomorrow.   Marca Ancona 08/07/2013 7:36 AM

## 2013-08-07 NOTE — Progress Notes (Signed)
Occupational Therapy Treatment Patient Details Name: Becky Collins MRN: [id number] DOB: [birth year] Today's Date: 08/07/2013 Time: 0800-0850 OT Time Calculation (min): 50 min  OT Assessment / Plan / Recommendation  History of present illness Pt adm with NSTEMI. Underwent PTCA and stenting of the left circumflex artery.   OT comments  Pt participating in ADL's & functional transfers at sink level today. She cont to be deconditioned and SOB w/ all activity noted. Cont w/ OT goals/POC. Discharge plan remains appropriate for SNF.  Follow Up Recommendations  SNF    Barriers to Discharge       Equipment Recommendations  None recommended by OT    Recommendations for Other Services    Frequency Min 2X/week   Progress towards OT Goals Progress towards OT goals: Progressing toward goals  Plan Discharge plan remains appropriate    Precautions / Restrictions Precautions Precautions: Fall Restrictions Weight Bearing Restrictions: No   Pertinent Vitals/Pain No c/o pain. C/O SOB w/ all activity. RN aware, repositioned, pt education energy conservation tech's & breathing tech's.    ADL  Grooming: Performed;Wash/dry hands;Wash/dry face;Teeth care;Set up;Modified independent (Increased time for tasks secondary to SOB, need for rest breaks) Where Assessed - Grooming: Supported sitting (Sitting at sink level) Upper Body Bathing: Performed;Chest;Right arm;Left arm;Abdomen;Set up;Supervision/safety (vc's for safety, energy conservation techniques) Where Assessed - Upper Body Bathing: Supported sitting Lower Body Bathing: Performed;Minimal assistance Where Assessed - Lower Body Bathing: Supported sit to stand Upper Body Dressing: Performed;Set up;Supervision/safety Where Assessed - Upper Body Dressing: Supported sitting Lower Body Dressing: Performed;Minimal assistance Where Assessed - Lower Body Dressing: Supported sit to stand Toilet Transfer: Simulated;Minimal assistance (Pt amb in room from  recliner to chair @ sink and back to recliner) Toilet Transfer Method: Sit to stand Toilet Transfer Equipment: Other (comment) (See above, pt using depends, perform peri care while bathing) Toileting - Clothing Manipulation and Hygiene: Performed;Other (comment);Minimal assistance (See above) Where Assessed - Toileting Clothing Manipulation and Hygiene: Sit to stand from 3-in-1 or toilet Equipment Used: Rolling walker;Other (comment) (2 L O2 throughout session) Transfers/Ambulation Related to ADLs: Pt min asssist for functional mobility and transfers today. She was SOB and benefits from 2 L O2 throughout session as well as vc's for safety using RW. ADL Comments: Pt cont to demonstrate deconditioning & was SOB. She benefits from 2 L O2 throughout session as well as education [id number] conservation techniques and breathing tech's during ADL's at sink.    OT Diagnosis:    OT Problem List:   OT Treatment Interventions:     OT Goals(current goals can now be found in the care plan section) Acute Rehab OT Goals Patient Stated Goal: Pt wants to get stronger and go home when able Time For Goal Achievement: 08/20/13 Potential to Achieve Goals: Good  Visit Information  Last OT Received On: 08/07/13 Assistance Needed: +1 History of Present Illness: Pt adm with NSTEMI. Underwent PTCA and stenting of the left circumflex artery.    Subjective Data      Prior Functioning       Cognition  Cognition Arousal/Alertness: Awake/alert Behavior During Therapy: WFL for tasks assessed/performed Overall Cognitive Status: Within Functional Limits for tasks assessed    Mobility  Bed Mobility Bed Mobility: Not assessed (Pt up in chair upon arrival) Transfers Transfers: Sit to Stand;Stand to Sit Sit to Stand: 4: Min guard;From bed;From chair/3-in-1;With upper extremity assist Stand to Sit: With upper extremity assist;With armrests;To chair/3-in-1;4: Min guard Details for Transfer Assistance: vc's for  safety and hand  placement.            End of Session OT - End of Session Equipment Utilized During Treatment: Rolling walker;Oxygen (2L O2 via nasal cannula throughout session.) Activity Tolerance: Patient tolerated treatment well Patient left: in chair;with call bell/phone within reach;with nursing/sitter in room Nurse Communication: Mobility status  GO     Alm Bustard 08/07/2013, 10:57 AM

## 2013-08-07 NOTE — Progress Notes (Signed)
Physical Therapy Treatment Patient Details Name: Becky Collins MRN: [id number] DOB: 09-Dec-1931 Today's Date: 08/07/2013 Time: 4098-1191 PT Time Calculation (min): 12 min  PT Assessment / Plan / Recommendation  History of Present Illness Pt adm with NSTEMI. Underwent PTCA and stenting of the left circumflex artery.   PT Comments   Pt very fatigued and became lightheaded when trying to amb.  Unable to obtain dynamap for BP in timely manner.  Pt with possible orthostasis.  Spoke with cardiac rehab and they will check BP sitting/standing in PM.  Follow Up Recommendations  SNF     Does the patient have the potential to tolerate intense rehabilitation     Barriers to Discharge        Equipment Recommendations  None recommended by PT    Recommendations for Other Services    Frequency Min 3X/week   Progress towards PT Goals Progress towards PT goals: Not progressing toward goals - comment  Plan Current plan remains appropriate    Precautions / Restrictions Precautions Precautions: Fall Precaution Comments: check orthostatics Restrictions Weight Bearing Restrictions: No   Pertinent Vitals/Pain See above.    Mobility  Bed Mobility Bed Mobility: Not assessed (Pt up in chair upon arrival) Transfers Sit to Stand: 4: Min assist;With upper extremity assist;With armrests;From chair/3-in-1 Stand to Sit: 4: Min assist;With upper extremity assist;With armrests;To chair/3-in-1 Details for Transfer Assistance: Assist to bring hips up. Ambulation/Gait Ambulation/Gait Assistance: 4: Min assist Ambulation Distance (Feet): 3 Feet Assistive device: Rolling walker Ambulation/Gait Assistance Details: Pt stopped after 3' and unable to go any further.  Pt then reported being "swimmy headed". Gait Pattern: Step-through pattern;Decreased step length - right;Decreased step length - left;Shuffle;Trunk flexed Gait velocity: very slow    Exercises     PT Diagnosis:    PT Problem List:   PT  Treatment Interventions:     PT Goals (current goals can now be found in the care plan section) Acute Rehab PT Goals Patient Stated Goal: Pt wants to get stronger and go home when able  Visit Information  Last PT Received On: 08/07/13 Assistance Needed: +1 History of Present Illness: Pt adm with NSTEMI. Underwent PTCA and stenting of the left circumflex artery.    Subjective Data  Patient Stated Goal: Pt wants to get stronger and go home when able   Cognition  Cognition Arousal/Alertness: Awake/alert Behavior During Therapy: WFL for tasks assessed/performed Overall Cognitive Status: Within Functional Limits for tasks assessed    Balance     End of Session PT - End of Session Activity Tolerance: Patient limited by fatigue Patient left: in chair;with call bell/phone within reach Nurse Communication: Other (comment) (possible orthostatic hypotension)   GP     Tamia Dial 08/07/2013, 12:34 PM  [facility] PT [phone]

## 2013-08-07 NOTE — Progress Notes (Signed)
CSW (Clinical Child psychotherapist) called and left message with facility to notify dc would likely not be today but dc would be soon.  Lannie Heaps, LCSWA [phone]

## 2013-08-07 NOTE — Progress Notes (Signed)
CARDIAC REHAB PHASE I   PRE:  Rate/Rhythm: 72 paced  BP:  Supine:   Sitting: 87/53  Standing: 76/42   Pt could not stand the whole time and had to sit prior to BP taken   SaO2: 97%RA  MODE:  Ambulation: 0 ft   4098-1191 Came to try to walk with pt. Pt very orthostatic and could not even stand long enough for BP to be taken. She c/o feeling shakey,legs weak, and dizzy. Sat pt back down and notified pt's RN. In recliner with call bell. Will follow up tomorrow.           Luetta Nutting, RN BSN  08/07/2013 2:22 PM

## 2013-08-08 LAB — BASIC METABOLIC PANEL
CO2: 24 mEq/L (ref 19–32)
Calcium: 10 mg/dL (ref 8.4–10.5)
Chloride: 96 mEq/L (ref 96–112)
Creatinine, Ser: 1.99 mg/dL — ABNORMAL HIGH (ref 0.50–1.10)
GFR calc non Af Amer: 23 mL/min — ABNORMAL LOW (ref >90)
Glucose, Bld: 134 mg/dL — ABNORMAL HIGH (ref 70–99)

## 2013-08-08 LAB — GLUCOSE, CAPILLARY
Glucose-Capillary: 194 mg/dL — ABNORMAL HIGH (ref 70–99)
Glucose-Capillary: 151 mg/dL — ABNORMAL HIGH (ref 70–99)
Glucose-Capillary: 163 mg/dL — ABNORMAL HIGH (ref 70–99)

## 2013-08-08 MED ORDER — FUROSEMIDE 40 MG PO TABS
40.0000 mg | ORAL_TABLET | Freq: Every day | ORAL | Status: DC
  Administered 2013-08-08 – 2013-08-09 (×2): 40 mg via ORAL
  Filled 2013-08-08 (×2): qty 1

## 2013-08-08 MED ORDER — ISOSORBIDE MONONITRATE ER 30 MG PO TB24
30.0000 mg | ORAL_TABLET | Freq: Every day | ORAL | Status: DC
  Administered 2013-08-08 – 2013-08-09 (×2): 30 mg via ORAL
  Filled 2013-08-08 (×2): qty 1

## 2013-08-08 MED ORDER — FLEET ENEMA 7-19 GM/118ML RE ENEM
1.0000 | ENEMA | Freq: Once | RECTAL | Status: AC
  Administered 2013-08-08: 22:00:00 via RECTAL
  Filled 2013-08-08: qty 1

## 2013-08-08 NOTE — Progress Notes (Signed)
Patient ID: Becky Collins, female   DOB: [birth year], 79 y.o.   MRN: [id number]    SUBJECTIVE: Creatinine stable today.  She says she feels "bad."   She had significant orthostatic symptoms yesterday with PT.  She is tachypneic.    Marland Kitchen ampicillin  250 mg Oral Q6H  . aspirin  81 mg Oral QHS  . atorvastatin  80 mg Oral q1800  . brimonidine  1 drop Both Eyes BID  . calcium-vitamin D  1 tablet Oral Q breakfast  . cholecalciferol  1,000 Units Oral Daily  . clopidogrel  75 mg Oral Q breakfast  . furosemide  40 mg Oral Daily  . heparin  5,000 Units Subcutaneous Q8H  . insulin aspart  0-15 Units Subcutaneous TID WC  . insulin aspart protamine- aspart  23 Units Subcutaneous BID WC  . isosorbide mononitrate  30 mg Oral Daily  . metoprolol tartrate  25 mg Oral BID  . multivitamin with minerals  1 tablet Oral Daily  . pantoprazole  40 mg Oral BID AC  . sertraline  100 mg Oral q morning - 10a  . sodium chloride  3 mL Intravenous Q12H      Filed Vitals:   08/07/13 0500 08/07/13 1300 08/07/13 2100 08/08/13 0412  BP:  104/52 132/67 126/75  Pulse:  68 88 77  Temp:  97.5 F (36.4 C) 98.2 F (36.8 C) 97.8 F (36.6 C)  TempSrc:  Oral Oral Oral  Resp:  18 18 18   Height:      Weight: 176 lb 2.4 oz (79.9 kg)   174 lb 13.2 oz (79.3 kg)  SpO2:  100% 98% 97%    Intake/Output Summary (Last 24 hours) at 08/08/13 0821 Last data filed at 08/07/13 2200  Gross per 24 hour  Intake    280 ml  Output    500 ml  Net   -220 ml    LABS: Basic Metabolic Panel:  Recent Labs  56/21/30 0534 08/08/13 0516  NA 131* 134*  K 4.2 4.1  CL 94* 96  CO2 23 24  GLUCOSE 168* 134*  BUN 38* 40*  CREATININE 1.99* 1.99*  CALCIUM 10.1 10.0   Liver Function Tests: No results found for this basename: AST, ALT, ALKPHOS, BILITOT, PROT, ALBUMIN,  in the last 72 hours No results found for this basename: LIPASE, AMYLASE,  in the last 72 hours CBC: No results found for this basename: WBC, NEUTROABS, HGB, HCT, MCV,  PLT,  in the last 72 hours Cardiac Enzymes: No results found for this basename: CKTOTAL, CKMB, CKMBINDEX, TROPONINI,  in the last 72 hours BNP: No components found with this basename: POCBNP,  D-Dimer: No results found for this basename: DDIMER,  in the last 72 hours Hemoglobin A1C: No results found for this basename: HGBA1C,  in the last 72 hours Fasting Lipid Panel: No results found for this basename: CHOL, HDL, LDLCALC, TRIG, CHOLHDL, LDLDIRECT,  in the last 72 hours Thyroid Function Tests: No results found for this basename: TSH, T4TOTAL, FREET3, T3FREE, THYROIDAB,  in the last 72 hours Anemia Panel: No results found for this basename: VITAMINB12, FOLATE, FERRITIN, TIBC, IRON, RETICCTPCT,  in the last 72 hours  RADIOLOGY: Dg Chest 2 View  08/06/2013   CLINICAL DATA:  History of coronary artery disease, hypertension, and diabetes mellitus with recent stent and pacemaker placement.  EXAM: CHEST  2 VIEW  COMPARISON:  Comparison is made to study of August 03, 2013.  FINDINGS: The lungs are adequately inflated.  There are small bilateral pleural effusions layering posteriorly. The cardiac silhouette is mildly enlarged. The pulmonary vascularity is not engorged. The permanent pacemaker appears to be in good position. There is no pneumothorax or pneumomediastinum. The observed portions of the bony thorax appear normal for age.  IMPRESSION: 1. There small bilateral pleural effusions layering posteriorly. There is no overt evidence of pulmonary edema. Low-grade compensated CHF may be present however.  2. There is no evidence of pneumonia.   Electronically Signed   By: David  [place]   On: 08/06/2013 13:10   Dg Chest 2 View  08/03/2013   CLINICAL DATA:  Status post cardiac surgery, chest pain  EXAM: CHEST  2 VIEW  COMPARISON:  07/30/2013  FINDINGS: Stable mild cardiomegaly. Two lead cardiac pacer again identified. Mild to moderate vascular congestion and mild interstitial pulmonary edema. Small  bilateral pleural effusions.  IMPRESSION: Evidence of congestive heart failure with mild pulmonary edema slightly worse when compared to the prior study.   Electronically Signed   By: Esperanza Heir M.D.   On: 08/03/2013 12:50    PHYSICAL EXAM General: NAD Neck: No JVD, no thyromegaly or thyroid nodule.  Lungs: Clear to auscultation bilaterally with normal respiratory effort. CV: Nondisplaced PMI.  Heart regular S1/S2, no S3/S4, no murmur.  No peripheral edema.   Abdomen: Soft, nontender, no hepatosplenomegaly, no distention.  Neurologic: Alert and oriented x 3.  Psych: Normal affect. Extremities: No clubbing or cyanosis.   TELEMETRY: Reviewed telemetry pt in paced rhythm  ASSESSMENT AND PLAN: 79 yo with history of PCM for CHB, CAD, diastolic CHF now s/p PCI to LCx with acute/chronic diastolic CHF this admission and AKI.  1. CAD: Stable.  PCI to LCx this admission.  She has some chronic CP that is probably not cardiac.  This is improved today.  Continue ASA/Plavix and statin.  2. Diastolic CHF: Acute on chronic.  Diuretics held with creatinine rise.  Suspect she still has some volume overload but not marked.  I think creatinine has peaked.  She is tachypneic and feels "bad."  I will try to gently start her back on po Lasix today.  3. Renal: AKI on CKD.  Possible component of toxic contrast induced nephropathy + diuresis.  Creatinine appears to have plateaued, hopefully will start coming down tomorrow.  4. Orthostatic symptoms: I will hold amlodipine and decrease Imdur today.  I will have nursing check orthostatic vitals.  5. I think we will need to hold off on SNF discharge until tomorrow.   Marca Ancona 08/08/2013 8:21 AM

## 2013-08-08 NOTE — Progress Notes (Signed)
Pt c/o abd pain and constipation. Pt requesting enema. Dr. Tresa Endo on call made aware and order placed. Upon administering enema hard stool noted at rectum. Extracted some of the stool and administered most of enema. Pt stated  She couldn't tolerate any more. Pt has only had small amount of stool so far but continues to cramp from the enema. Will have night shift continue to follow.

## 2013-08-09 DIAGNOSIS — K5900 Constipation, unspecified: Secondary | ICD-10-CM

## 2013-08-09 LAB — CBC
HCT: 36.5 % (ref 36.0–46.0)
Hemoglobin: 11.6 g/dL — ABNORMAL LOW (ref 12.0–15.0)
MCH: 25.1 pg — ABNORMAL LOW (ref 26.0–34.0)
MCHC: 31.8 g/dL (ref 30.0–36.0)
MCV: 78.8 fL (ref 78.0–100.0)
RBC: 4.63 MIL/uL (ref 3.87–5.11)

## 2013-08-09 LAB — BASIC METABOLIC PANEL
BUN: 43 mg/dL — ABNORMAL HIGH (ref 6–23)
CO2: 20 mEq/L (ref 19–32)
Calcium: 10.1 mg/dL (ref 8.4–10.5)
GFR calc Af Amer: 27 mL/min — ABNORMAL LOW (ref >90)
GFR calc non Af Amer: 23 mL/min — ABNORMAL LOW (ref >90)
Glucose, Bld: 260 mg/dL — ABNORMAL HIGH (ref 70–99)
Potassium: 4.7 mEq/L (ref 3.5–5.1)

## 2013-08-09 LAB — GLUCOSE, CAPILLARY
Glucose-Capillary: 271 mg/dL — ABNORMAL HIGH (ref 70–99)
Glucose-Capillary: 204 mg/dL — ABNORMAL HIGH (ref 70–99)

## 2013-08-09 MED ORDER — DOCUSATE SODIUM 100 MG PO CAPS: 100.0000 mg | ORAL_CAPSULE | Freq: Two times a day (BID) | ORAL | Status: DC

## 2013-08-09 MED ORDER — FUROSEMIDE 40 MG PO TABS: 40.0000 mg | ORAL_TABLET | Freq: Every day | ORAL | Status: AC

## 2013-08-09 MED ORDER — POLYETHYLENE GLYCOL 3350 17 G PO PACK
17.0000 g | PACK | Freq: Every day | ORAL | Status: DC
  Administered 2013-08-09: 17 g via ORAL
  Filled 2013-08-09: qty 1

## 2013-08-09 MED ORDER — CLOPIDOGREL BISULFATE 75 MG PO TABS: 75.0000 mg | ORAL_TABLET | Freq: Every day | ORAL | Status: AC

## 2013-08-09 MED ORDER — INSULIN NPH ISOPHANE & REGULAR (70-30) 100 UNIT/ML ~~LOC~~ SUSP: 23.0000 [IU] | Freq: Two times a day (BID) | SUBCUTANEOUS | Status: DC

## 2013-08-09 MED ORDER — POLYETHYLENE GLYCOL 3350 17 G PO PACK: 17.0000 g | PACK | Freq: Every day | ORAL | Status: DC

## 2013-08-09 MED ORDER — ATORVASTATIN CALCIUM 80 MG PO TABS: 80.0000 mg | ORAL_TABLET | Freq: Every day | ORAL | Status: DC

## 2013-08-09 MED ORDER — PANTOPRAZOLE SODIUM 40 MG PO TBEC: 40.0000 mg | DELAYED_RELEASE_TABLET | Freq: Two times a day (BID) | ORAL | Status: DC

## 2013-08-09 NOTE — Discharge Summary (Signed)
CARDIOLOGY DISCHARGE SUMMARY   Patient ID: Becky Collins MRN: [id number] DOB/AGE: Mar 31, 1932 79 y.o.  Admit date: 07/30/2013 Discharge date: 08/09/2013  Primary Discharge Diagnosis:     NSTEMI (non-ST elevated myocardial infarction) Secondary Discharge Diagnosis:    HTN (hypertension)   HLD (hyperlipidemia)   Carotid artery disease   Atherosclerosis of native arteries of the extremities with intermittent claudication   Mitral regurgitation   History of seizure disorder   CKD (chronic kidney disease), stage III   UTI (urinary tract infection)   Orthostatic hypotension   CAD (coronary artery disease)   Pacemaker   Abnormal mini-mental status exam   Dysphagia   Acute on chronic diastolic CHF (congestive heart failure) - weight at discharge is 168 pounds   Type 2 diabetes mellitus with renal manifestations   Odynophagia   Constipation   Deconditioning   UTI   Consults: Occupational therapy, cardiac rehabilitation, diabetes cordinator, physical therapy, neurology  Procedures: Rotational atherectomy,PTCA and stenting of the left circumflex artery, EEG, CT of the head without contrast  Hospital Course: Becky Collins is a 79 y.o. female with a history of CAD. She had a cardiac cath in September 2014 in the setting of complete heart block. Medical therapy for coronary artery disease is recommended. She was admitted earlier in October with an initial troponin slightly elevated but subsequent troponins were negative. There was concern for a UTI and she was seen by neurology because of altered mental status, but her mental status improved and medical therapy for coronary artery disease was still felt to be the best option.  She was admitted on 07/30/2013 with chest pain. She had bilateral pleural effusions and her troponin was high, at 0.97. She was anemic but this was unchanged. She was admitted for further evaluation and treatment.  1. NSTEMI: With recurrent chest pain it was felt  that she was failing medical therapy. When she was otherwise medically stable, cardiac catheterization was performed on 08/02/2013. She had rotational atherectomy, PTCA and stenting to the left circumflex was performed. She tolerated the procedure well.  2.  Acute on chronic diastolic CHF (congestive heart failure) - she had Lyme overload and was diuresis to carefully. Her renal function and electrolytes were followed closely. Her potassium levels remained stable but her BUN and creatinine trended up with diuresis. They peaked at 32/2.01. Her diuretics were adjusted and this will be followed closely as an outpatient. She lost 23 pounds during her hospital stay. Her weight at discharge is 168. This will need to be followed closely.  3. Altered mental status/abnormal Mini-Mental status exam: The patient reportedly has a history of seizures. However because of other medical problems, her Depakote was discontinued during her previous admission. She continued to have problems with confusion. There was also concern about some possible episodic right-sided weakness. A CT of the head showed no acute abnormality. Neurology was asked to evaluate her. She was seen by Dr. Thad Ranger who felt she might have a mild dementia at baseline, exacerbated by a recent infection. He felt it was reasonable for her mental clearing to be delayed and felt that she might have further improvement. He also felt she was likely to continue to have physical limitations. Her mental status did improve. An EEG was performed and was normal.  4. Chronic kidney disease: She has had stage III chronic kidney disease. However, with diuresis her renal function worsened. By discharge her BUN was 43 with a creatinine of 1.95. If this does  not improve, her kidney disease will be stage IV.  5. Diabetes: She had been on Lantus 54 units +3 times a day insulin at meals prior to admission. In the hospital, the Lantus was decreased and then discontinued because  of hypoglycemia. She was on twice a day injections with meals and this was uptitrated for better blood sugar control. She was also on sliding scale but generally only required a few units at meals. Her by mouth intake had been poor and this was contributing to her lower blood sugars. She will continue this dose at discharge, with further adjustments to be made as an outpatient as needed.  6. pacemaker: She had a pacemaker inserted during a previous admission. The pacemaker continued to function well and the site was without signs of infection.  7. Deconditioning: She was weak and had problems with orthostatic hypotension as well. She was seen by cardiac rehabilitation but it was felt that more intense therapy was needed. She was seen by physical therapy and occupational therapy. Skilled nursing facility placement was recommended. The patient agreed. Case management was involved and made the arrangements.  8. UTI: During her previous admission, she had a urine culture performed which showed enterococcus species. A urinalysis performed this admission showed leukocyte esterase and rare bacteria. She completed antibiotics prior to discharge.  9. Constipation: Ms. Rosamond had significant problems with constipation. She required some manual disimpaction but continued to have abdominal discomfort related to this. She is to be on medications to help with stool softening and continue these after discharge with when necessary medications as well.  10. Hyperlipidemia: She had been on simvastatin during her previous admission but this was stopped due to concerns of myopathy. Her CK has been normal and LFTs were normal as well. She was started on atorvastatin 80 mg because of her MI. A lipid panel was performed and results are below. She had no obvious myopathy from the statin at discharge.  11. Hypertension: She had been on medications for hypertension prior to admission. However, with diuresis her amlodipine was  discontinued. Her blood pressure is currently under good control on her current regimen and this will continue at discharge.  12. Orthostatic hypotension: Ms. Kruk had some problems with orthostatic hypotension. This may improve when she ambulates on a more regular basis but limited up titration of her medications and diuresis.  By 08/09/2013, discharge planning for Ms. Sami had been completed. Her volume status was felt to be improved and she had except the bed at [facility]. She was evaluated by Dr. Myrtis Ser and all data were reviewed. She is considered stable for discharge, in improved condition, to follow up as an outpatient.   Labs:  Lab Results  Component Value Date   WBC 14.9* 08/09/2013   HGB 11.6* 08/09/2013   HCT 36.5 08/09/2013   MCV 78.8 08/09/2013   PLT 410* 08/09/2013     Recent Labs Lab 08/09/13 0400  NA 135  K 4.7  CL 97  CO2 20  BUN 43*  CREATININE 1.95*  CALCIUM 10.1  GLUCOSE 260*   Lab Results  Component Value Date   CKTOTAL 84 08/01/2013   TROPONINI 3.55* 08/03/2013   Lipid Panel     Component Value Date/Time   CHOL 137 08/05/2013 0042   TRIG 135 08/05/2013 0042   HDL 28* 08/05/2013 0042   CHOLHDL 4.9 08/05/2013 0042   VLDL 27 08/05/2013 0042   LDLCALC 82 08/05/2013 0042    Pro B Natriuretic peptide (  BNP)  Date/Time Value Range Status  07/30/2013  3:57 PM 2555.0* 0 - 450 pg/mL Final  07/19/2013 11:10 PM 1110.0* 0 - 450 pg/mL Final   Lab Results  Component Value Date   HGBA1C 10.4* 07/23/2013   Urinalysis    Component Value Date/Time   COLORURINE YELLOW 07/30/2013 1637   APPEARANCEUR CLEAR 07/30/2013 1637   LABSPEC 1.011 07/30/2013 1637   PHURINE 5.0 07/30/2013 1637   GLUCOSEU NEGATIVE 07/30/2013 1637   HGBUR NEGATIVE 07/30/2013 1637   BILIRUBINUR NEGATIVE 07/30/2013 1637   KETONESUR NEGATIVE 07/30/2013 1637   PROTEINUR NEGATIVE 07/30/2013 1637   UROBILINOGEN 0.2 07/30/2013 1637   NITRITE NEGATIVE 07/30/2013 1637    LEUKOCYTESUR TRACE* 07/30/2013 1637     Radiology: Dg Chest 2 View 08/06/2013   CLINICAL DATA:  History of coronary artery disease, hypertension, and diabetes mellitus with recent stent and pacemaker placement.  EXAM: CHEST  2 VIEW  COMPARISON:  Comparison is made to study of August 03, 2013.  FINDINGS: The lungs are adequately inflated. There are small bilateral pleural effusions layering posteriorly. The cardiac silhouette is mildly enlarged. The pulmonary vascularity is not engorged. The permanent pacemaker appears to be in good position. There is no pneumothorax or pneumomediastinum. The observed portions of the bony thorax appear normal for age.  IMPRESSION: 1. There small bilateral pleural effusions layering posteriorly. There is no overt evidence of pulmonary edema. Low-grade compensated CHF may be present however.  2. There is no evidence of pneumonia.   Electronically Signed   By: David  [place]   On: 08/06/2013 13:10   Dg Chest 2 View 08/03/2013   CLINICAL DATA:  Status post cardiac surgery, chest pain  EXAM: CHEST  2 VIEW  COMPARISON:  07/30/2013  FINDINGS: Stable mild cardiomegaly. Two lead cardiac pacer again identified. Mild to moderate vascular congestion and mild interstitial pulmonary edema. Small bilateral pleural effusions.  IMPRESSION: Evidence of congestive heart failure with mild pulmonary edema slightly worse when compared to the prior study.   Electronically Signed   By: Esperanza Heir M.D.   On: 08/03/2013 12:50   Dg Chest 2 View 07/30/2013   CLINICAL DATA:  Shortness of breath and chest pain.  EXAM: CHEST  2 VIEW  COMPARISON:  Multiple priors  FINDINGS: Cardiac pacing device is unchanged. The cardiomediastinal silhouette is unchanged. No frank edema. Bibasilar opacities are identified, increased on the left. Similar left pleural effusion. Decreased right pleural effusion No pleural effusion or pneumothorax. No acute osseous abnormality.  IMPRESSION: 1. Bibasilar opacities,  increased on the left. Findings represent atelectasis versus infiltrate.  2. Bilateral pleural effusions, similar on the left and decreased on the right.   Electronically Signed   By: Jerene Dilling M.D.   On: 07/30/2013 16:32   Dg Chest 2 View 07/26/2013   CLINICAL DATA:  Decreased breath sounds at the right base.  EXAM: CHEST  2 VIEW  COMPARISON:  Multiple priors  FINDINGS: Cardiac pacing device is unchanged. The cardiomediastinal silhouette is unchanged. No frank edema. Bibasilar opacities. Low lung volumes. No pneumothorax. Bilateral small pleural effusions.  IMPRESSION: Bibasilar opacities, likely representing atelectasis. Developing infection is not excluded. Small bilateral pleural effusions.   Electronically Signed   By: Jerene Dilling M.D.   On: 07/26/2013 09:47   Ct Head Wo Contrast 07/31/2013   CLINICAL DATA:  Stroke.  EXAM: CT HEAD WITHOUT CONTRAST  TECHNIQUE: Contiguous axial images were obtained from the base of the skull through the vertex without intravenous contrast.  COMPARISON:  CT 07/22/2013  FINDINGS: Generalized atrophy. Negative for acute infarct. Negative for hemorrhage or mass.  Mucosal edema in the sphenoid sinus. No acute bony abnormality.  IMPRESSION: No acute intracranial abnormality.   Electronically Signed   By: Marlan Palau M.D.   On: 07/31/2013 21:49   Cardiac Cath:   We then performed rotational atherectomy of the left circumflex using a 1.5 mm burr. The lesions were then predilated with a 2.5 mm compliant balloon. The lesions in the mid and distal vessel were then stented with a 2.5 x 32 mm Promus stent. The lesion in the proximal vessel was then stented in an overlapping fashion with a 2.5 x 24 mm Promus stent. The stents were postdilated with a 2.75 mm noncompliant balloon. Following PCI, there was 0% residual stenosis and TIMI-3 flow. Lesion Data:  Vessel: Left circumflex with sequential lesions in the proximal, mid, and distal vessel.  Percent stenosis  (pre): 95%, 70%, and 90% sequentially.  TIMI-flow (pre): 3  Stent: 2.5 x 32 mm and 2.5 x 24 mm Promus stents  Percent stenosis (post): 0%  TIMI-flow (post): 3  Conclusions: Successful intracoronary stenting of the left circumflex with use of rotational atherectomy for lesion preparation.  Recommendations: Continue dual antiplatelet therapy for one year  EKG: Atrial sensing, ventricular pacing with atrial pacing at times  FOLLOW UP PLANS AND APPOINTMENTS No Known Allergies   Medication List    STOP taking these medications       amLODipine 5 MG tablet  Commonly known as:  NORVASC     cefUROXime 500 MG tablet  Commonly known as:  CEFTIN     insulin aspart 100 UNIT/ML injection  Commonly known as:  novoLOG     insulin glargine 100 UNIT/ML injection  Commonly known as:  LANTUS     omeprazole 20 MG capsule  Commonly known as:  PRILOSEC  Replaced by:  pantoprazole 40 MG tablet     simvastatin 40 MG tablet  Commonly known as:  ZOCOR      TAKE these medications       aspirin 81 MG tablet  Take 81 mg by mouth at bedtime.     atorvastatin 80 MG tablet  Commonly known as:  LIPITOR  Take 1 tablet (80 mg total) by mouth daily at 6 PM.     brimonidine 0.2 % ophthalmic solution  Commonly known as:  ALPHAGAN  Place 1 drop into both eyes 2 (two) times daily.     calcium-vitamin D 500-200 MG-UNIT per tablet  Commonly known as:  OSCAL WITH D  Take 1 tablet by mouth daily with breakfast.     clopidogrel 75 MG tablet  Commonly known as:  PLAVIX  Take 1 tablet (75 mg total) by mouth daily with breakfast.     docusate sodium 100 MG capsule  Commonly known as:  COLACE  Take 1 capsule (100 mg total) by mouth 2 (two) times daily.     furosemide 40 MG tablet  Commonly known as:  LASIX  Take 1 tablet (40 mg total) by mouth daily.     insulin NPH-regular (70-30) 100 UNIT/ML injection  Commonly known as:  NOVOLIN 70/30  Inject 23 Units into the skin 2 (two) times daily with a  meal.     isosorbide mononitrate 30 MG 24 hr tablet  Commonly known as:  IMDUR  Take 1 tablet (30 mg total) by mouth daily.     LORazepam 0.5 MG tablet  Commonly known  as:  ATIVAN  Take 1 tablet (0.5 mg total) by mouth daily as needed. For anxiety     metoprolol tartrate 25 MG tablet  Commonly known as:  LOPRESSOR  Take 1 tablet (25 mg total) by mouth 2 (two) times daily.     MULTIPLE VITAMIN PO  Take 1 tablet by mouth daily.     pantoprazole 40 MG tablet  Commonly known as:  PROTONIX  Take 1 tablet (40 mg total) by mouth 2 (two) times daily before a meal.     polyethylene glycol packet  Commonly known as:  MIRALAX / GLYCOLAX  Take 17 g by mouth daily. OK to increase to BID PRN constipation.     sertraline 100 MG tablet  Commonly known as:  ZOLOFT  Take 100 mg by mouth every morning.     Vitamin D-3 1000 UNITS Caps  Take 1,000 Units by mouth daily.        Discharge Orders   Future Appointments Provider Department Dept Phone   08/16/2013 1:00 PM Luis Abed, MD [facility] [phone]   09/16/2013 1:45 PM Marinus Maw, MD [facility] [phone]   Future Orders Complete By Expires   Amb Referral to Cardiac Rehabilitation  As directed    Diet - low sodium heart healthy  As directed    Diet Carb Modified  As directed    Increase activity slowly  As directed      Follow-up Information   Follow up with Willa Rough, MD On 08/16/2013. (at 1:00 pm)    Specialty:  Cardiology   Contact information:   [address]       BRING ALL MEDICATIONS WITH YOU TO FOLLOW UP APPOINTMENTS  Time spent with patient to include physician time: 47 min Signed: Theodore Demark, PA-C 08/09/2013, 12:05 PM Patient seen and examined. I agree with the assessment and plan as detailed above. See also my additional thoughts below.   Please refer also to my complete progress note from today. I made the decision for the patient to  be discharged from the hospital. I am in agreement with the discharge summary as documented.  Willa Rough, MD, [facility] 08/09/2013 5:06 PM

## 2013-08-09 NOTE — Progress Notes (Signed)
CSW (Clinical Child psychotherapist) prepared pt dc packet and placed with shadow chart. CSW called and arranged non-emergent ambulance transport. Pt, pt family, facility, and nurse all aware. CSW signing off.  Jacqualynn Parco, LCSWA [phone]

## 2013-08-09 NOTE — Progress Notes (Signed)
CSW (Clinical Child psychotherapist) spoke with Elease Hashimoto at [facility]. Pt does have a bed available today. CSW also contacted pt daughter to inform.  Westin Knotts, LCSWA [phone]

## 2013-08-09 NOTE — Progress Notes (Signed)
Pt reports being in severe pain in her rectum.  Her rectum is open and stool can be seen.  Pt given a partial enema 10/30 in the evening but would not allow the nurse to continue the enema d/t pain.  Pt unable to have bowel movement on her own.  Pt would not allow RN to complete the enema or to disimpact initially but after some education she allowed the disimpaction.  Pt only allowed disimpaction for a very short time but it showed RN that there was a large amount of hard stool in the rectum that would need to be manually broke up in order for pt to pass.  Pt only allowing short disimpactions that get out small amounts of stool.  RN will continue pt education and continue to attempt disimpaction as pt allows.  Pt continues to refuse enema.  Pt states multiple times to the RN that she "can't live like this anymore" and "she is going to die."

## 2013-08-09 NOTE — Progress Notes (Signed)
RN attempted disimpaction again and was able to get out a very small amount of stool before the pt asked to stop d/t pain.  Pt placed on bedside commode and able to pass two large hard stools and states the pain has decrease some but she is still sore.

## 2013-08-09 NOTE — Progress Notes (Signed)
Patient ID: Becky Collins, female   DOB: 1932-06-20, 79 y.o.   MRN: [id number]    SUBJECTIVE:    The patient had constipation with impaction yesterday. The nurses notes carefully document multiple attempts were made to disimpact the patient. At times she was too uncomfortable to allow them to continue. Eventually she was able to have 2 large bowel movements. I have started MiraLAX.  She feels much better today. In fact she looks the best today I have seen her in a long time.      Filed Vitals:   08/08/13 1725 08/08/13 1726 08/08/13 2123 08/09/13 0406  BP: 89/60  140/84 143/71  Pulse:  90 72 90  Temp:   97.7 F (36.5 C) 97.4 F (36.3 C)  TempSrc:   Axillary Axillary  Resp:  22    Height:      Weight:    168 lb 3.4 oz (76.3 kg)  SpO2: 100% 99% 100% 100%     Intake/Output Summary (Last 24 hours) at 08/09/13 0704 Last data filed at 08/09/13 0505  Gross per 24 hour  Intake    570 ml  Output    540 ml  Net     30 ml    LABS: Basic Metabolic Panel:  Recent Labs  11/91/47 0516 08/09/13 0400  NA 134* 135  K 4.1 4.7  CL 96 97  CO2 24 20  GLUCOSE 134* 260*  BUN 40* 43*  CREATININE 1.99* 1.95*  CALCIUM 10.0 10.1   Liver Function Tests: No results found for this basename: AST, ALT, ALKPHOS, BILITOT, PROT, ALBUMIN,  in the last 72 hours No results found for this basename: LIPASE, AMYLASE,  in the last 72 hours CBC:  Recent Labs  08/09/13 0400  WBC 14.9*  HGB 11.6*  HCT 36.5  MCV 78.8  PLT 410*   Cardiac Enzymes: No results found for this basename: CKTOTAL, CKMB, CKMBINDEX, TROPONINI,  in the last 72 hours BNP: No components found with this basename: POCBNP,  D-Dimer: No results found for this basename: DDIMER,  in the last 72 hours Hemoglobin A1C: No results found for this basename: HGBA1C,  in the last 72 hours Fasting Lipid Panel: No results found for this basename: CHOL, HDL, LDLCALC, TRIG, CHOLHDL, LDLDIRECT,  in the last 72 hours Thyroid Function Tests: No  results found for this basename: TSH, T4TOTAL, FREET3, T3FREE, THYROIDAB,  in the last 72 hours  RADIOLOGY: Dg Chest 2 View  08/06/2013   CLINICAL DATA:  History of coronary artery disease, hypertension, and diabetes mellitus with recent stent and pacemaker placement.  EXAM: CHEST  2 VIEW  COMPARISON:  Comparison is made to study of August 03, 2013.  FINDINGS: The lungs are adequately inflated. There are small bilateral pleural effusions layering posteriorly. The cardiac silhouette is mildly enlarged. The pulmonary vascularity is not engorged. The permanent pacemaker appears to be in good position. There is no pneumothorax or pneumomediastinum. The observed portions of the bony thorax appear normal for age.  IMPRESSION: 1. There small bilateral pleural effusions layering posteriorly. There is no overt evidence of pulmonary edema. Low-grade compensated CHF may be present however.  2. There is no evidence of pneumonia.   Electronically Signed   By: David  [place]   On: 08/06/2013 13:10   Dg Chest 2 View  08/03/2013   CLINICAL DATA:  Status post cardiac surgery, chest pain  EXAM: CHEST  2 VIEW  COMPARISON:  07/30/2013  FINDINGS: Stable mild cardiomegaly. Two lead cardiac  pacer again identified. Mild to moderate vascular congestion and mild interstitial pulmonary edema. Small bilateral pleural effusions.  IMPRESSION: Evidence of congestive heart failure with mild pulmonary edema slightly worse when compared to the prior study.   Electronically Signed   By: Esperanza Heir M.D.   On: 08/03/2013 12:50   Dg Chest 2 View  07/30/2013   CLINICAL DATA:  Shortness of breath and chest pain.  EXAM: CHEST  2 VIEW  COMPARISON:  Multiple priors  FINDINGS: Cardiac pacing device is unchanged. The cardiomediastinal silhouette is unchanged. No frank edema. Bibasilar opacities are identified, increased on the left. Similar left pleural effusion. Decreased right pleural effusion No pleural effusion or pneumothorax. No acute  osseous abnormality.  IMPRESSION: 1. Bibasilar opacities, increased on the left. Findings represent atelectasis versus infiltrate.  2. Bilateral pleural effusions, similar on the left and decreased on the right.   Electronically Signed   By: Jerene Dilling M.D.   On: 07/30/2013 16:32   Dg Chest 2 View  07/26/2013   CLINICAL DATA:  Decreased breath sounds at the right base.  EXAM: CHEST  2 VIEW  COMPARISON:  Multiple priors  FINDINGS: Cardiac pacing device is unchanged. The cardiomediastinal silhouette is unchanged. No frank edema. Bibasilar opacities. Low lung volumes. No pneumothorax. Bilateral small pleural effusions.  IMPRESSION: Bibasilar opacities, likely representing atelectasis. Developing infection is not excluded. Small bilateral pleural effusions.   Electronically Signed   By: Jerene Dilling M.D.   On: 07/26/2013 09:47   Ct Head Wo Contrast  07/31/2013   CLINICAL DATA:  Stroke.  EXAM: CT HEAD WITHOUT CONTRAST  TECHNIQUE: Contiguous axial images were obtained from the base of the skull through the vertex without intravenous contrast.  COMPARISON:  CT 07/22/2013  FINDINGS: Generalized atrophy. Negative for acute infarct. Negative for hemorrhage or mass.  Mucosal edema in the sphenoid sinus. No acute bony abnormality.  IMPRESSION: No acute intracranial abnormality.   Electronically Signed   By: Marlan Palau M.D.   On: 07/31/2013 21:49   Ct Head Wo Contrast  07/22/2013   CLINICAL DATA:  History of hypertension, diabetes, and heart disease. Confusion.  EXAM: CT HEAD WITHOUT CONTRAST  TECHNIQUE: Contiguous axial images were obtained from the base of the skull through the vertex without contrast.  COMPARISON:  07/20/2013.  FINDINGS: Stable atrophy and chronic microvascular ischemic change. No evidence for acute infarction, hemorrhage, mass lesion, hydrocephalus, or extra-axial fluid. Calvarium intact. Advanced vascular calcification. Minimal sinus fluid is stable. No mastoid inflammatory  process.  IMPRESSION: Stable atrophy and chronic microvascular ischemic change. No acute intracranial findings.   Electronically Signed   By: Davonna Belling M.D.   On: 07/22/2013 18:27   Ct Head Wo Contrast  07/20/2013   *RADIOLOGY REPORT*  Clinical Data: Confusion  CT HEAD WITHOUT CONTRAST  Technique:  Contiguous axial images were obtained from the base of the skull through the vertex without contrast.  Comparison: Prior MRI from 08/16/2011  Findings: Mild prominence of the CSF containing spaces was compatible with generalized atrophy. Scattered and confluent hypodensities within the periventricular deep white matter most compatible with chronic microvascular ischemic disease.  Remote right parietal infarct is noted, unchanged as compared to prior MRI.  No acute intracranial hemorrhage is identified.  There is no large vessel territory infarct.  No extra-axial fluid collection. No midline shift or mass lesion.  Calvarium is intact.  Orbital soft tissues are within normal limits.  Paranasal sinuses and mastoid air cells are clear.  IMPRESSION: 1.  No acute intracranial process.  2. Unchanged remote right parietal infarct.  3. Mild age related atrophy and chronic microvascular ischemic disease, similar to prior.   Original Report Authenticated By: Rise Mu, M.D.    PHYSICAL EXAM    Today patient is oriented to person time and place. Her affect is normal. Lungs reveal a few scattered rhonchi. Cardiac exam reveals S1 and S2. There is no significant peripheral edema.   TELEMETRY:    Patient has a paced rhythm.   ASSESSMENT AND PLAN:     NSTEMI (non-ST elevated myocardial infarction)       The patient ultimately underwent PCI with stenting. She had further chest pain after the procedure. It was felt that her current intermittent ongoing pain is not cardiac. She is on dual antiplatelet therapy.    HTN (hypertension)   HLD (hyperlipidemia)   Carotid artery disease      Ejection fraction      LV function has remained normal.    Mitral regurgitation    Her mitral regurgitation is followed.    History of seizure disorder     She has been off her seizure medication for many months. There have been no documented seizures in the hospital.    CKD (chronic kidney disease), stage III        Creatinine is stable in the range of 1.9. This appears to be her new baseline.    UTI (urinary tract infection)    The patient has now been treated with ampicillin for 9 days. I have stopped her ampicillin. She will need followup urine culture in several days.    Pacemaker    Her pacemaker is in place and functioning properly.    Abnormal mini-mental status exam     Over the last several weeks the patient has been assessed by internal medicine and neurology. It was felt that her mental status had changed somewhat based on being hospitalized and having a urinary tract infection. There is also some mild dementia. She is the best today than I have seen her in weeks.    Chest wall pain    Intermittently she has ongoing chest discomfort. No further cardiac workup will be done.    Acute on chronic diastolic CHF (congestive heart failure)     He has been very difficult to finalize her volume status. Initially when she was admitted there was some volume overload. As we attempt to diuresis her her renal function deteriorates. Some of this was related to recent catheterization contrast. I feel that her current creatinine of 1.9 he is a reasonable new baseline for her at this time. I feel that her current volume status is the most stable that we will be able to obtain.    Type 2 diabetes mellitus with renal manifestations     Diabetes has been managed during the hospitalization.    Constipation     Patient had severe constipation with a significant impaction yesterday. After excellent help from the nurses she was partially disimpacted several times. She then had significant successful bowel movements.    Orthostatic hypotension    History of orthostatic hypotension continues to complicate how far we can push for diuresis.  Nursing home placement   It is my understanding that all the arrangements have been made. We had delayed the discharge. I am hopeful that a bed will be available today. If the bed is available and the patient and family continued to be in agreement, I am hopeful that she  can be discharged to the nursing home today.  Willa Rough 08/09/2013 7:04 AM

## 2013-08-10 ENCOUNTER — Encounter: Payer: Self-pay | Admitting: Cardiology

## 2013-08-14 ENCOUNTER — Encounter: Payer: Self-pay | Admitting: Cardiology

## 2013-08-14 DIAGNOSIS — I5032 Chronic diastolic (congestive) heart failure: Secondary | ICD-10-CM | POA: Insufficient documentation

## 2013-08-16 ENCOUNTER — Encounter: Payer: Self-pay | Admitting: Cardiology

## 2013-08-16 ENCOUNTER — Ambulatory Visit (INDEPENDENT_AMBULATORY_CARE_PROVIDER_SITE_OTHER): Payer: Medicare Other | Admitting: Cardiology

## 2013-08-16 VITALS — BP 101/62 | HR 93 | Ht 65.0 in | Wt 177.0 lb

## 2013-08-16 DIAGNOSIS — N058 Unspecified nephritic syndrome with other morphologic changes: Secondary | ICD-10-CM

## 2013-08-16 DIAGNOSIS — K5900 Constipation, unspecified: Secondary | ICD-10-CM

## 2013-08-16 DIAGNOSIS — R0789 Other chest pain: Secondary | ICD-10-CM

## 2013-08-16 DIAGNOSIS — I340 Nonrheumatic mitral (valve) insufficiency: Secondary | ICD-10-CM

## 2013-08-16 DIAGNOSIS — Z8669 Personal history of other diseases of the nervous system and sense organs: Secondary | ICD-10-CM

## 2013-08-16 DIAGNOSIS — F99 Mental disorder, not otherwise specified: Secondary | ICD-10-CM

## 2013-08-16 DIAGNOSIS — I509 Heart failure, unspecified: Secondary | ICD-10-CM

## 2013-08-16 DIAGNOSIS — Z950 Presence of cardiac pacemaker: Secondary | ICD-10-CM

## 2013-08-16 DIAGNOSIS — R0602 Shortness of breath: Secondary | ICD-10-CM

## 2013-08-16 DIAGNOSIS — I059 Rheumatic mitral valve disease, unspecified: Secondary | ICD-10-CM

## 2013-08-16 DIAGNOSIS — R4182 Altered mental status, unspecified: Secondary | ICD-10-CM

## 2013-08-16 DIAGNOSIS — N390 Urinary tract infection, site not specified: Secondary | ICD-10-CM

## 2013-08-16 DIAGNOSIS — K219 Gastro-esophageal reflux disease without esophagitis: Secondary | ICD-10-CM

## 2013-08-16 DIAGNOSIS — E785 Hyperlipidemia, unspecified: Secondary | ICD-10-CM

## 2013-08-16 DIAGNOSIS — R0989 Other specified symptoms and signs involving the circulatory and respiratory systems: Secondary | ICD-10-CM

## 2013-08-16 DIAGNOSIS — I2510 Atherosclerotic heart disease of native coronary artery without angina pectoris: Secondary | ICD-10-CM

## 2013-08-16 DIAGNOSIS — N183 Chronic kidney disease, stage 3 unspecified: Secondary | ICD-10-CM

## 2013-08-16 DIAGNOSIS — I5032 Chronic diastolic (congestive) heart failure: Secondary | ICD-10-CM

## 2013-08-16 DIAGNOSIS — IMO0002 Reserved for concepts with insufficient information to code with codable children: Secondary | ICD-10-CM

## 2013-08-16 DIAGNOSIS — F32A Depression, unspecified: Secondary | ICD-10-CM

## 2013-08-16 DIAGNOSIS — R071 Chest pain on breathing: Secondary | ICD-10-CM

## 2013-08-16 DIAGNOSIS — I951 Orthostatic hypotension: Secondary | ICD-10-CM

## 2013-08-16 DIAGNOSIS — R9430 Abnormal result of cardiovascular function study, unspecified: Secondary | ICD-10-CM

## 2013-08-16 DIAGNOSIS — F329 Major depressive disorder, single episode, unspecified: Secondary | ICD-10-CM | POA: Insufficient documentation

## 2013-08-16 DIAGNOSIS — E1129 Type 2 diabetes mellitus with other diabetic kidney complication: Secondary | ICD-10-CM

## 2013-08-16 DIAGNOSIS — I10 Essential (primary) hypertension: Secondary | ICD-10-CM

## 2013-08-16 NOTE — Assessment & Plan Note (Signed)
There is chronic diastolic CHF. By physical exam her volume status appears to be stable. I've chosen not to try Ensure diuretics at this time. It is difficult for me to assess her weights here in the office today.

## 2013-08-16 NOTE — Patient Instructions (Signed)
Your physician recommends that you schedule a follow-up appointment in: 4 weeks. Your physician recommends that you continue on your current medications as directed. Please refer to the Current Medication list given to you today. 

## 2013-08-16 NOTE — Assessment & Plan Note (Signed)
She is on a statin. She's had a recent significant intervention. I have considered over time stopping her statin for her aches and pains but I am not convinced it is due to the statin.

## 2013-08-16 NOTE — Assessment & Plan Note (Signed)
The patient had a urinary tract infection that was treated with sensitive to ampicillin in October, 2014 at [facility] hospital. I suggested to Dr. Reuel Boom that he consider a followup urine culture.

## 2013-08-16 NOTE — Assessment & Plan Note (Signed)
I cannot be sure how much depression / dementia plays a role with her problems. In the office she complained of severe shortness of breath. Her O2 sat was normal and she stabilized rapidly after discussing the issue. I cannot assess this issue further.

## 2013-08-16 NOTE — Assessment & Plan Note (Signed)
It now appears that GERD may play a significant role with many of her symptoms. Possibly this is causing her to have shortness of breath also. I discussed this with Dr. Reuel Boom.

## 2013-08-16 NOTE — Assessment & Plan Note (Signed)
She has significant renal insufficiency. Her creatinine at discharge was 1.95.

## 2013-08-16 NOTE — Assessment & Plan Note (Signed)
Ejection fraction was normal on July 22, 2013. She has not had an echo after her coronary intervention that was done electively in the hospital

## 2013-08-16 NOTE — Assessment & Plan Note (Signed)
Patient is a pacemaker in place that works well. Initially was thought that this helped with her shortness of breath.

## 2013-08-16 NOTE — Progress Notes (Signed)
HPI  The patient is seen today after hospitalization at [facility] with the discharge date August 09, 2013. I have spent well over one hour with the visit today. I have reviewed the records from [place]. I have reviewed recent records from [facility] from earlier this week. I have called and spoken to Dr. Donzetta Sprung who will be seeing the patient later this afternoon for primary care visit. I have talked extensively in person with the patient and her daughter.  The patient has complex medical problems. Her cardiac status is complex including coronary disease, chronic diastolic CHF, complete heart block with a pacemaker in place. These issues are stable at this time.  She was discharged from the hospital August 09, 2013. She was then readmitted to [facility]. It was felt that she had significant reflux. Carafate was added to her meds. After reviewing the office today with her daughter, it appears she's not on Carafate at the nursing center. The patient says today that she has her shortness of breath again today. Her complaint is that she is dramatically short of breath. However her O2 sat is normal. After a period of speaking with her she seemed much more stable.  No Known Allergies  Current Outpatient Prescriptions  Medication Sig Dispense Refill  . aspirin 81 MG tablet Take 81 mg by mouth at bedtime.       Marland Kitchen atorvastatin (LIPITOR) 80 MG tablet Take 1 tablet (80 mg total) by mouth daily at 6 PM.  30 tablet  11  . brimonidine (ALPHAGAN) 0.2 % ophthalmic solution Place 1 drop into both eyes 2 (two) times daily.      . calcium-vitamin D (OSCAL WITH D) 500-200 MG-UNIT per tablet Take 1 tablet by mouth daily with breakfast.       . Cholecalciferol (VITAMIN D-3) 1000 UNITS CAPS Take 1,000 Units by mouth daily.       . clopidogrel (PLAVIX) 75 MG tablet Take 1 tablet (75 mg total) by mouth daily with breakfast.  30 tablet  11  . furosemide (LASIX) 40 MG tablet Take 1 tablet (40  mg total) by mouth daily.  30 tablet  11  . insulin NPH-regular (NOVOLIN 70/30) (70-30) 100 UNIT/ML injection Inject 23 Units into the skin 2 (two) times daily with a meal.  10 mL  12  . isosorbide mononitrate (IMDUR) 30 MG 24 hr tablet Take 1 tablet (30 mg total) by mouth daily.  30 tablet  11  . LORazepam (ATIVAN) 0.5 MG tablet Take 1 tablet (0.5 mg total) by mouth daily as needed. For anxiety  15 tablet  0  . metoprolol tartrate (LOPRESSOR) 25 MG tablet Take 1 tablet (25 mg total) by mouth 2 (two) times daily.  60 tablet  3  . MULTIPLE VITAMIN PO Take 1 tablet by mouth daily.       . Nutritional Supplements (PROMOD PO) Take 1 oz by mouth 2 (two) times daily.      . pantoprazole (PROTONIX) 40 MG tablet Take 1 tablet (40 mg total) by mouth 2 (two) times daily before a meal.  60 tablet  11  . sertraline (ZOLOFT) 100 MG tablet Take 100 mg by mouth every morning.      . vitamin C (ASCORBIC ACID) 500 MG tablet Take 500 mg by mouth daily.      Marland Kitchen zinc gluconate 50 MG tablet Take 50 mg by mouth daily.       No current facility-administered medications for this  visit.    History   Social History  . Marital Status: Widowed    Spouse Name: N/A    Number of Children: N/A  . Years of Education: N/A   Occupational History  . Not on file.   Social History Main Topics  . Smoking status: Never Smoker   . Smokeless tobacco: Never Used  . Alcohol Use: No  . Drug Use: No  . Sexual Activity: Not on file   Other Topics Concern  . Not on file   Social History Narrative   Husband died mid 2011-07-31.    Family History  Problem Relation Age of Onset  . Heart attack Father   . Heart attack Mother   . Diabetes Sister   . Hypertension Sister   . Peripheral vascular disease Sister   . Diabetes Brother   . Heart disease Brother   . Hypertension Brother     Past Medical History  Diagnosis Date  . Hypertension     moderate left ventricular hypertrophy  . Diabetes mellitus     h/o  negative exercise stress test/normal LVF assessed June 2011 by echo EF 60-65%  . Hyperlipidemia   . Carotid artery disease     with less than 60% stenosis on the right in 2006  . History of seizure disorder     on depakote  . History of recurrent UTIs   . Chronic edema   . Depression   . Acid reflux disease   . History of medication noncompliance     this has been ongoing for quite sometime  . Complete heart block     a. previously documented Mobitz I 11/12;  b. 06/2013 Found to have CHB--> MDT Adapta L dc PPM, ser #: [id number].  . Stroke     .  Marland Kitchen Moderate mitral regurgitation     a. 05/2013 Echo: EF 55-65%, no rwma, Mod MR.  . CKD (chronic kidney disease), stage III   . Orthostatic hypotension   . Paroxysmal atrial tachycardia   . Normal Ejection Fraction     a. 05/2013 Echo: EF 55-65%.  Marland Kitchen CAD (coronary artery disease)     a. 06/2013 Cath: LM 25, LAD 90m, Diag 25 ost, LCX 21m, 80d, OM1 small, sev diff prox dzs, LPL mod, nl, RCA 100ost 2/ L->R collats.  . Chronic diastolic CHF (congestive heart failure)   . GERD (gastroesophageal reflux disease)   . Constipation     Past Surgical History  Procedure Laterality Date  . Cesarean section  1973  . Pacemaker insertion      Patient Active Problem List   Diagnosis Date Noted  . GERD (gastroesophageal reflux disease)   . Chronic diastolic CHF (congestive heart failure)   . Constipation 08/09/2013  . Chest wall pain 08/04/2013  . Dysphagia 08/04/2013  . Type 2 diabetes mellitus with renal manifestations 08/04/2013  . Odynophagia 08/04/2013  . Abnormal mini-mental status exam 07/22/2013  . CAD (coronary artery disease) 06/26/2013  . Pacemaker 06/26/2013  . Complete heart block 06/26/2013  . Fatigue 06/22/2013  . UTI (urinary tract infection) 06/03/2013  . CKD (chronic kidney disease), stage III   . Orthostatic hypotension   . Ejection fraction   . Mitral regurgitation   . History of seizure disorder   . History of medication  noncompliance   . Hyperkalemia 05/20/2013  . HTN (hypertension) 09/15/2011  . HLD (hyperlipidemia) 09/15/2011  . Carotid artery disease 09/15/2011    ROS   Patient denies fever,  chills, headache, sweats, rash, change in vision, change in hearing, cough, urinary symptoms. She says that she is not constipated at this time.  PHYSICAL EXAM  The patient is oriented to person time and place. With her speech pattern I get the impression that her complaint of shortness of breath is overrepresented. I tried to make it clear to her and her daughter in person. Lungs reveal scattered rhonchi. Cardiac exam reveals S1 and S2. I do not hear any rales. Her abdomen is soft. There is no significant peripheral edema. There are no musculoskeletal deformities. There are no skin rashes.  Filed Vitals:   08/16/13 1304  BP: 101/62  Pulse: 93  Height: 5\' 5"  (1.651 m)  Weight: 177 lb (80.287 kg)  SpO2: 100%     ASSESSMENT & PLAN

## 2013-08-16 NOTE — Assessment & Plan Note (Signed)
Blood pressures control today. No change in therapy. 

## 2013-08-16 NOTE — Assessment & Plan Note (Signed)
She does have mitral regurgitation. It is not severe. No further workup.

## 2013-08-16 NOTE — Assessment & Plan Note (Signed)
Coronary disease is stable today. She had an intervention in October, 2014.

## 2013-08-16 NOTE — Assessment & Plan Note (Signed)
She had severe constipation in the hospital and felt better when she was disimpacted. Today she tells me she is not constipated.

## 2013-08-16 NOTE — Assessment & Plan Note (Signed)
Her diabetes plays a role overall. However it was never out of control in the hospital.

## 2013-08-16 NOTE — Assessment & Plan Note (Signed)
She has a history of orthostatic hypotension. Therefore we cannot push for diuresis too far.

## 2013-08-16 NOTE — Assessment & Plan Note (Signed)
There is history of a seizure disorder in the past. She had been taken off of Depakote several months before October, 2014. She did not appear to have seizures in the hospital.

## 2013-08-16 NOTE — Assessment & Plan Note (Signed)
In the hospital October patient was seen by internal medicine and neurology to help assess her mental status. It was felt that she might have some dementia. It was felt that her overall mental status was probably related to her urinary tract infection and hospitalization and that it should improve.

## 2013-08-16 NOTE — Assessment & Plan Note (Signed)
She has chronic chest wall pain. This is very difficult to differentiate from her ischemic coronary pain.

## 2013-09-16 ENCOUNTER — Ambulatory Visit (INDEPENDENT_AMBULATORY_CARE_PROVIDER_SITE_OTHER): Payer: Medicare Other | Admitting: Internal Medicine

## 2013-09-16 ENCOUNTER — Encounter: Payer: Self-pay | Admitting: Internal Medicine

## 2013-09-16 VITALS — BP 123/85 | HR 79 | Ht 65.0 in | Wt 186.0 lb

## 2013-09-16 DIAGNOSIS — Z950 Presence of cardiac pacemaker: Secondary | ICD-10-CM

## 2013-09-16 DIAGNOSIS — I442 Atrioventricular block, complete: Secondary | ICD-10-CM

## 2013-09-16 DIAGNOSIS — F99 Mental disorder, not otherwise specified: Secondary | ICD-10-CM

## 2013-09-16 DIAGNOSIS — R4182 Altered mental status, unspecified: Secondary | ICD-10-CM

## 2013-09-16 LAB — MDC_IDC_ENUM_SESS_TYPE_INCLINIC
Battery Remaining Longevity: 135 mo
Battery Voltage: 2.79 V
Lead Channel Pacing Threshold Amplitude: 0.75 V
Lead Channel Pacing Threshold Pulse Width: 0.4 ms
Lead Channel Sensing Intrinsic Amplitude: 1.4 mV
Lead Channel Sensing Intrinsic Amplitude: 11.2 mV
Lead Channel Setting Pacing Amplitude: 2 V
Lead Channel Setting Pacing Amplitude: 2.5 V
Lead Channel Setting Pacing Pulse Width: 0.4 ms

## 2013-09-16 NOTE — Assessment & Plan Note (Signed)
Based on her clinical presentation, she appears to be overly sedated. I've asked the patient and her daughter to decrease her benzodiazepines by half and to decrease her MS Contin a half. Hopefully these can be weaned off altogether.

## 2013-09-16 NOTE — Patient Instructions (Signed)
Your physician recommends that you schedule a follow-up appointment in: 9 months  Please take medications as directed.

## 2013-09-16 NOTE — Assessment & Plan Note (Signed)
Interrogation of her [employer]tronic dual-chamber pacemaker demonstrates normal device function. Will follow. There have been no sustained atrial or ventricular arrhythmias.

## 2013-09-16 NOTE — Progress Notes (Signed)
HPI [name] returns today for followup.  She is a 79 year old woman with a history of complete heart block, status post permanent pacemaker insertion, coronary artery disease status post stenting with preserved left ventricular function, with ongoing chest pain, who also has a history of dyslipidemia. The patient has been overly medicated based on her clinical appearance with narcotics and benzodiazepines. She is somnolent, tearful, and complaining of ongoing pain over her chest and back. She is been divided we did on multiple occasions and hospitalized with no obvious etiology. She was thought to have a GI etiology to explain some of her symptoms and is been placed on Carafate. The patient is currently in a nursing facility. She complains of increased dyspnea. At times she appears somnolent, and at other times she appears to be in severe pain and agony. No Known Allergies   Current Outpatient Prescriptions  Medication Sig Dispense Refill  . aspirin 81 MG tablet Take 81 mg by mouth at bedtime.       Marland Kitchen atorvastatin (LIPITOR) 80 MG tablet Take 1 tablet (80 mg total) by mouth daily at 6 PM.  30 tablet  11  . brimonidine (ALPHAGAN) 0.2 % ophthalmic solution Place 1 drop into both eyes 2 (two) times daily.      . calcium-vitamin D (OSCAL WITH D) 500-200 MG-UNIT per tablet Take 1 tablet by mouth daily with breakfast.       . Cholecalciferol (VITAMIN D-3) 1000 UNITS CAPS Take 1,000 Units by mouth daily.       . clopidogrel (PLAVIX) 75 MG tablet Take 1 tablet (75 mg total) by mouth daily with breakfast.  30 tablet  11  . furosemide (LASIX) 40 MG tablet Take 1 tablet (40 mg total) by mouth daily.  30 tablet  11  . insulin NPH-regular (NOVOLIN 70/30) (70-30) 100 UNIT/ML injection Inject 23 Units into the skin 2 (two) times daily with a meal.  10 mL  12  . isosorbide mononitrate (IMDUR) 30 MG 24 hr tablet Take 1 tablet (30 mg total) by mouth daily.  30 tablet  11  . LORazepam (ATIVAN) 0.5 MG  tablet Take 1 tablet (0.5 mg total) by mouth daily as needed. For anxiety  15 tablet  0  . metoprolol tartrate (LOPRESSOR) 25 MG tablet Take 1 tablet (25 mg total) by mouth 2 (two) times daily.  60 tablet  3  . MULTIPLE VITAMIN PO Take 1 tablet by mouth daily.       . Nutritional Supplements (PROMOD PO) Take 1 oz by mouth 2 (two) times daily.      . pantoprazole (PROTONIX) 40 MG tablet Take 1 tablet (40 mg total) by mouth 2 (two) times daily before a meal.  60 tablet  11  . sertraline (ZOLOFT) 100 MG tablet Take 100 mg by mouth every morning.      . vitamin C (ASCORBIC ACID) 500 MG tablet Take 500 mg by mouth daily.      Marland Kitchen zinc gluconate 50 MG tablet Take 50 mg by mouth daily.       No current facility-administered medications for this visit.     Past Medical History  Diagnosis Date  . Hypertension     moderate left ventricular hypertrophy  . Diabetes mellitus     h/o negative exercise stress test/normal LVF assessed June 2011 by echo EF 60-65%  . Hyperlipidemia   . Carotid artery disease     with less than 60% stenosis on the  right in 2006  . History of seizure disorder     on depakote  . History of recurrent UTIs   . Chronic edema   . Depression   . Acid reflux disease   . History of medication noncompliance     this has been ongoing for quite sometime  . Complete heart block     a. previously documented Mobitz I 11/12;  b. 06/2013 Found to have CHB--> MDT Adapta L dc PPM, ser #: [id number].  . Stroke     .  Marland Kitchen Moderate mitral regurgitation     a. 05/2013 Echo: EF 55-65%, no rwma, Mod MR.  . CKD (chronic kidney disease), stage III   . Orthostatic hypotension   . Paroxysmal atrial tachycardia   . Normal Ejection Fraction     a. 05/2013 Echo: EF 55-65%.  Marland Kitchen CAD (coronary artery disease)     a. 06/2013 Cath: LM 25, LAD 16m, Diag 25 ost, LCX 76m, 80d, OM1 small, sev diff prox dzs, LPL mod, nl, RCA 100ost 2/ L->R collats.  . Chronic diastolic CHF (congestive heart failure)   . GERD  (gastroesophageal reflux disease)   . Constipation   . Shortness of breath   . Depression     ROS:   All systems reviewed and negative except as noted in the HPI.   Past Surgical History  Procedure Laterality Date  . Cesarean section  1973  . Pacemaker insertion       Family History  Problem Relation Age of Onset  . Heart attack Father   . Heart attack Mother   . Diabetes Sister   . Hypertension Sister   . Peripheral vascular disease Sister   . Diabetes Brother   . Heart disease Brother   . Hypertension Brother      History   Social History  . Marital Status: Widowed    Spouse Name: N/A    Number of Children: N/A  . Years of Education: N/A   Occupational History  . Not on file.   Social History Main Topics  . Smoking status: Never Smoker   . Smokeless tobacco: Never Used  . Alcohol Use: No  . Drug Use: No  . Sexual Activity: Not on file   Other Topics Concern  . Not on file   Social History Narrative   Husband died mid 07-29-11.     BP 123/85  Pulse 79  Ht 5\' 5"  (1.651 m)  Wt 186 lb (84.369 kg)  BMI 30.95 kg/m2  Physical Exam:  hronically ill appearing, diskempt, NAD HEENT: Unremarkable Neck:  No JVD, no thyromegally Back:  No CVA tenderness Lungs:  Clear except for rare basilar rales. No wheezes or rhonchi, no increased work of breathing, well-healed pacemaker incision. HEART:  Regular rate rhythm, no murmurs, no rubs, no clicks Abd:  soft, positive bowel sounds, no organomegally, no rebound, no guarding Ext:  2 plus pulses, no edema, no cyanosis, no clubbing Skin:  No rashes no nodules Neuro:  CN II through XII intact, motor grossly intact   DEVICE  Normal device function.  See PaceArt for details. ddevice interrogation demonstrates normal function her underlying rhythm is sinus with complete heart block  Assess/Plan:

## 2013-09-17 ENCOUNTER — Encounter: Payer: Self-pay | Admitting: Internal Medicine

## 2013-09-24 ENCOUNTER — Ambulatory Visit (INDEPENDENT_AMBULATORY_CARE_PROVIDER_SITE_OTHER): Payer: Medicare Other | Admitting: Cardiology

## 2013-09-24 ENCOUNTER — Encounter: Payer: Self-pay | Admitting: Cardiology

## 2013-09-24 VITALS — BP 152/75 | HR 79 | Ht 65.0 in | Wt 183.0 lb

## 2013-09-24 DIAGNOSIS — I10 Essential (primary) hypertension: Secondary | ICD-10-CM

## 2013-09-24 DIAGNOSIS — Z950 Presence of cardiac pacemaker: Secondary | ICD-10-CM

## 2013-09-24 DIAGNOSIS — F99 Mental disorder, not otherwise specified: Secondary | ICD-10-CM

## 2013-09-24 DIAGNOSIS — R4182 Altered mental status, unspecified: Secondary | ICD-10-CM

## 2013-09-24 DIAGNOSIS — I2510 Atherosclerotic heart disease of native coronary artery without angina pectoris: Secondary | ICD-10-CM

## 2013-09-24 DIAGNOSIS — I509 Heart failure, unspecified: Secondary | ICD-10-CM

## 2013-09-24 DIAGNOSIS — I5032 Chronic diastolic (congestive) heart failure: Secondary | ICD-10-CM

## 2013-09-24 MED ORDER — MORPHINE SULFATE ER 15 MG PO TBCR: 7.5000 mg | EXTENDED_RELEASE_TABLET | Freq: Two times a day (BID) | ORAL | Status: DC

## 2013-09-24 MED ORDER — MORPHINE SULFATE ER 15 MG PO TBCR: EXTENDED_RELEASE_TABLET | ORAL | Status: DC

## 2013-09-24 NOTE — Assessment & Plan Note (Signed)
Coronary disease is stable. The patient had drug-eluting stent in September, 2014. Shortly after that she had further chest pain. We look cath was not done. It was felt that the pain is not cardiac.

## 2013-09-24 NOTE — Assessment & Plan Note (Signed)
We will continue to reduce her MS Contin

## 2013-09-24 NOTE — Patient Instructions (Signed)
Your physician recommends that you schedule a follow-up appointment in: 3 months. Your physician has recommended you make the following change in your medication: Please take furosemide 80 mg on 09/25/13, then resume previous dose of 40 mg daily. Please decrease your MS Contin to 7.5 mg daily. Please break your 15 mg tablet in half. All other medications will remain the same. Please limit your sodium. Please limit your fluid intake.

## 2013-09-24 NOTE — Assessment & Plan Note (Signed)
Blood pressure is adequately controlled. No change in therapy. 

## 2013-09-24 NOTE — Progress Notes (Signed)
HPI  Patient is seen to followup multiple cardiac issues. I saw her last August 16, 2013. There is a very extensive note from that day. She was seen by Dr. Garner Nash primary care later that day. She is a nursing home. She also was seen by Dr. Ladona Ridgel for electrophysiology followup on December 8. He suggested that her anxiety and pain meds be reduced. Her pacemaker was working well.  The patient is depressed and difficult to assess. Clinically she appears to be stable. She is here with her daughter who has been very attentive and is very knowledgeable and caring.  No Known Allergies  Current Outpatient Prescriptions  Medication Sig Dispense Refill  . aspirin 81 MG tablet Take 81 mg by mouth at bedtime.       Marland Kitchen atorvastatin (LIPITOR) 80 MG tablet Take 1 tablet (80 mg total) by mouth daily at 6 PM.  30 tablet  11  . brimonidine (ALPHAGAN) 0.2 % ophthalmic solution Place 1 drop into both eyes 2 (two) times daily.      . calcium-vitamin D (OSCAL WITH D) 500-200 MG-UNIT per tablet Take 1 tablet by mouth daily with breakfast.       . Cholecalciferol (VITAMIN D-3) 1000 UNITS CAPS Take 1,000 Units by mouth daily.       . clopidogrel (PLAVIX) 75 MG tablet Take 1 tablet (75 mg total) by mouth daily with breakfast.  30 tablet  11  . furosemide (LASIX) 40 MG tablet Take 1 tablet (40 mg total) by mouth daily.  30 tablet  11  . insulin NPH-regular (NOVOLIN 70/30) (70-30) 100 UNIT/ML injection Inject 23 Units into the skin 2 (two) times daily with a meal.  10 mL  12  . isosorbide mononitrate (IMDUR) 30 MG 24 hr tablet Take 1 tablet (30 mg total) by mouth daily.  30 tablet  11  . LORazepam (ATIVAN) 0.5 MG tablet Take 1 tablet (0.5 mg total) by mouth daily as needed. For anxiety  15 tablet  0  . metoprolol tartrate (LOPRESSOR) 25 MG tablet Take 1 tablet (25 mg total) by mouth 2 (two) times daily.  60 tablet  3  . morphine (MS CONTIN) 15 MG 12 hr tablet Take 15 mg by mouth every 12 (twelve) hours.      .  MULTIPLE VITAMIN PO Take 1 tablet by mouth daily.       . Nutritional Supplements (PROMOD PO) Take 1 oz by mouth 2 (two) times daily.      . pantoprazole (PROTONIX) 40 MG tablet Take 1 tablet (40 mg total) by mouth 2 (two) times daily before a meal.  60 tablet  11  . sertraline (ZOLOFT) 100 MG tablet Take 100 mg by mouth every morning.      . sucralfate (CARAFATE) 1 G tablet Take 1 g by mouth 4 (four) times daily -  with meals and at bedtime.      . traZODone (DESYREL) 50 MG tablet Take 25 mg by mouth at bedtime.      . vitamin C (ASCORBIC ACID) 500 MG tablet Take 500 mg by mouth daily.       No current facility-administered medications for this visit.    History   Social History  . Marital Status: Widowed    Spouse Name: N/A    Number of Children: N/A  . Years of Education: N/A   Occupational History  . Not on file.   Social History Main Topics  . Smoking status: Never Smoker   .  Smokeless tobacco: Never Used  . Alcohol Use: No  . Drug Use: No  . Sexual Activity: Not on file   Other Topics Concern  . Not on file   Social History Narrative   Husband died mid 08-Aug-2011.    Family History  Problem Relation Age of Onset  . Heart attack Father   . Heart attack Mother   . Diabetes Sister   . Hypertension Sister   . Peripheral vascular disease Sister   . Diabetes Brother   . Heart disease Brother   . Hypertension Brother     Past Medical History  Diagnosis Date  . Hypertension     moderate left ventricular hypertrophy  . Diabetes mellitus     h/o negative exercise stress test/normal LVF assessed June 2011 by echo EF 60-65%  . Hyperlipidemia   . Carotid artery disease     with less than 60% stenosis on the right in 2006  . History of seizure disorder     on depakote  . History of recurrent UTIs   . Chronic edema   . Depression   . Acid reflux disease   . History of medication noncompliance     this has been ongoing for quite sometime  . Complete heart  block     a. previously documented Mobitz I 11/12;  b. 06/2013 Found to have CHB--> MDT Adapta L dc PPM, ser #: [id number].  . Stroke     .  Marland Kitchen Moderate mitral regurgitation     a. 05/2013 Echo: EF 55-65%, no rwma, Mod MR.  . CKD (chronic kidney disease), stage III   . Orthostatic hypotension   . Paroxysmal atrial tachycardia   . Normal Ejection Fraction     a. 05/2013 Echo: EF 55-65%.  Marland Kitchen CAD (coronary artery disease)     a. 06/2013 Cath: LM 25, LAD 60m, Diag 25 ost, LCX 3m, 80d, OM1 small, sev diff prox dzs, LPL mod, nl, RCA 100ost 2/ L->R collats.  . Chronic diastolic CHF (congestive heart failure)   . GERD (gastroesophageal reflux disease)   . Constipation   . Shortness of breath   . Depression     Past Surgical History  Procedure Laterality Date  . Cesarean section  1973  . Pacemaker insertion      Patient Active Problem List   Diagnosis Date Noted  . GERD (gastroesophageal reflux disease)   . Shortness of breath   . Depression   . Chronic diastolic CHF (congestive heart failure)   . Constipation 08/09/2013  . Chest wall pain 08/04/2013  . Dysphagia 08/04/2013  . Type 2 diabetes mellitus with renal manifestations 08/04/2013  . Odynophagia 08/04/2013  . Abnormal mini-mental status exam 07/22/2013  . CAD (coronary artery disease) 06/26/2013  . Pacemaker 06/26/2013  . Complete heart block 06/26/2013  . Fatigue 06/22/2013  . UTI (urinary tract infection) 06/03/2013  . CKD (chronic kidney disease), stage III   . Orthostatic hypotension   . Ejection fraction   . Mitral regurgitation   . History of seizure disorder   . History of medication noncompliance   . Hyperkalemia 05/20/2013  . HTN (hypertension) 09/15/2011  . HLD (hyperlipidemia) 09/15/2011  . Carotid artery disease 09/15/2011    ROS   Patient denies fever, chills, headache, sweats, rash, change in vision, change in hearing,. She does have some generalized chest pain and abdominal pain that she's had for  months. All other systems are reviewed and are negative.  PHYSICAL EXAM  Patient is oriented to person time and place. Affect reveals that she appears depressed. She is slightly better than when I saw her last. There is no jugulovenous distention. Lungs reveal chronic rales. Abdomen is soft. There is no significant peripheral edema.  Filed Vitals:   09/24/13 1336  BP: 152/75  Pulse: 79  Height: 5\' 5"  (1.651 m)  Weight: 183 lb (83.008 kg)     ASSESSMENT & PLAN

## 2013-09-24 NOTE — Assessment & Plan Note (Signed)
Her pacemaker is working well. No change in therapy 

## 2013-09-24 NOTE — Assessment & Plan Note (Signed)
It is very difficult to manage her overall volume status. In general she tends not to get edema when she is volume overloaded. Her weight in the office on December 12 was 188. It is down to 183 today. I saw her last was 178. She has chronic rales. I am going to recommend 1 day of higher diuretics and then back to her usual dose. Once again she needs to be careful with salt intake and have her overall fluid intake limited.

## 2013-09-26 ENCOUNTER — Encounter: Payer: Medicare Other | Admitting: Internal Medicine

## 2013-12-18 ENCOUNTER — Encounter: Payer: Medicare Other | Admitting: *Deleted

## 2013-12-26 ENCOUNTER — Encounter: Payer: Self-pay | Admitting: *Deleted

## 2013-12-27 ENCOUNTER — Ambulatory Visit (INDEPENDENT_AMBULATORY_CARE_PROVIDER_SITE_OTHER): Payer: Medicare Other | Admitting: Cardiology

## 2013-12-27 ENCOUNTER — Encounter: Payer: Self-pay | Admitting: Cardiology

## 2013-12-27 VITALS — BP 145/83 | HR 73 | Ht 63.0 in | Wt 176.8 lb

## 2013-12-27 DIAGNOSIS — I10 Essential (primary) hypertension: Secondary | ICD-10-CM

## 2013-12-27 DIAGNOSIS — I509 Heart failure, unspecified: Secondary | ICD-10-CM

## 2013-12-27 DIAGNOSIS — Z950 Presence of cardiac pacemaker: Secondary | ICD-10-CM

## 2013-12-27 DIAGNOSIS — I5032 Chronic diastolic (congestive) heart failure: Secondary | ICD-10-CM

## 2013-12-27 DIAGNOSIS — I2510 Atherosclerotic heart disease of native coronary artery without angina pectoris: Secondary | ICD-10-CM

## 2013-12-27 NOTE — Assessment & Plan Note (Signed)
Her pacemaker was placed for complete heart block. It is functioning well.

## 2013-12-27 NOTE — Patient Instructions (Signed)
Your physician recommends that you schedule a follow-up appointment in: 6 months. You will receive a reminder letter in the mail in about 4 months reminding you to call and schedule your appointment. If you don't receive this letter, please contact our office. Your physician recommends that you continue on your current medications as directed. Please refer to the Current Medication list given to you today. Please take and extra furosemide 40 mg at 4:00 pm for 2 days only, then resume previous dose.

## 2013-12-27 NOTE — Progress Notes (Signed)
Patient ID: Becky Collins, female   DOB: [birth year], 80 y.o.   MRN: [id number]    HPI  Patient is seen for followup shortness of breath and coronary disease. She is living part-time in this area and part-time in [place]. She has a doctor in [place]. We will send information. She continues to have some vague chest discomfort. At this time it does not appear to be ischemic pain. She has some shortness of breath when lying down at night. This may be orthopnea. Her pacemaker is in place and working well.  No Known Allergies  Current Outpatient Prescriptions  Medication Sig Dispense Refill  . aspirin 81 MG tablet Take 81 mg by mouth at bedtime.       . brimonidine (ALPHAGAN) 0.2 % ophthalmic solution Place 1 drop into both eyes 2 (two) times daily.      . Cholecalciferol (VITAMIN D-3) 1000 UNITS CAPS Take 1,000 Units by mouth daily.       . clopidogrel (PLAVIX) 75 MG tablet Take 1 tablet (75 mg total) by mouth daily with breakfast.  30 tablet  11  . divalproex (DEPAKOTE) 250 MG DR tablet Take 250 mg by mouth 3 (three) times daily.      . furosemide (LASIX) 40 MG tablet Take 1 tablet (40 mg total) by mouth daily.  30 tablet  11  . isosorbide mononitrate (IMDUR) 30 MG 24 hr tablet Take 1 tablet (30 mg total) by mouth daily.  30 tablet  11  . LANTUS SOLOSTAR 100 UNIT/ML Solostar Pen Inject 30 Units into the skin daily.      . metoprolol tartrate (LOPRESSOR) 25 MG tablet Take 1 tablet (25 mg total) by mouth 2 (two) times daily.  60 tablet  3  . MULTIPLE VITAMIN PO Take 1 tablet by mouth daily.       . pantoprazole (PROTONIX) 40 MG tablet Take 1 tablet (40 mg total) by mouth 2 (two) times daily before a meal.  60 tablet  11  . sertraline (ZOLOFT) 100 MG tablet Take 100 mg by mouth every morning.       No current facility-administered medications for this visit.    History   Social History  . Marital Status: Widowed    Spouse Name: N/A    Number of Children: N/A  . Years of Education: N/A    Occupational History  . Not on file.   Social History Main Topics  . Smoking status: Never Smoker   . Smokeless tobacco: Never Used  . Alcohol Use: No  . Drug Use: No  . Sexual Activity: Not on file   Other Topics Concern  . Not on file   Social History Narrative   Husband died mid October 2012.    Family History  Problem Relation Age of Onset  . Heart attack Father   . Heart attack Mother   . Diabetes Sister   . Hypertension Sister   . Peripheral vascular disease Sister   . Diabetes Brother   . Heart disease Brother   . Hypertension Brother     Past Medical History  Diagnosis Date  . Hypertension     moderate left ventricular hypertrophy  . Diabetes mellitus     h/o negative exercise stress test/normal LVF assessed June 2011 by echo EF 60-65%  . Hyperlipidemia   . Carotid artery disease     with less than 60% stenosis on the right in 2006  . History of seizure disorder  on depakote  . History of recurrent UTIs   . Chronic edema   . Depression   . Acid reflux disease   . History of medication noncompliance     this has been ongoing for quite sometime  . Complete heart block     a. previously documented Mobitz I 11/12;  b. 06/2013 Found to have CHB--> MDT Adapta L dc PPM, ser #: [id number].  . Stroke     .  Marland Kitchen Moderate mitral regurgitation     a. 05/2013 Echo: EF 55-65%, no rwma, Mod MR.  . CKD (chronic kidney disease), stage III   . Orthostatic hypotension   . Paroxysmal atrial tachycardia   . Normal Ejection Fraction     a. 05/2013 Echo: EF 55-65%.  Marland Kitchen CAD (coronary artery disease)     a. 06/2013 Cath: LM 25, LAD 27m, Diag 25 ost, LCX 22m, 80d, OM1 small, sev diff prox dzs, LPL mod, nl, RCA 100ost 2/ L->R collats.  . Chronic diastolic CHF (congestive heart failure)   . GERD (gastroesophageal reflux disease)   . Constipation   . Shortness of breath   . Depression     Past Surgical History  Procedure Laterality Date  . Cesarean section  1973  .  Pacemaker insertion      Patient Active Problem List   Diagnosis Date Noted  . GERD (gastroesophageal reflux disease)   . Shortness of breath   . Depression   . Chronic diastolic CHF (congestive heart failure)   . Constipation 08/09/2013  . Chest wall pain 08/04/2013  . Dysphagia 08/04/2013  . Type 2 diabetes mellitus with renal manifestations 08/04/2013  . Odynophagia 08/04/2013  . Abnormal mini-mental status exam 07/22/2013  . CAD (coronary artery disease) 06/26/2013  . Pacemaker 06/26/2013  . Complete heart block 06/26/2013  . Fatigue 06/22/2013  . UTI (urinary tract infection) 06/03/2013  . CKD (chronic kidney disease), stage III   . Orthostatic hypotension   . Ejection fraction   . Mitral regurgitation   . History of seizure disorder   . History of medication noncompliance   . Hyperkalemia 05/20/2013  . HTN (hypertension) 09/15/2011  . HLD (hyperlipidemia) 09/15/2011  . Carotid artery disease 09/15/2011    ROS   Patient denies fever, chills, headache, sweats, rash, change in vision, change in hearing, nausea vomiting, urinary symptoms. All other systems are reviewed and are negative.  PHYSICAL EXAM  Patient is here with her granddaughter. Patient is oriented to person time and place. There is no jugulovenous distention. Lungs are clear. Respiratory effort is nonlabored. Cardiac exam reveals S1 and S2. Abdomen is soft. There is 1+ peripheral edema.  Filed Vitals:   12/27/13 1342  BP: 145/83  Pulse: 73  Height: 5\' 3"  (1.6 m)  Weight: 176 lb 12.8 oz (80.196 kg)  SpO2: 98%     ASSESSMENT & PLAN

## 2013-12-27 NOTE — Assessment & Plan Note (Signed)
Coronary disease is stable. She had a drug-eluting stent placed in September 2 014. After that time she had further chest pain that was felt to not be cardiac. No further workup at this time.

## 2013-12-27 NOTE — Assessment & Plan Note (Signed)
I do feel that there is mild volume overload at this time. I've encouraged her to take her Lasix twice daily for 2 days and then go back to once daily. She'll be following up with her doctor in [place]. I will send information. I will also send a copy of the note from August 16, 2013. At that time I did an extensive review of her medical status.

## 2013-12-27 NOTE — Assessment & Plan Note (Signed)
Blood pressure is controlled. No change in therapy. 

## 2014-03-12 ENCOUNTER — Encounter: Payer: Self-pay | Admitting: Cardiology

## 2014-06-04 ENCOUNTER — Encounter: Payer: Self-pay | Admitting: Cardiology

## 2014-06-04 ENCOUNTER — Ambulatory Visit (INDEPENDENT_AMBULATORY_CARE_PROVIDER_SITE_OTHER): Payer: Medicare Other | Admitting: Cardiology

## 2014-06-04 VITALS — BP 114/69 | HR 66 | Ht 63.0 in | Wt 158.0 lb

## 2014-06-04 DIAGNOSIS — I340 Nonrheumatic mitral (valve) insufficiency: Secondary | ICD-10-CM

## 2014-06-04 DIAGNOSIS — I509 Heart failure, unspecified: Secondary | ICD-10-CM

## 2014-06-04 DIAGNOSIS — R4182 Altered mental status, unspecified: Secondary | ICD-10-CM

## 2014-06-04 DIAGNOSIS — Z950 Presence of cardiac pacemaker: Secondary | ICD-10-CM

## 2014-06-04 DIAGNOSIS — I2510 Atherosclerotic heart disease of native coronary artery without angina pectoris: Secondary | ICD-10-CM

## 2014-06-04 DIAGNOSIS — I059 Rheumatic mitral valve disease, unspecified: Secondary | ICD-10-CM

## 2014-06-04 DIAGNOSIS — F99 Mental disorder, not otherwise specified: Secondary | ICD-10-CM

## 2014-06-04 DIAGNOSIS — I5032 Chronic diastolic (congestive) heart failure: Secondary | ICD-10-CM

## 2014-06-04 NOTE — Assessment & Plan Note (Signed)
Her volume status is stable. No change in therapy. 

## 2014-06-04 NOTE — Assessment & Plan Note (Signed)
The patient's daughter mentions to me that she does have some dementia. I do not have any further specifics.

## 2014-06-04 NOTE — Assessment & Plan Note (Signed)
The patient does have coronary disease and she received a stent in 2014. Assessment of her chest pain has always been quite difficult. With recent hospitalization her troponins were 0.58, 0.86, 0.59, 0.52, 0.21. This suggested some change and non-STEMI was considered. Her symptoms were lower abdominal. Nuclear scan revealed no significant ischemia. Decision was made to treat her medically and I certainly agree. At this point no change in her cardiac medications and no further workup.

## 2014-06-04 NOTE — Progress Notes (Signed)
Patient ID: Becky Collins, female   DOB: [birth year], 80 y.o.   MRN: [id number]    HPI  Patient is seen today to followup shortness of breath and coronary disease. I saw her last March, 2015. She was admitted to [facility] on May 17, 2014. I have reviewed all of the hospital records carefully. The patient had lower abdominal pain. She had a bowel movement and felt better. However was noted that she had some troponin elevation. There was slight rise and fall in the troponin elevation. It was not completely clear that she had had a non-STEMI. Decision was made to proceed with an in hospital nuclear scan. This revealed good LV function and no significant ischemia. The hospital physicians were in touch with our team. Decision was made for no further in-hospital workup. She's been stable since being at home. She does have dementia as an additional problem.  No Known Allergies  Current Outpatient Prescriptions  Medication Sig Dispense Refill  . aspirin 81 MG tablet Take 81 mg by mouth at bedtime.       Marland Kitchen atorvastatin (LIPITOR) 80 MG tablet Take 1 tablet by mouth daily.      . brimonidine (ALPHAGAN) 0.2 % ophthalmic solution Place 1 drop into both eyes 2 (two) times daily.      . Cholecalciferol (VITAMIN D-3) 1000 UNITS CAPS Take 1,000 Units by mouth daily.       . clopidogrel (PLAVIX) 75 MG tablet Take 1 tablet (75 mg total) by mouth daily with breakfast.  30 tablet  11  . furosemide (LASIX) 40 MG tablet Take 1 tablet (40 mg total) by mouth daily.  30 tablet  11  . isosorbide mononitrate (IMDUR) 30 MG 24 hr tablet Take 1 tablet (30 mg total) by mouth daily.  30 tablet  11  . LANTUS SOLOSTAR 100 UNIT/ML Solostar Pen Inject 30 Units into the skin daily.      . metFORMIN (GLUCOPHAGE) 500 MG tablet Take 1 tablet by mouth 2 (two) times daily.      . metoprolol tartrate (LOPRESSOR) 25 MG tablet Take 1 tablet (25 mg total) by mouth 2 (two) times daily.  60 tablet  3  . MULTIPLE VITAMIN PO Take 1 tablet  by mouth daily.       . pantoprazole (PROTONIX) 40 MG tablet Take 40 mg by mouth daily.      . potassium chloride (MICRO-K) 10 MEQ CR capsule Take 1 capsule by mouth daily.      . sertraline (ZOLOFT) 100 MG tablet Take 100 mg by mouth every morning.       No current facility-administered medications for this visit.    History   Social History  . Marital Status: Widowed    Spouse Name: N/A    Number of Children: N/A  . Years of Education: N/A   Occupational History  . Not on file.   Social History Main Topics  . Smoking status: Never Smoker   . Smokeless tobacco: Never Used  . Alcohol Use: No  . Drug Use: No  . Sexual Activity: Not on file   Other Topics Concern  . Not on file   Social History Narrative   Husband died mid Aug 06, 2011.    Family History  Problem Relation Age of Onset  . Heart attack Father   . Heart attack Mother   . Diabetes Sister   . Hypertension Sister   . Peripheral vascular disease Sister   . Diabetes Brother   .  Heart disease Brother   . Hypertension Brother     Past Medical History  Diagnosis Date  . Hypertension     moderate left ventricular hypertrophy  . Diabetes mellitus     h/o negative exercise stress test/normal LVF assessed June 2011 by echo EF 60-65%  . Hyperlipidemia   . Carotid artery disease     with less than 60% stenosis on the right in 2006  . History of seizure disorder     on depakote  . History of recurrent UTIs   . Chronic edema   . Depression   . Acid reflux disease   . History of medication noncompliance     this has been ongoing for quite sometime  . Complete heart block     a. previously documented Mobitz I 11/12;  b. 06/2013 Found to have CHB--> MDT Adapta L dc PPM, ser #: [id number].  . Stroke     .  Marland Kitchen Moderate mitral regurgitation     a. 05/2013 Echo: EF 55-65%, no rwma, Mod MR.  . CKD (chronic kidney disease), stage III   . Orthostatic hypotension   . Paroxysmal atrial tachycardia   . Normal  Ejection Fraction     a. 05/2013 Echo: EF 55-65%.  Marland Kitchen CAD (coronary artery disease)     a. 06/2013 Cath: LM 25, LAD 15m, Diag 25 ost, LCX 42m, 80d, OM1 small, sev diff prox dzs, LPL mod, nl, RCA 100ost 2/ L->R collats.  . Chronic diastolic CHF (congestive heart failure)   . GERD (gastroesophageal reflux disease)   . Constipation   . Shortness of breath   . Depression     Past Surgical History  Procedure Laterality Date  . Cesarean section  1973  . Pacemaker insertion      Patient Active Problem List   Diagnosis Date Noted  . GERD (gastroesophageal reflux disease)   . Shortness of breath   . Depression   . Chronic diastolic CHF (congestive heart failure)   . Constipation 08/09/2013  . Chest wall pain 08/04/2013  . Dysphagia 08/04/2013  . Type 2 diabetes mellitus with renal manifestations 08/04/2013  . Odynophagia 08/04/2013  . Abnormal mini-mental status exam 07/22/2013  . CAD (coronary artery disease) 06/26/2013  . Pacemaker 06/26/2013  . Complete heart block 06/26/2013  . Fatigue 06/22/2013  . UTI (urinary tract infection) 06/03/2013  . CKD (chronic kidney disease), stage III   . Orthostatic hypotension   . Ejection fraction   . Mitral regurgitation   . History of seizure disorder   . History of medication noncompliance   . Hyperkalemia 05/20/2013  . HTN (hypertension) 09/15/2011  . HLD (hyperlipidemia) 09/15/2011  . Carotid artery disease 09/15/2011    ROS  At this time the patient denies fever, chills, headache, sweats, rash, change in vision, change in hearing, chest pain, cough, nausea or vomiting, urinary symptoms. She says that she feels poorly in general. This is a chronic complaint of hers.   PHYSICAL EXAM   Patient is oriented to person. She is here with 2 of her family members. Head is atraumatic. Sclera and conjunctiva are normal. She has a repetitive motion of rubbing the left side of her face. There is no obvious abnormality on the face. There is no  jugulovenous distention. Lungs are clear. Respiratory effort is nonlabored. Cardiac exam reveals S1 and S2 and a systolic murmur. The abdomen is soft. There is no peripheral edema. There no musculoskeletal deformities. There are no skin rashes.  Filed  Vitals:   06/04/14 1148  BP: 114/69  Pulse: 66  Height:  (1.6 m)  Weight: 158 lb (71.668 kg)  SpO2: 100%      ASSESSMENT & PLAN

## 2014-06-04 NOTE — Patient Instructions (Signed)
Your physician recommends that you schedule a follow-up appointment in: 6 months. You will receive a reminder letter in the mail in about 4 months reminding you to call and schedule your appointment. If you don't receive this letter, please contact our office. Your physician recommends that you continue on your current medications as directed. Please refer to the Current Medication list given to you today. Please schedule your up coming device clinic visit at the [facility] office.

## 2014-06-04 NOTE — Assessment & Plan Note (Signed)
We know that she has moderate mitral regurgitation by echo. No further workup.

## 2014-06-04 NOTE — Assessment & Plan Note (Signed)
Patient received a pacemaker in September, 2014. We are arranging for pacer followup in the [place] office to help with her transportation as it is difficult.  As part of today's evaluation I spent greater than 25 minutes with her total care. More than half of the 25 minutes was spent with direct contact with the patient and her family. Overall I spent an extended amount of time reviewing all of her hospital records as they relate to her visit today.

## 2014-06-25 ENCOUNTER — Encounter (INDEPENDENT_AMBULATORY_CARE_PROVIDER_SITE_OTHER): Payer: Self-pay | Admitting: Ophthalmology

## 2014-08-10 DEATH — deceased

## 2014-08-21 ENCOUNTER — Encounter: Payer: Medicare Other | Admitting: Internal Medicine

## 2014-09-18 ENCOUNTER — Encounter (HOSPITAL_COMMUNITY): Payer: Self-pay | Admitting: Cardiology

## 2014-12-29 IMAGING — CT CT HEAD W/O CM
2 series · 15 of 30 positions shown, 19 images · non-contrast
Comparison: Prior MRI from 08/16/2011

CLINICAL DATA: Confusion

CT HEAD WITHOUT CONTRAST
TECHNIQUE: Contiguous axial images were obtained from the base of
the skull through the vertex without contrast.

[Series 3: head w/o · axial · non-contrast · 0.47mm/px · z∈[+76,+211]mm · 13 of 33 slices shown, 17 images]
[im 3/33  brain]
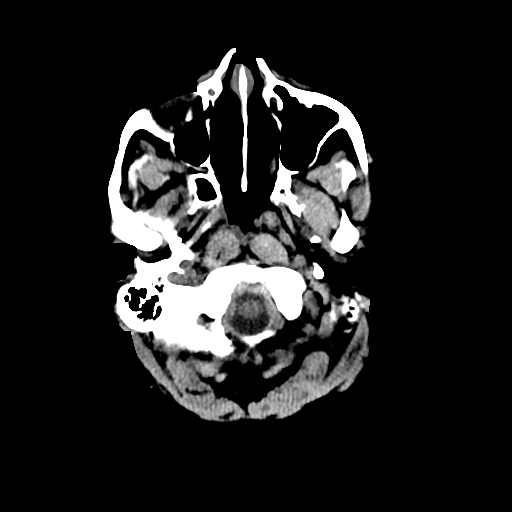
[im 3/33  bone]
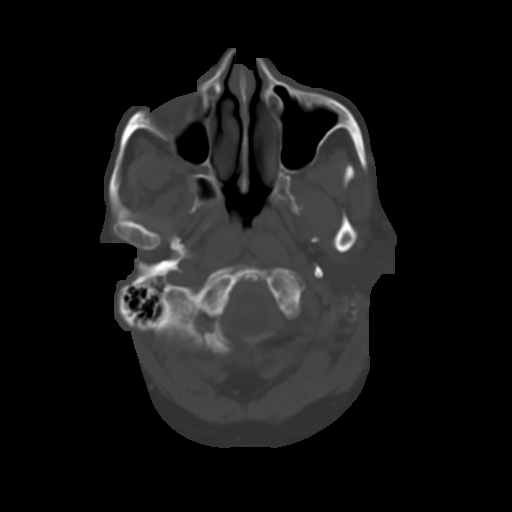
[im 5/33  brain]
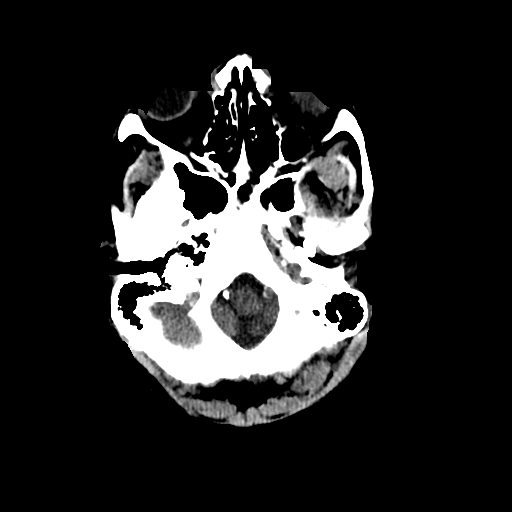
[im 7/33  brain]
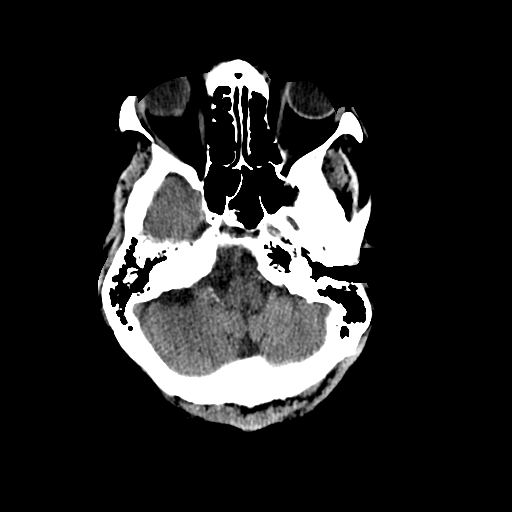
[im 10/33  brain]
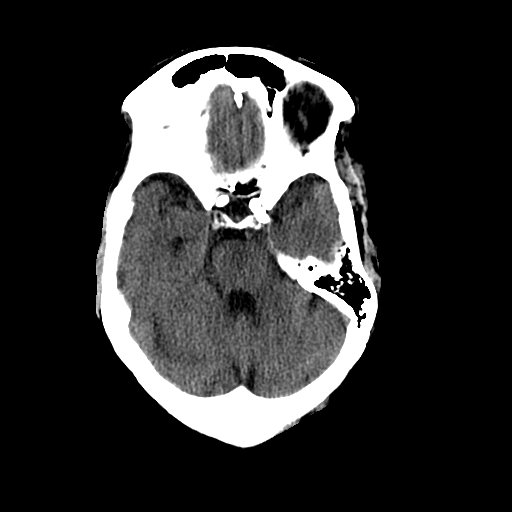
[im 12/33  brain]
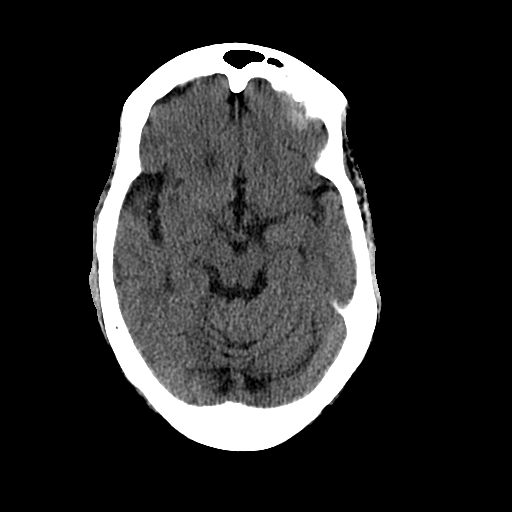
[im 12/33  bone]
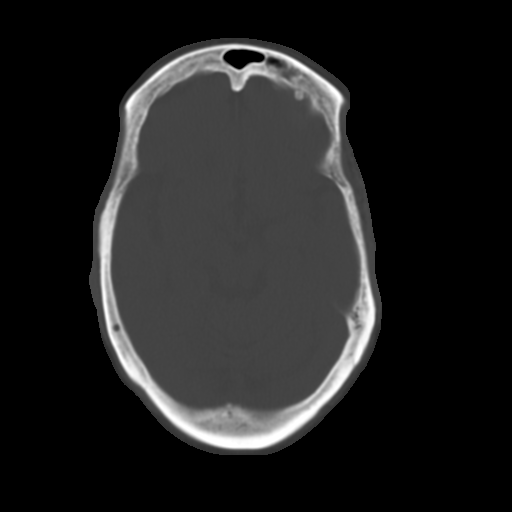
[im 14/33  brain]
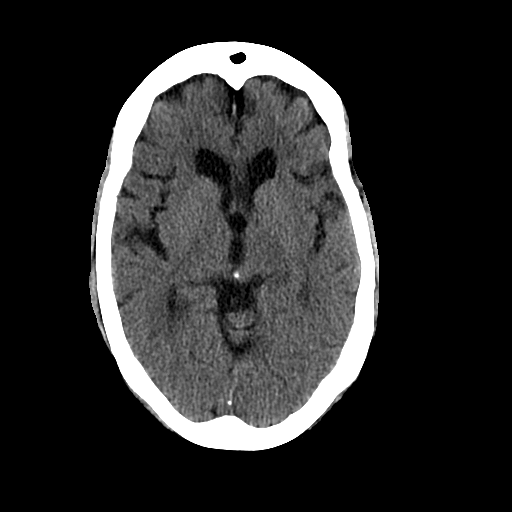
[im 17/33  brain]
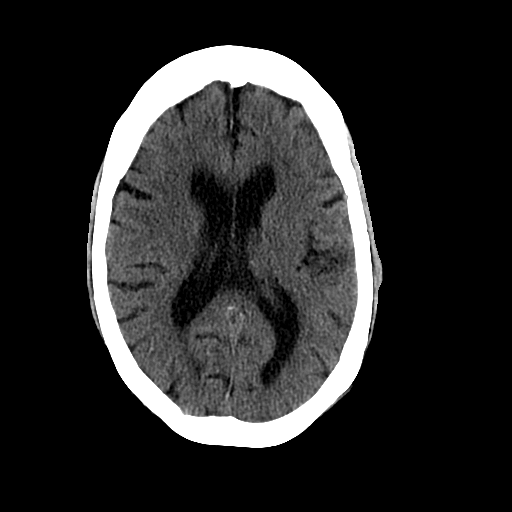
[im 19/33  brain]
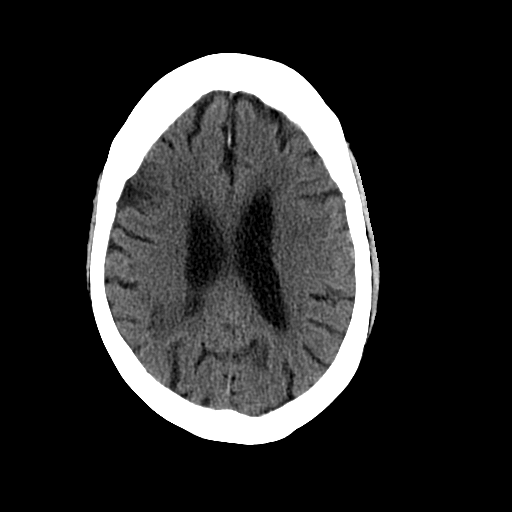
[im 21/33  brain]
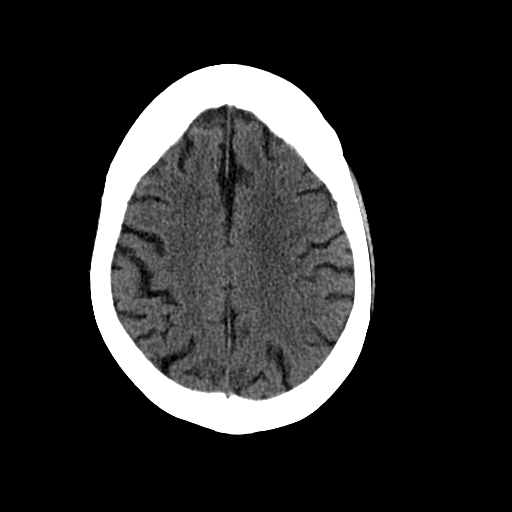
[im 21/33  bone]
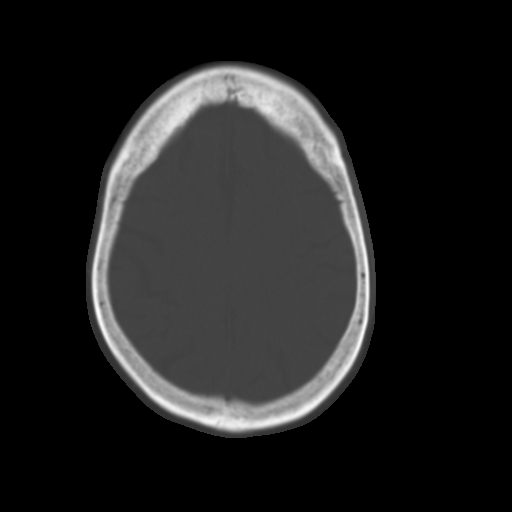
[im 23/33  brain]
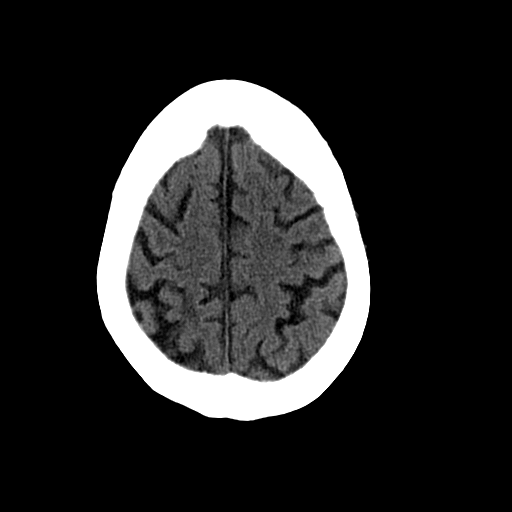
[im 26/33  brain]
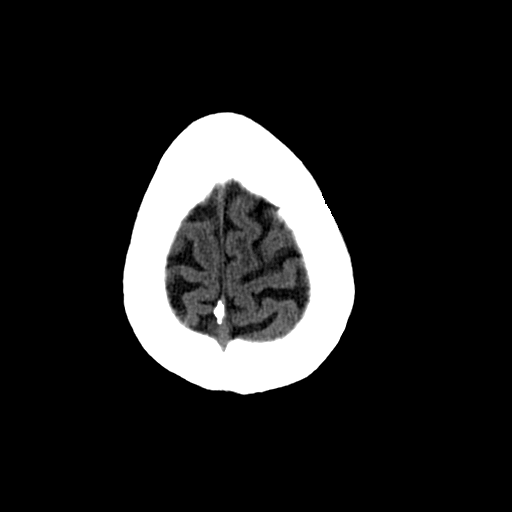
[im 28/33  brain]
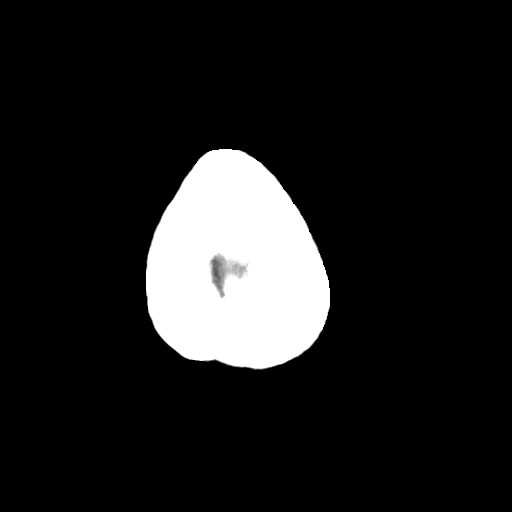
[im 30/33  brain]
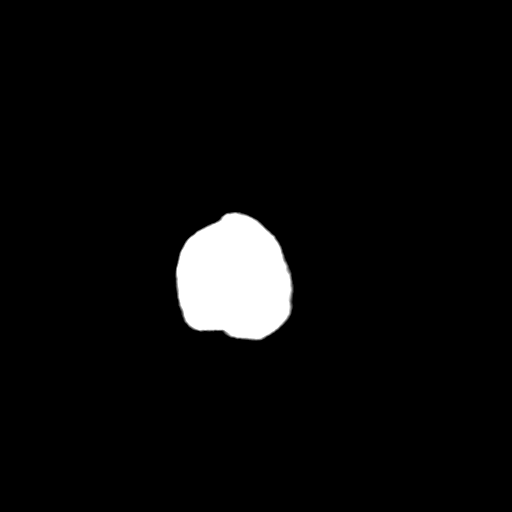
[im 30/33  bone]
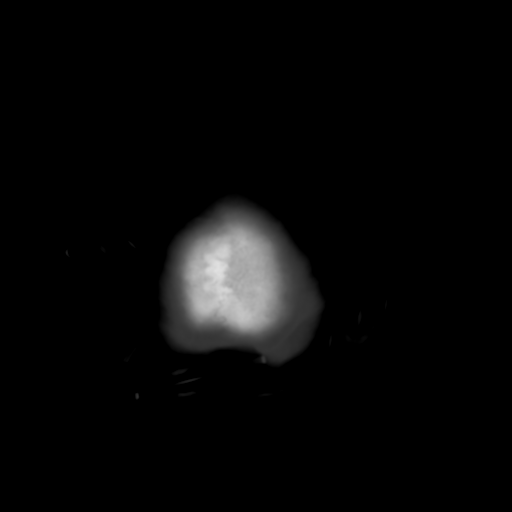

[Series 4: head w/o bone · axial · non-contrast · 0.47mm/px · z∈[+76,+96]mm · 2 of 33 slices shown]
[im 3/33  bone]
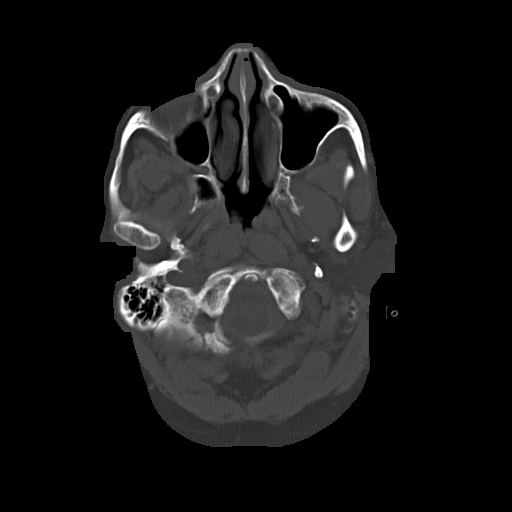
[im 7/33  bone]
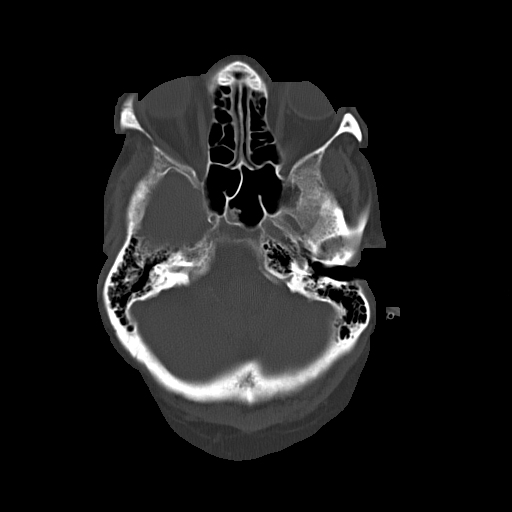

[15 of 30 positions shown; findings below may reference images not displayed]

FINDINGS: Mild prominence of the CSF containing spaces was
compatible with generalized atrophy. Scattered and confluent
hypodensities within the periventricular deep white matter most
compatible with chronic microvascular ischemic disease.  Remote
right parietal infarct is noted, unchanged as compared to prior
MRI.  No acute intracranial hemorrhage is identified.  There is no
large vessel territory infarct.  No extra-axial fluid collection.
No midline shift or mass lesion.

Calvarium is intact.  Orbital soft tissues are within normal
limits.  Paranasal sinuses and mastoid air cells are clear.
IMPRESSION: 1. No acute intracranial process.

2. Unchanged remote right parietal infarct.

3. Mild age related atrophy and chronic microvascular ischemic
disease, similar to prior.

## 2015-01-04 IMAGING — CR DG CHEST 2V
1 series · 1 of 1 positions shown · non-contrast
Comparison: Multiple priors

CLINICAL DATA: Decreased breath sounds at the right base.

EXAM:
CHEST  2 VIEW

[w chest lat]
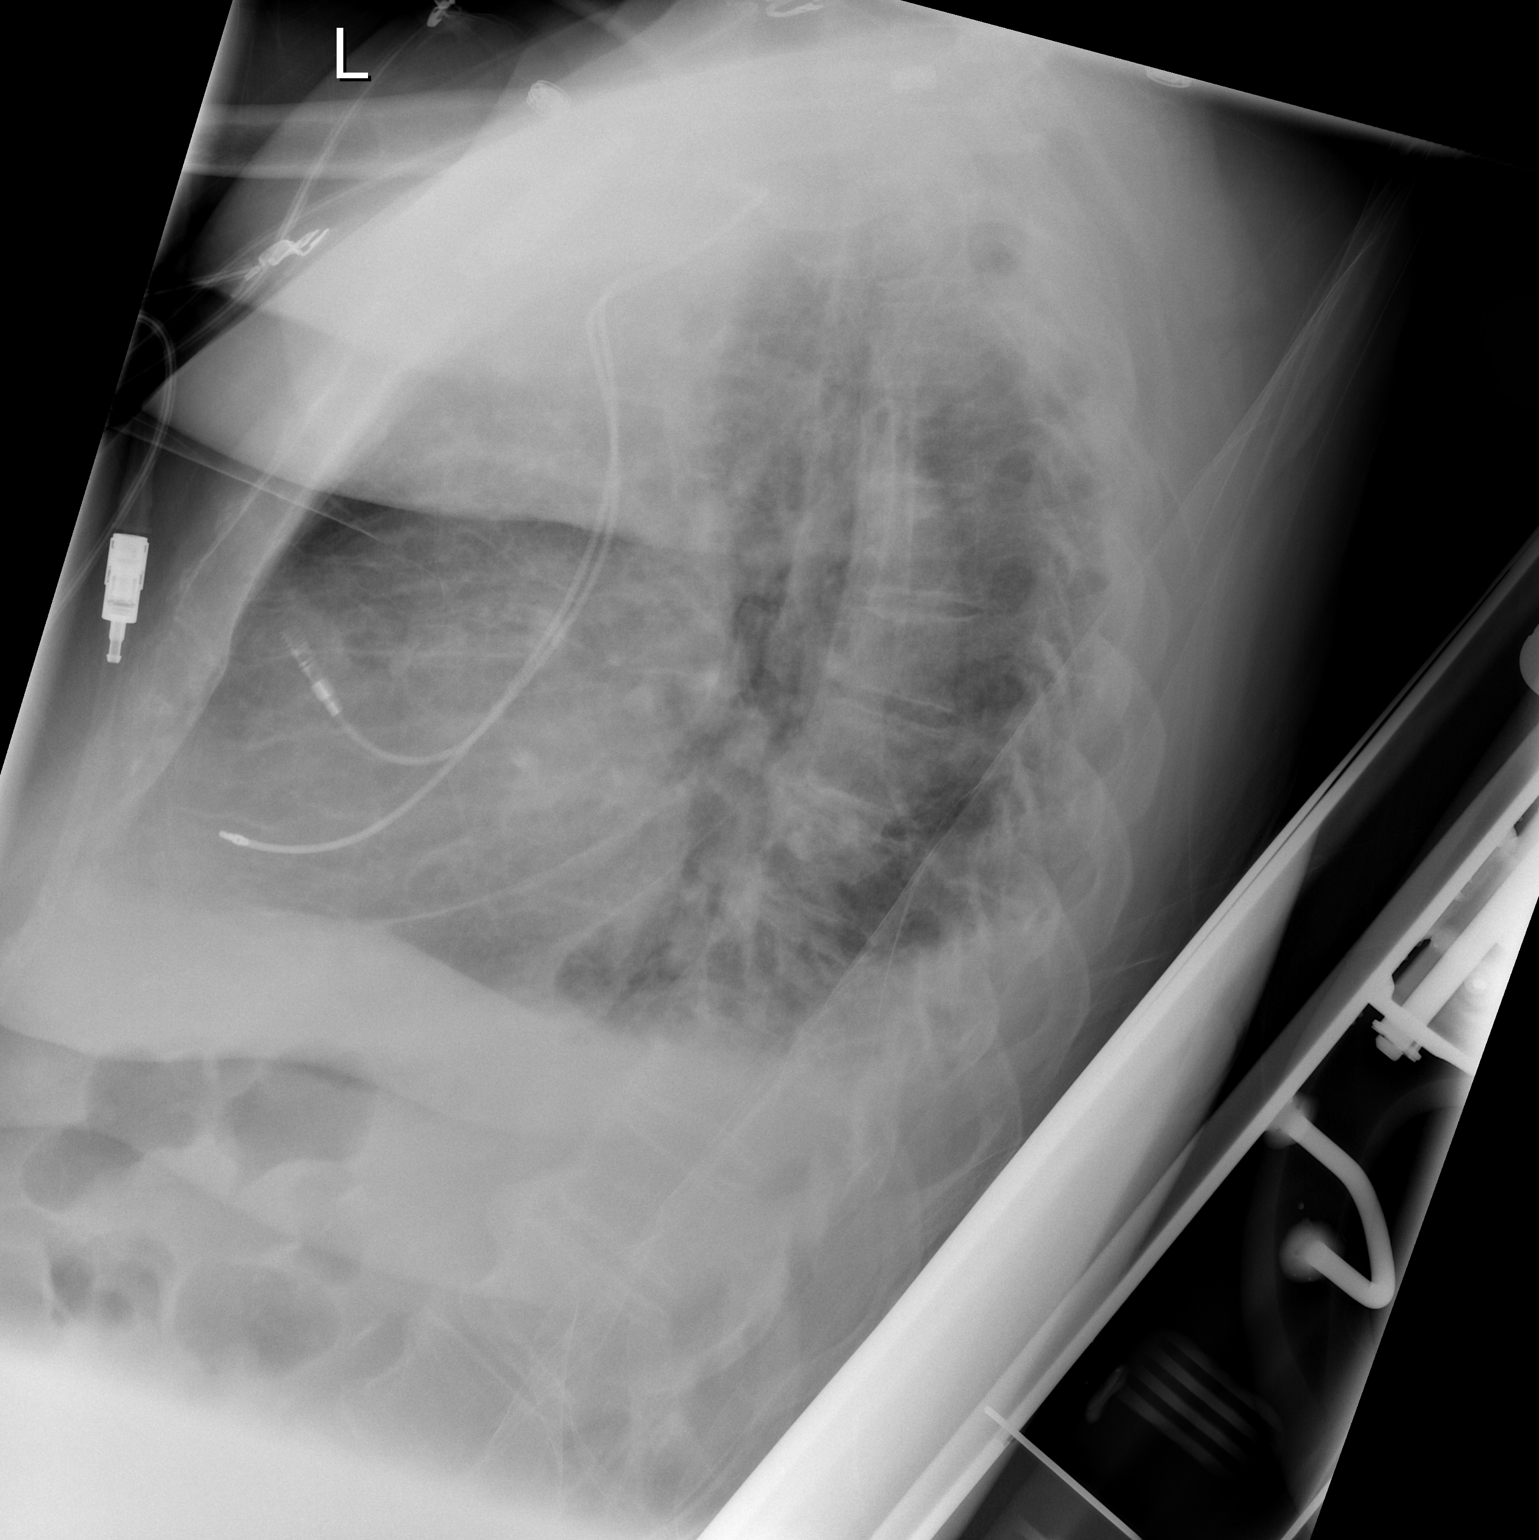

[1 of 1 positions shown; findings below may reference images not displayed]

FINDINGS: Cardiac pacing device is unchanged. The cardiomediastinal silhouette
is unchanged. No frank edema. Bibasilar opacities. Low lung volumes.
No pneumothorax. Bilateral small pleural effusions.
IMPRESSION: Bibasilar opacities, likely representing atelectasis. Developing
infection is not excluded. Small bilateral pleural effusions.
